# Patient Record
Sex: Female | Born: 1950
Health system: Southern US, Community
[De-identification: ages and names within clinical notes are randomized; demographics above are authoritative.]

## PROBLEM LIST (undated history)

## (undated) DIAGNOSIS — M8580 Other specified disorders of bone density and structure, unspecified site: Secondary | ICD-10-CM

## (undated) DIAGNOSIS — J45909 Unspecified asthma, uncomplicated: Secondary | ICD-10-CM

## (undated) DIAGNOSIS — E785 Hyperlipidemia, unspecified: Secondary | ICD-10-CM

## (undated) DIAGNOSIS — I10 Essential (primary) hypertension: Secondary | ICD-10-CM

## (undated) DIAGNOSIS — E079 Disorder of thyroid, unspecified: Secondary | ICD-10-CM

## (undated) DIAGNOSIS — I739 Peripheral vascular disease, unspecified: Secondary | ICD-10-CM

## (undated) DIAGNOSIS — B029 Zoster without complications: Secondary | ICD-10-CM

## (undated) DIAGNOSIS — N6009 Solitary cyst of unspecified breast: Secondary | ICD-10-CM

## (undated) DIAGNOSIS — K219 Gastro-esophageal reflux disease without esophagitis: Secondary | ICD-10-CM

## (undated) HISTORY — DX: Zoster without complications: B02.9

## (undated) HISTORY — PX: TUBAL LIGATION: SHX77

## (undated) HISTORY — DX: Peripheral vascular disease, unspecified: I73.9

## (undated) HISTORY — DX: Other specified disorders of bone density and structure, unspecified site: M85.80

## (undated) HISTORY — DX: Solitary cyst of unspecified breast: N60.09

## (undated) HISTORY — DX: Essential (primary) hypertension: I10

## (undated) HISTORY — PX: ABDOMINAL HYSTERECTOMY: SHX81

## (undated) HISTORY — DX: Gastro-esophageal reflux disease without esophagitis: K21.9

## (undated) HISTORY — DX: Unspecified asthma, uncomplicated: J45.909

## (undated) HISTORY — DX: Disorder of thyroid, unspecified: E07.9

## (undated) HISTORY — PX: BREAST SURGERY: SHX581

## (undated) HISTORY — PX: ARM AMPUTATION: SUR21

## (undated) HISTORY — DX: Hyperlipidemia, unspecified: E78.5

---

## 1999-09-15 ENCOUNTER — Encounter: Admission: RE | Admit: 1999-09-15 | Discharge: 1999-12-14 | Payer: Self-pay | Admitting: Plastic Surgery

## 2000-05-04 HISTORY — PX: OTHER SURGICAL HISTORY: SHX169

## 2000-09-29 ENCOUNTER — Encounter: Admission: RE | Admit: 2000-09-29 | Discharge: 2000-10-26 | Payer: Self-pay | Admitting: Plastic Surgery

## 2000-11-30 ENCOUNTER — Other Ambulatory Visit: Admission: RE | Admit: 2000-11-30 | Discharge: 2000-11-30 | Payer: Self-pay

## 2005-12-22 ENCOUNTER — Ambulatory Visit: Payer: Self-pay | Admitting: Internal Medicine

## 2006-01-21 ENCOUNTER — Ambulatory Visit: Payer: Self-pay | Admitting: Internal Medicine

## 2006-05-25 ENCOUNTER — Ambulatory Visit: Payer: Self-pay | Admitting: Internal Medicine

## 2006-06-24 ENCOUNTER — Other Ambulatory Visit: Admission: RE | Admit: 2006-06-24 | Discharge: 2006-06-24 | Payer: Self-pay | Admitting: Family Medicine

## 2006-07-03 LAB — HM COLONOSCOPY: HM Colonoscopy: NORMAL

## 2006-07-13 ENCOUNTER — Ambulatory Visit: Payer: Self-pay | Admitting: Gastroenterology

## 2006-07-27 ENCOUNTER — Ambulatory Visit: Payer: Self-pay | Admitting: Gastroenterology

## 2006-07-27 ENCOUNTER — Ambulatory Visit (HOSPITAL_COMMUNITY): Admission: RE | Admit: 2006-07-27 | Discharge: 2006-07-27 | Payer: Self-pay | Admitting: Gastroenterology

## 2006-09-22 ENCOUNTER — Ambulatory Visit: Payer: Self-pay | Admitting: Internal Medicine

## 2007-03-25 ENCOUNTER — Ambulatory Visit: Payer: Self-pay | Admitting: Internal Medicine

## 2009-08-07 ENCOUNTER — Ambulatory Visit: Payer: Self-pay | Admitting: Vascular Surgery

## 2010-08-27 ENCOUNTER — Encounter: Payer: Self-pay | Admitting: Nurse Practitioner

## 2010-08-27 DIAGNOSIS — K219 Gastro-esophageal reflux disease without esophagitis: Secondary | ICD-10-CM | POA: Insufficient documentation

## 2010-08-27 DIAGNOSIS — E039 Hypothyroidism, unspecified: Secondary | ICD-10-CM | POA: Insufficient documentation

## 2010-08-27 DIAGNOSIS — I10 Essential (primary) hypertension: Secondary | ICD-10-CM

## 2010-08-27 DIAGNOSIS — J45909 Unspecified asthma, uncomplicated: Secondary | ICD-10-CM | POA: Insufficient documentation

## 2010-08-27 DIAGNOSIS — E785 Hyperlipidemia, unspecified: Secondary | ICD-10-CM | POA: Insufficient documentation
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
## 2012-09-20 ENCOUNTER — Other Ambulatory Visit: Payer: Self-pay | Admitting: Nurse Practitioner

## 2012-09-21 ENCOUNTER — Other Ambulatory Visit: Payer: Self-pay | Admitting: Nurse Practitioner

## 2012-09-21 NOTE — Telephone Encounter (Signed)
Not seen since 01/08/12

## 2012-09-22 ENCOUNTER — Encounter: Payer: Self-pay | Admitting: Physician Assistant

## 2012-09-22 ENCOUNTER — Ambulatory Visit (INDEPENDENT_AMBULATORY_CARE_PROVIDER_SITE_OTHER): Payer: Medicare Other | Admitting: Physician Assistant

## 2012-09-22 ENCOUNTER — Telehealth: Payer: Self-pay | Admitting: Nurse Practitioner

## 2012-09-22 VITALS — BP 134/69 | HR 84 | Temp 97.7°F | Ht 62.0 in | Wt 186.0 lb

## 2012-09-22 DIAGNOSIS — J329 Chronic sinusitis, unspecified: Secondary | ICD-10-CM

## 2012-09-22 MED ORDER — AMOXICILLIN-POT CLAVULANATE 875-125 MG PO TABS: 1.0000 | ORAL_TABLET | Freq: Two times a day (BID) | ORAL | Status: DC

## 2012-09-22 NOTE — Telephone Encounter (Signed)
LAST LABS 9/13 

## 2012-09-22 NOTE — Progress Notes (Signed)
Subjective:     Patient ID: Karla Maxwell, female   DOB: [birth year], 62 y.o.   MRN: [id number]  Sinusitis This is a new problem. The current episode started in the past 7 days. The problem has been gradually worsening since onset. The maximum temperature recorded prior to her arrival was 100 - 100.9 F. The fever has been present for 1 to 2 days. Her pain is at a severity of 5/10. The pain is mild. Associated symptoms include chills, congestion, coughing, ear pain, headaches, neck pain, sinus pressure and a sore throat. Past treatments include antibiotics (ATB form dentist and antihistamines). The treatment provided mild relief.  Pt also with upper tooth pain   Review of Systems  Constitutional: Positive for chills.  HENT: Positive for ear pain, congestion, sore throat, neck pain and sinus pressure.   Respiratory: Positive for cough.   Neurological: Positive for headaches.  All other systems reviewed and are negative.       Objective:   Physical Exam  Constitutional: She appears well-developed and well-nourished.  HENT:  Right Ear: External ear normal.  Left Ear: External ear normal.  Mouth/Throat: Oropharynx is clear and moist. No oropharyngeal exudate.  + TTP maxillary sinus  Neck: Neck supple. No tracheal deviation present.  Cardiovascular: Normal rate, regular rhythm and normal heart sounds.   Pulmonary/Chest: Effort normal and breath sounds normal.  Lymphadenopathy:    She has no cervical adenopathy.       Assessment:     1. Sinusitis        Plan:     Augmentin rx Cont with antihist and add decongest. F/U prn

## 2012-09-22 NOTE — Patient Instructions (Signed)

## 2012-09-22 NOTE — Telephone Encounter (Signed)
PT. GIVEN APPT.

## 2012-10-17 ENCOUNTER — Telehealth: Payer: Self-pay | Admitting: Nurse Practitioner

## 2012-10-17 NOTE — Telephone Encounter (Signed)
appt made

## 2012-10-18 ENCOUNTER — Encounter: Payer: Self-pay | Admitting: Nurse Practitioner

## 2012-10-18 ENCOUNTER — Ambulatory Visit (INDEPENDENT_AMBULATORY_CARE_PROVIDER_SITE_OTHER): Payer: Medicare Other | Admitting: Nurse Practitioner

## 2012-10-18 VITALS — BP 115/61 | HR 65 | Temp 97.8°F | Ht 62.0 in | Wt 184.0 lb

## 2012-10-18 DIAGNOSIS — E039 Hypothyroidism, unspecified: Secondary | ICD-10-CM

## 2012-10-18 DIAGNOSIS — K219 Gastro-esophageal reflux disease without esophagitis: Secondary | ICD-10-CM

## 2012-10-18 DIAGNOSIS — I10 Essential (primary) hypertension: Secondary | ICD-10-CM

## 2012-10-18 DIAGNOSIS — J449 Chronic obstructive pulmonary disease, unspecified: Secondary | ICD-10-CM

## 2012-10-18 DIAGNOSIS — E785 Hyperlipidemia, unspecified: Secondary | ICD-10-CM

## 2012-10-18 LAB — THYROID PANEL WITH TSH
Free Thyroxine Index: 3.5 (ref 1.0–3.9)
TSH: 2.485 u[IU]/mL (ref 0.350–4.500)

## 2012-10-18 MED ORDER — LEVOTHYROXINE SODIUM 50 MCG PO TABS: 50.0000 ug | ORAL_TABLET | Freq: Every day | ORAL | Status: DC

## 2012-10-18 MED ORDER — MONTELUKAST SODIUM 10 MG PO TABS: 10.0000 mg | ORAL_TABLET | Freq: Every day | ORAL | Status: DC

## 2012-10-18 MED ORDER — FENOFIBRATE MICRONIZED 130 MG PO CAPS: 130.0000 mg | ORAL_CAPSULE | Freq: Every day | ORAL | Status: DC

## 2012-10-18 MED ORDER — OMEPRAZOLE 40 MG PO CPDR
40.0000 mg | DELAYED_RELEASE_CAPSULE | Freq: Every day | ORAL | Status: DC
Start: 2012-10-18 — End: 2013-04-14

## 2012-10-18 MED ORDER — BUDESONIDE-FORMOTEROL FUMARATE 160-4.5 MCG/ACT IN AERO: 2.0000 | INHALATION_SPRAY | Freq: Two times a day (BID) | RESPIRATORY_TRACT | Status: DC

## 2012-10-18 MED ORDER — NIACIN ER (ANTIHYPERLIPIDEMIC) 500 MG PO TBCR: 500.0000 mg | EXTENDED_RELEASE_TABLET | Freq: Every day | ORAL | Status: DC

## 2012-10-18 MED ORDER — AMLODIPINE BESYLATE 5 MG PO TABS: 5.0000 mg | ORAL_TABLET | Freq: Every day | ORAL | Status: DC

## 2012-10-18 MED ORDER — ROSUVASTATIN CALCIUM 20 MG PO TABS: 20.0000 mg | ORAL_TABLET | Freq: Every day | ORAL | Status: DC

## 2012-10-18 NOTE — Progress Notes (Signed)
Subjective:    Patient ID: Karla Maxwell, female    DOB: December 28, 1950, 62 y.o.   MRN: [id number]  Hyperlipidemia This is a chronic problem. The current episode started more than 1 year ago. The problem is controlled. Recent lipid tests were reviewed and are normal. Pertinent negatives include no chest pain, focal sensory loss, focal weakness, leg pain, myalgias or shortness of breath. Current antihyperlipidemic treatment includes statins and fibric acid derivatives. The current treatment provides moderate improvement of lipids. Compliance problems include adherence to exercise.  Risk factors for coronary artery disease include hypertension, obesity and post-menopausal.  Hypertension This is a chronic problem. The current episode started more than 1 year ago. The problem has been resolved since onset. The problem is controlled. Pertinent negatives include no blurred vision, chest pain, headaches, malaise/fatigue, orthopnea, palpitations, peripheral edema or shortness of breath. There are no associated agents to hypertension. Risk factors for coronary artery disease include dyslipidemia, obesity and post-menopausal state. Past treatments include calcium channel blockers. The current treatment provides significant improvement. Compliance problems include exercise.  Hypertensive end-organ damage includes a thyroid problem.  Thyroid Problem Presents for follow-up (hypothyrpidism) visit. Patient reports no anxiety, depressed mood, diarrhea, dry skin, fatigue, heat intolerance, leg swelling, palpitations, visual change, weight gain or weight loss. The symptoms have been stable. Her past medical history is significant for hyperlipidemia.   COPD Symbicort and singulair daily- Occassional smoker- 3-5 a day.  Review of Systems  Constitutional: Negative for weight loss, weight gain, malaise/fatigue and fatigue.  Eyes: Negative for blurred vision.  Respiratory: Negative for shortness of breath.   Cardiovascular:  Negative for chest pain, palpitations and orthopnea.  Gastrointestinal: Negative for diarrhea.  Endocrine: Negative for heat intolerance.  Musculoskeletal: Negative for myalgias.  Neurological: Negative for focal weakness and headaches.       Objective:   Physical Exam  Constitutional: She is oriented to person, place, and time. She appears well-developed and well-nourished.  HENT:  Nose: Nose normal.  Mouth/Throat: Oropharynx is clear and moist.  Eyes: EOM are normal.  Neck: Trachea normal, normal range of motion and full passive range of motion without pain. Neck supple. No JVD present. Carotid bruit is not present. No thyromegaly present.  Cardiovascular: Normal rate, regular rhythm, normal heart sounds and intact distal pulses.  Exam reveals no gallop and no friction rub.   No murmur heard. Pulmonary/Chest: Effort normal and breath sounds normal.  Abdominal: Soft. Bowel sounds are normal. She exhibits no distension and no mass. There is no tenderness.  Musculoskeletal: Normal range of motion. She exhibits edema (1+ pitting edema bil).  Lymphadenopathy:    She has no cervical adenopathy.  Neurological: She is alert and oriented to person, place, and time. She has normal reflexes.  Skin: Skin is warm and dry.  Psychiatric: She has a normal mood and affect. Her behavior is normal. Judgment and thought content normal.    BP 115/61  Pulse 65  Temp(Src) 97.8 F (36.6 C) (Oral)  Ht 5\' 2"  (1.575 m)  Wt 184 lb (83.462 kg)  BMI 33.65 kg/m2       Assessment & Plan:   1. Hypertension   2. Hyperlipidemia   3. Hypothyroidism   4. GERD (gastroesophageal reflux disease)   5. COPD with asthma    Orders Placed This Encounter  Procedures  . COMPLETE METABOLIC PANEL WITH GFR  . NMR Lipoprofile with Lipids  . Thyroid Panel With TSH   Meds ordered this encounter  Medications  .  amLODipine (NORVASC) 5 MG tablet    Sig: Take 1 tablet (5 mg total) by mouth daily.    Dispense:  30  tablet    Refill:  5    Order Specific Question:  Supervising Provider    Answer:  Ernestina Penna [[id number]]  . budesonide-formoterol (SYMBICORT) 160-4.5 MCG/ACT inhaler    Sig: Inhale 2 puffs into the lungs 2 (two) times daily.    Dispense:  1 Inhaler    Refill:  5    Order Specific Question:  Supervising Provider    Answer:  Ernestina Penna [[id number]]  . rosuvastatin (CRESTOR) 20 MG tablet    Sig: Take 1 tablet (20 mg total) by mouth daily.    Dispense:  30 tablet    Refill:  5    NTBS    Order Specific Question:  Supervising Provider    Answer:  Ernestina Penna [[id number]]  . fenofibrate micronized (ANTARA) 130 MG capsule    Sig: Take 1 capsule (130 mg total) by mouth daily before breakfast.    Dispense:  30 capsule    Refill:  5    NTBS    Order Specific Question:  Supervising Provider    Answer:  Ernestina Penna [[id number]]  . levothyroxine (SYNTHROID, LEVOTHROID) 50 MCG tablet    Sig: Take 1 tablet (50 mcg total) by mouth daily.    Dispense:  30 tablet    Refill:  5    Order Specific Question:  Supervising Provider    Answer:  Ernestina Penna [[id number]]  . montelukast (SINGULAIR) 10 MG tablet    Sig: Take 1 tablet (10 mg total) by mouth at bedtime.    Dispense:  30 tablet    Refill:  5    Order Specific Question:  Supervising Provider    Answer:  Ernestina Penna [[id number]]  . niacin (NIASPAN) 500 MG CR tablet    Sig: Take 1 tablet (500 mg total) by mouth at bedtime.    Dispense:  30 tablet    Refill:  5    Order Specific Question:  Supervising Provider    Answer:  Ernestina Penna [[id number]]  . omeprazole (PRILOSEC) 40 MG capsule    Sig: Take 1 capsule (40 mg total) by mouth daily.    Dispense:  30 capsule    Refill:  5    Order Specific Question:  Supervising Provider    Answer:  Ernestina Penna [[id number]]   Continue all meds Labs pending Diet and exercise encouraged  Mary-Margaret Daphine Deutscher, FNP

## 2012-10-18 NOTE — Patient Instructions (Signed)

## 2012-10-19 LAB — COMPLETE METABOLIC PANEL WITH GFR
AST: 18 U/L (ref 0–37)
Albumin: 4.8 g/dL (ref 3.5–5.2)
BUN: 19 mg/dL (ref 6–23)
CO2: 26 mEq/L (ref 19–32)
Calcium: 10.1 mg/dL (ref 8.4–10.5)
Chloride: 103 mEq/L (ref 96–112)
Creat: 0.95 mg/dL (ref 0.50–1.10)
GFR, Est African American: 75 mL/min
Potassium: 4.6 mEq/L (ref 3.5–5.3)

## 2012-10-19 LAB — NMR LIPOPROFILE WITH LIPIDS
Cholesterol, Total: 143 mg/dL (ref <200)
HDL Particle Number: 39.7 umol/L (ref >=30.5)
LDL Particle Number: 1314 nmol/L — ABNORMAL HIGH (ref <1000)
LDL Size: 20 nm — ABNORMAL LOW (ref >20.5)
Large VLDL-P: 4.3 nmol/L — ABNORMAL HIGH (ref <=2.7)
Small LDL Particle Number: 846 nmol/L — ABNORMAL HIGH (ref <=527)
VLDL Size: 46.4 nm (ref <=46.6)

## 2013-04-14 ENCOUNTER — Encounter: Payer: Self-pay | Admitting: Nurse Practitioner

## 2013-04-14 ENCOUNTER — Ambulatory Visit (INDEPENDENT_AMBULATORY_CARE_PROVIDER_SITE_OTHER): Payer: Medicare Other | Admitting: Nurse Practitioner

## 2013-04-14 VITALS — BP 137/73 | HR 91 | Temp 97.5°F | Ht 62.0 in | Wt 187.0 lb

## 2013-04-14 DIAGNOSIS — J4489 Other specified chronic obstructive pulmonary disease: Secondary | ICD-10-CM

## 2013-04-14 DIAGNOSIS — E039 Hypothyroidism, unspecified: Secondary | ICD-10-CM

## 2013-04-14 DIAGNOSIS — K219 Gastro-esophageal reflux disease without esophagitis: Secondary | ICD-10-CM

## 2013-04-14 DIAGNOSIS — J45909 Unspecified asthma, uncomplicated: Secondary | ICD-10-CM

## 2013-04-14 DIAGNOSIS — J449 Chronic obstructive pulmonary disease, unspecified: Secondary | ICD-10-CM

## 2013-04-14 DIAGNOSIS — E785 Hyperlipidemia, unspecified: Secondary | ICD-10-CM

## 2013-04-14 DIAGNOSIS — I10 Essential (primary) hypertension: Secondary | ICD-10-CM

## 2013-04-14 MED ORDER — FENOFIBRATE MICRONIZED 130 MG PO CAPS: 130.0000 mg | ORAL_CAPSULE | Freq: Every day | ORAL | Status: DC

## 2013-04-14 MED ORDER — ROSUVASTATIN CALCIUM 20 MG PO TABS: 20.0000 mg | ORAL_TABLET | Freq: Every day | ORAL | Status: DC

## 2013-04-14 MED ORDER — OMEPRAZOLE 40 MG PO CPDR: 40.0000 mg | DELAYED_RELEASE_CAPSULE | Freq: Every day | ORAL | Status: DC

## 2013-04-14 MED ORDER — LEVOTHYROXINE SODIUM 50 MCG PO TABS: 50.0000 ug | ORAL_TABLET | Freq: Every day | ORAL | Status: DC

## 2013-04-14 MED ORDER — AMLODIPINE BESYLATE 5 MG PO TABS: 5.0000 mg | ORAL_TABLET | Freq: Every day | ORAL | Status: DC

## 2013-04-14 MED ORDER — MONTELUKAST SODIUM 10 MG PO TABS: 10.0000 mg | ORAL_TABLET | Freq: Every day | ORAL | Status: DC

## 2013-04-14 MED ORDER — BUDESONIDE-FORMOTEROL FUMARATE 160-4.5 MCG/ACT IN AERO: 2.0000 | INHALATION_SPRAY | Freq: Two times a day (BID) | RESPIRATORY_TRACT | Status: DC

## 2013-04-14 NOTE — Progress Notes (Signed)
Subjective:    Patient ID: Karla Maxwell, female    DOB: 1951-04-18, 62 y.o.   MRN: [id number]  Patient here today for follow up of chronic medical problem- no changes since last visit- no complaints.  Hyperlipidemia This is a chronic problem. The current episode started more than 1 year ago. The problem is controlled. Recent lipid tests were reviewed and are normal. Pertinent negatives include no chest pain, focal sensory loss, focal weakness, leg pain, myalgias or shortness of breath. Current antihyperlipidemic treatment includes statins and fibric acid derivatives. The current treatment provides moderate improvement of lipids. Compliance problems include adherence to exercise.  Risk factors for coronary artery disease include hypertension, obesity and post-menopausal.  Hypertension This is a chronic problem. The current episode started more than 1 year ago. The problem has been resolved since onset. The problem is controlled. Pertinent negatives include no blurred vision, chest pain, headaches, malaise/fatigue, orthopnea, palpitations, peripheral edema or shortness of breath. There are no associated agents to hypertension. Risk factors for coronary artery disease include dyslipidemia, obesity and post-menopausal state. Past treatments include calcium channel blockers. The current treatment provides significant improvement. Compliance problems include exercise.  Hypertensive end-organ damage includes a thyroid problem.  Thyroid Problem Presents for follow-up (hypothyrpidism) visit. Patient reports no anxiety, depressed mood, diarrhea, dry skin, fatigue, heat intolerance, leg swelling, palpitations, visual change, weight gain or weight loss. The symptoms have been stable. Her past medical history is significant for hyperlipidemia.  bronchial asthma Symbicort and singulair daily- Occassional smoker- 3-5 a day.  Review of Systems  Constitutional: Negative for weight loss, weight gain, malaise/fatigue  and fatigue.  Eyes: Negative for blurred vision.  Respiratory: Negative for shortness of breath.   Cardiovascular: Negative for chest pain, palpitations and orthopnea.  Gastrointestinal: Negative for diarrhea.  Endocrine: Negative for heat intolerance.  Musculoskeletal: Negative for myalgias.  Neurological: Negative for focal weakness and headaches.       Objective:   Physical Exam  Constitutional: She is oriented to person, place, and time. She appears well-developed and well-nourished.  HENT:  Nose: Nose normal.  Mouth/Throat: Oropharynx is clear and moist.  Eyes: EOM are normal.  Neck: Trachea normal, normal range of motion and full passive range of motion without pain. Neck supple. No JVD present. Carotid bruit is not present. No thyromegaly present.  Cardiovascular: Normal rate, regular rhythm, normal heart sounds and intact distal pulses.  Exam reveals no gallop and no friction rub.   No murmur heard. Pulmonary/Chest: Effort normal and breath sounds normal.  Abdominal: Soft. Bowel sounds are normal. She exhibits no distension and no mass. There is no tenderness.  Musculoskeletal: Normal range of motion. She exhibits edema (1+ pitting edema bil).  Lymphadenopathy:    She has no cervical adenopathy.  Neurological: She is alert and oriented to person, place, and time. She has normal reflexes.  Skin: Skin is warm and dry.  Psychiatric: She has a normal mood and affect. Her behavior is normal. Judgment and thought content normal.    BP 137/73  Pulse 91  Temp(Src) 97.5 F (36.4 C) (Oral)  Ht 5\' 2"  (1.575 m)  Wt 187 lb (84.823 kg)  BMI 34.19 kg/m2       Assessment & Plan:   1. Hypothyroidism   2. Hypertension   3. Hyperlipidemia   4. GERD (gastroesophageal reflux disease)   5. Asthma, chronic   6. COPD with asthma    Orders Placed This Encounter  Procedures  . CMP14+EGFR  . NMR,  lipoprofile  . Thyroid Panel With TSH   Meds ordered this encounter  Medications   . amLODipine (NORVASC) 5 MG tablet    Sig: Take 1 tablet (5 mg total) by mouth daily.    Dispense:  90 tablet    Refill:  1    Order Specific Question:  Supervising Provider    Answer:  Ernestina Penna [[id number]]  . budesonide-formoterol (SYMBICORT) 160-4.5 MCG/ACT inhaler    Sig: Inhale 2 puffs into the lungs 2 (two) times daily.    Dispense:  1 Inhaler    Refill:  5    Order Specific Question:  Supervising Provider    Answer:  Ernestina Penna [[id number]]  . levothyroxine (SYNTHROID, LEVOTHROID) 50 MCG tablet    Sig: Take 1 tablet (50 mcg total) by mouth daily.    Dispense:  90 tablet    Refill:  1    Order Specific Question:  Supervising Provider    Answer:  Ernestina Penna [[id number]]  . montelukast (SINGULAIR) 10 MG tablet    Sig: Take 1 tablet (10 mg total) by mouth at bedtime.    Dispense:  90 tablet    Refill:  1    Order Specific Question:  Supervising Provider    Answer:  Ernestina Penna [[id number]]  . omeprazole (PRILOSEC) 40 MG capsule    Sig: Take 1 capsule (40 mg total) by mouth daily.    Dispense:  90 capsule    Refill:  1    Order Specific Question:  Supervising Provider    Answer:  Ernestina Penna [[id number]]  . rosuvastatin (CRESTOR) 20 MG tablet    Sig: Take 1 tablet (20 mg total) by mouth daily.    Dispense:  30 tablet    Refill:  5    Order Specific Question:  Supervising Provider    Answer:  Ernestina Penna [[id number]]  . fenofibrate micronized (ANTARA) 130 MG capsule    Sig: Take 1 capsule (130 mg total) by mouth daily before breakfast.    Dispense:  30 capsule    Refill:  5    Order Specific Question:  Supervising Provider    Answer:  Deborra Medina    Continue all meds Labs pending Diet and exercise encouraged Health maintenance reviewed Follow up in 3 month Refuses immunizations Mary-Margaret Daphine Deutscher, FNP

## 2013-04-14 NOTE — Patient Instructions (Signed)

## 2013-04-16 LAB — NMR, LIPOPROFILE
Cholesterol: 233 mg/dL — ABNORMAL HIGH (ref <200)
HDL Cholesterol by NMR: 45 mg/dL (ref >=40)
HDL Particle Number: 38.1 umol/L (ref >=30.5)
LDLC SERPL CALC-MCNC: 144 mg/dL — ABNORMAL HIGH (ref <100)
Triglycerides by NMR: 219 mg/dL — ABNORMAL HIGH (ref <150)

## 2013-04-16 LAB — CMP14+EGFR
Albumin: 4.6 g/dL (ref 3.6–4.8)
BUN: 14 mg/dL (ref 8–27)
CO2: 23 mmol/L (ref 18–29)
Calcium: 10.2 mg/dL (ref 8.6–10.2)
Creatinine, Ser: 0.95 mg/dL (ref 0.57–1.00)
GFR calc non Af Amer: 64 mL/min/{1.73_m2} (ref >59)
Globulin, Total: 2.4 g/dL (ref 1.5–4.5)

## 2013-04-16 LAB — THYROID PANEL WITH TSH: Free Thyroxine Index: 2.4 (ref 1.2–4.9)

## 2013-04-16 LAB — SPECIMEN STATUS REPORT

## 2013-04-25 ENCOUNTER — Other Ambulatory Visit: Payer: Self-pay | Admitting: Nurse Practitioner

## 2013-10-20 ENCOUNTER — Other Ambulatory Visit: Payer: Self-pay | Admitting: Nurse Practitioner

## 2013-10-23 ENCOUNTER — Other Ambulatory Visit: Payer: Self-pay | Admitting: Nurse Practitioner

## 2013-11-17 ENCOUNTER — Ambulatory Visit (INDEPENDENT_AMBULATORY_CARE_PROVIDER_SITE_OTHER): Payer: Medicare Other | Admitting: Nurse Practitioner

## 2013-11-17 ENCOUNTER — Encounter: Payer: Self-pay | Admitting: Nurse Practitioner

## 2013-11-17 VITALS — BP 142/68 | HR 78 | Temp 98.3°F | Ht 62.0 in | Wt 187.0 lb

## 2013-11-17 DIAGNOSIS — J45909 Unspecified asthma, uncomplicated: Secondary | ICD-10-CM

## 2013-11-17 DIAGNOSIS — E039 Hypothyroidism, unspecified: Secondary | ICD-10-CM

## 2013-11-17 DIAGNOSIS — Z713 Dietary counseling and surveillance: Secondary | ICD-10-CM

## 2013-11-17 DIAGNOSIS — E785 Hyperlipidemia, unspecified: Secondary | ICD-10-CM

## 2013-11-17 DIAGNOSIS — J4540 Moderate persistent asthma, uncomplicated: Secondary | ICD-10-CM

## 2013-11-17 DIAGNOSIS — I10 Essential (primary) hypertension: Secondary | ICD-10-CM

## 2013-11-17 DIAGNOSIS — K219 Gastro-esophageal reflux disease without esophagitis: Secondary | ICD-10-CM

## 2013-11-17 DIAGNOSIS — Z6837 Body mass index (BMI) 37.0-37.9, adult: Secondary | ICD-10-CM

## 2013-11-17 MED ORDER — BUDESONIDE-FORMOTEROL FUMARATE 160-4.5 MCG/ACT IN AERO: INHALATION_SPRAY | RESPIRATORY_TRACT | Status: DC

## 2013-11-17 MED ORDER — NIACIN ER (ANTIHYPERLIPIDEMIC) 500 MG PO TBCR: EXTENDED_RELEASE_TABLET | ORAL | Status: DC

## 2013-11-17 MED ORDER — FENOFIBRATE MICRONIZED 130 MG PO CAPS: ORAL_CAPSULE | ORAL | Status: DC

## 2013-11-17 MED ORDER — OMEPRAZOLE 40 MG PO CPDR: DELAYED_RELEASE_CAPSULE | ORAL | Status: DC

## 2013-11-17 MED ORDER — LEVOTHYROXINE SODIUM 50 MCG PO TABS: 50.0000 ug | ORAL_TABLET | Freq: Every day | ORAL | Status: DC

## 2013-11-17 MED ORDER — ROSUVASTATIN CALCIUM 20 MG PO TABS: ORAL_TABLET | ORAL | Status: DC

## 2013-11-17 MED ORDER — AMLODIPINE BESYLATE 5 MG PO TABS: ORAL_TABLET | ORAL | Status: DC

## 2013-11-17 MED ORDER — MONTELUKAST SODIUM 10 MG PO TABS: ORAL_TABLET | ORAL | Status: DC

## 2013-11-17 NOTE — Patient Instructions (Signed)

## 2013-11-17 NOTE — Progress Notes (Signed)
Subjective:    Patient ID: Karla Maxwell, female    DOB: 1950/12/15, 63 y.o.   MRN: [id number]  Patient here today for follow up of chronic medical problem- no changes since last visit- no complaints.  Hyperlipidemia This is a chronic problem. The current episode started more than 1 year ago. The problem is controlled. Recent lipid tests were reviewed and are normal. Pertinent negatives include no chest pain, focal sensory loss, focal weakness, leg pain, myalgias or shortness of breath. Current antihyperlipidemic treatment includes statins and fibric acid derivatives. The current treatment provides moderate improvement of lipids. Compliance problems include adherence to exercise.  Risk factors for coronary artery disease include hypertension, obesity and post-menopausal.  Hypertension This is a chronic problem. The current episode started more than 1 year ago. The problem has been resolved since onset. The problem is controlled. Pertinent negatives include no blurred vision, chest pain, headaches, malaise/fatigue, orthopnea, palpitations, peripheral edema or shortness of breath. There are no associated agents to hypertension. Risk factors for coronary artery disease include dyslipidemia, obesity and post-menopausal state. Past treatments include calcium channel blockers. The current treatment provides significant improvement. Compliance problems include exercise.  Hypertensive end-organ damage includes a thyroid problem.  Gastrophageal Reflux She reports no chest pain. Pertinent negatives include no fatigue or weight loss.  Thyroid Problem Presents for follow-up (hypothyrpidism) visit. Patient reports no anxiety, depressed mood, diarrhea, dry skin, fatigue, heat intolerance, leg swelling, palpitations, visual change, weight gain or weight loss. The symptoms have been stable. Her past medical history is significant for hyperlipidemia.  bronchial asthma Symbicort and singulair daily- Occassional  smoker- 3-5 a day. Has not needed albuterol in a few months  Review of Systems  Constitutional: Negative for weight loss, weight gain, malaise/fatigue and fatigue.  Eyes: Negative for blurred vision.  Respiratory: Negative for shortness of breath.   Cardiovascular: Negative for chest pain, palpitations and orthopnea.  Gastrointestinal: Negative for diarrhea.  Endocrine: Negative for heat intolerance.  Musculoskeletal: Negative for myalgias.  Neurological: Negative for focal weakness and headaches.       Objective:   Physical Exam  Constitutional: She is oriented to person, place, and time. She appears well-developed and well-nourished.  HENT:  Nose: Nose normal.  Mouth/Throat: Oropharynx is clear and moist.  Eyes: EOM are normal.  Neck: Trachea normal, normal range of motion and full passive range of motion without pain. Neck supple. No JVD present. Carotid bruit is not present. No thyromegaly present.  Cardiovascular: Normal rate, regular rhythm, normal heart sounds and intact distal pulses.  Exam reveals no gallop and no friction rub.   No murmur heard. Pulmonary/Chest: Effort normal and breath sounds normal.  Abdominal: Soft. Bowel sounds are normal. She exhibits no distension and no mass. There is no tenderness.  Musculoskeletal: Normal range of motion. She exhibits edema (1+ pitting edema bil).  Lymphadenopathy:    She has no cervical adenopathy.  Neurological: She is alert and oriented to person, place, and time. She has normal reflexes.  Skin: Skin is warm and dry.  Psychiatric: She has a normal mood and affect. Her behavior is normal. Judgment and thought content normal.    BP 142/68  Pulse 78  Temp(Src) 98.3 F (36.8 C) (Oral)  Ht '5\' 2"'  (1.575 m)  Wt 187 lb (84.823 kg)  BMI 34.19 kg/m2       Assessment & Plan:    1. Essential hypertension   2. Hyperlipidemia   3. Hypothyroidism, unspecified hypothyroidism type   4. Gastroesophageal  reflux disease,  esophagitis presence not specified   5. Bronchial asthma, moderate persistent, uncomplicated   6. BMI 37.0-37.9, adult   7. Weight loss counseling, encounter for     Orders Placed This Encounter  Procedures  . CMP14+EGFR  . NMR, lipoprofile  . Thyroid Panel With TSH   Meds ordered this encounter  Medications  . budesonide-formoterol (SYMBICORT) 160-4.5 MCG/ACT inhaler    Sig: INHALE 2 PUFFS BY MOUTH INTO THE LUNGS TWICE A DAY    Dispense:  11 g    Refill:  0    Order Specific Question:  Supervising Provider    Answer:  Chipper Herb [[id number]]  . omeprazole (PRILOSEC) 40 MG capsule    Sig: TAKE ONE CAPSULE BY MOUTH ONE TIME DAILY    Dispense:  90 capsule    Refill:  1    Order Specific Question:  Supervising Provider    Answer:  Chipper Herb [[id number]]  . montelukast (SINGULAIR) 10 MG tablet    Sig: TAKE ONE TABLET BY MOUTH AT BEDTIME    Dispense:  90 tablet    Refill:  1    Order Specific Question:  Supervising Provider    Answer:  Chipper Herb [[id number]]  . levothyroxine (SYNTHROID, LEVOTHROID) 50 MCG tablet    Sig: Take 1 tablet (50 mcg total) by mouth daily.    Dispense:  90 tablet    Refill:  1    Order Specific Question:  Supervising Provider    Answer:  Chipper Herb [[id number]]  . fenofibrate micronized (ANTARA) 130 MG capsule    Sig: TAKE ONE CAPSULE BY MOUTH ONE TIME DAILY BEFORE BREAKFAST    Dispense:  30 capsule    Refill:  5    Order Specific Question:  Supervising Provider    Answer:  Chipper Herb [[id number]]  . rosuvastatin (CRESTOR) 20 MG tablet    Sig: TAKE ONE TABLET BY MOUTH ONE TIME DAILY    Dispense:  30 tablet    Refill:  5    Order Specific Question:  Supervising Provider    Answer:  Chipper Herb [[id number]]  . amLODipine (NORVASC) 5 MG tablet    Sig: TAKE ONE TABLET BY MOUTH ONE TIME DAILY    Dispense:  90 tablet    Refill:  1    Order Specific Question:  Supervising Provider    Answer:  Chipper Herb [[id number]]  . niacin (NIASPAN) 500 MG CR tablet     Sig: TAKE 1 TABLET (500 MG TOTAL)   BY MOUTH AT BEDTIME.    Dispense:  30 tablet    Refill:  5    Order Specific Question:  Supervising Provider    Answer:  Chipper Herb [[id number]]   Stop smoking Labs pending Health maintenance reviewed Diet and exercise encouraged Continue all meds Follow up  In 3 months   [place], FNP

## 2013-11-18 LAB — CMP14+EGFR
A/G RATIO: 2.2 (ref 1.1–2.5)
ALBUMIN: 4.9 g/dL — AB (ref 3.6–4.8)
ALT: 22 IU/L (ref 0–32)
AST: 25 IU/L (ref 0–40)
Alkaline Phosphatase: 55 IU/L (ref 39–117)
BUN/Creatinine Ratio: 13 (ref 11–26)
BUN: 12 mg/dL (ref 8–27)
CALCIUM: 10.1 mg/dL (ref 8.7–10.3)
CO2: 24 mmol/L (ref 18–29)
CREATININE: 0.9 mg/dL (ref 0.57–1.00)
Chloride: 101 mmol/L (ref 97–108)
GFR calc Af Amer: 79 mL/min/{1.73_m2} (ref >59)
GFR, EST NON AFRICAN AMERICAN: 68 mL/min/{1.73_m2} (ref >59)
GLOBULIN, TOTAL: 2.2 g/dL (ref 1.5–4.5)
Glucose: 105 mg/dL — ABNORMAL HIGH (ref 65–99)
Potassium: 4.9 mmol/L (ref 3.5–5.2)
SODIUM: 142 mmol/L (ref 134–144)
Total Bilirubin: 0.3 mg/dL (ref 0.0–1.2)
Total Protein: 7.1 g/dL (ref 6.0–8.5)

## 2013-11-18 LAB — NMR, LIPOPROFILE
CHOLESTEROL: 137 mg/dL (ref 100–199)
HDL CHOLESTEROL BY NMR: 42 mg/dL (ref >39)
HDL PARTICLE NUMBER: 39.8 umol/L (ref >=30.5)
LDL Particle Number: 1001 nmol/L — ABNORMAL HIGH (ref <1000)
LDL Size: 19.8 nm (ref >20.5)
LDLC SERPL CALC-MCNC: 54 mg/dL (ref 0–99)
LP-IR Score: 71 — ABNORMAL HIGH (ref <=45)
SMALL LDL PARTICLE NUMBER: 756 nmol/L — AB (ref <=527)
Triglycerides by NMR: 205 mg/dL — ABNORMAL HIGH (ref 0–149)

## 2013-11-18 LAB — THYROID PANEL WITH TSH
Free Thyroxine Index: 2.1 (ref 1.2–4.9)
T3 Uptake Ratio: 23 % — ABNORMAL LOW (ref 24–39)
T4 TOTAL: 9.1 ug/dL (ref 4.5–12.0)
TSH: 4.69 u[IU]/mL — AB (ref 0.450–4.500)

## 2013-11-20 ENCOUNTER — Other Ambulatory Visit: Payer: Self-pay | Admitting: Nurse Practitioner

## 2013-11-20 MED ORDER — LEVOTHYROXINE SODIUM 75 MCG PO TABS: 75.0000 ug | ORAL_TABLET | Freq: Every day | ORAL | Status: DC

## 2013-12-05 ENCOUNTER — Encounter: Payer: Self-pay | Admitting: Nurse Practitioner

## 2013-12-05 ENCOUNTER — Ambulatory Visit (INDEPENDENT_AMBULATORY_CARE_PROVIDER_SITE_OTHER): Payer: Medicare Other | Admitting: Nurse Practitioner

## 2013-12-05 VITALS — BP 138/70 | HR 82 | Temp 97.6°F | Ht 62.0 in | Wt 181.0 lb

## 2013-12-05 DIAGNOSIS — Z124 Encounter for screening for malignant neoplasm of cervix: Secondary | ICD-10-CM

## 2013-12-05 DIAGNOSIS — Z01419 Encounter for gynecological examination (general) (routine) without abnormal findings: Secondary | ICD-10-CM

## 2013-12-05 NOTE — Patient Instructions (Signed)
Pap Test A Pap test is a procedure done in a clinic office to evaluate cells that are on the surface of the cervix. The cervix is the lower portion of the uterus and upper portion of the vagina. For some women, the cervical region has the potential to form cancer. With consistent evaluations by your caregiver, this type of cancer can be prevented.  If a Pap test is abnormal, it is most often a result of a previous exposure to human papillomavirus (HPV). HPV is a virus that can infect the cells of the cervix and cause dysplasia. Dysplasia is where the cells no longer look normal. If a woman has been diagnosed with high-grade or severe dysplasia, they are at higher risk of developing cervical cancer. People diagnosed with low-grade dysplasia should still be seen by their caregiver because there is a small chance that low-grade dysplasia could develop into cancer.  LET YOUR CAREGIVER KNOW ABOUT:  Recent sexually transmitted infection (STI) you have had.  Any new sex partners you have had.  History of previous abnormal Pap tests results.  History of previous cervical procedures you have had (colposcopy, biopsy, loop electrosurgical excision procedure [LEEP]).  Concerns you have had regarding unusual vaginal discharge.  History of pelvic pain.  Your use of birth control. BEFORE THE PROCEDURE  Ask your caregiver when to schedule your Pap test. It is best not to be on your period if your caregiver uses a wooden spatula to collect cells or applies cells to a glass slide. Newer techniques are not so sensitive to the timing of a menstrual cycle.  Do not douche or have sexual intercourse for 24 hours before the test.   Do not use vaginal creams or tampons for 24 hours before the test.   Empty your bladder just before the test to lessen any discomfort.  PROCEDURE You will lie on an exam table with your feet in stirrups. A warm metal or plastic instrument (speculum) is placed in your vagina. This  instrument allows your caregiver to see the inside of your vagina and look at your cervix. A small, plastic brush or wooden spatula is then used to collect cervical cells. These cells are placed in a lab specimen container. The cells are looked at under a microscope. A specialist will determine if the cells are normal.  AFTER THE PROCEDURE Make sure to get your test results.If your results come back abnormal, you may need further testing.  Document Released: 07/11/2002 Document Revised: 07/13/2011 Document Reviewed: 04/16/2011 [employer] Patient Information 2015 [employer]. This information is not intended to replace advice given to you by your health care provider. Make sure you discuss any questions you have with your health care provider.  

## 2013-12-05 NOTE — Progress Notes (Addendum)
   Subjective:    Patient ID: Karla Maxwell, female    DOB: [birth year], 63 y.o.   MRN: [id number]  HPI Patient here today for PAP exam- she was seen for regular follow up on 11/17/13. She is doing well today without complaints.    Review of Systems  Constitutional: Negative.   HENT: Negative.   Respiratory: Negative.   Cardiovascular: Negative.   Genitourinary: Negative.   Neurological: Negative.   Psychiatric/Behavioral: Negative.   All other systems reviewed and are negative.      Objective:   Physical Exam  Constitutional: She is oriented to person, place, and time. She appears well-developed and well-nourished.  HENT:  Head: Normocephalic.  Right Ear: Hearing, tympanic membrane, external ear and ear canal normal.  Left Ear: Hearing, tympanic membrane, external ear and ear canal normal.  Nose: Nose normal.  Mouth/Throat: Uvula is midline and oropharynx is clear and moist.  Eyes: Conjunctivae and EOM are normal. Pupils are equal, round, and reactive to light.  Neck: Normal range of motion and full passive range of motion without pain. Neck supple. No JVD present. Carotid bruit is not present. No mass and no thyromegaly present.  Cardiovascular: Normal rate, normal heart sounds and intact distal pulses.   No murmur heard. Pulmonary/Chest: Effort normal and breath sounds normal. Right breast exhibits no inverted nipple, no mass, no nipple discharge, no skin change and no tenderness. Left breast exhibits no inverted nipple, no mass, no nipple discharge, no skin change and no tenderness.  Abdominal: Soft. Bowel sounds are normal. She exhibits no mass. There is no tenderness.  Genitourinary: Vagina normal and uterus normal. No breast swelling, tenderness, discharge or bleeding.  bimanual exam-No adnexal masses or tenderness. Vaginal cuff intact  Musculoskeletal: Normal range of motion.       Arms: Right upper arm amputation  Lymphadenopathy:    She has no cervical adenopathy.    Neurological: She is alert and oriented to person, place, and time.  Skin: Skin is warm and dry.  Psychiatric: She has a normal mood and affect. Her behavior is normal. Judgment and thought content normal.   BP 138/70  Pulse 82  Temp(Src) 97.6 F (36.4 C) (Oral)  Ht 5\' 2"  (1.575 m)  Wt 181 lb (82.101 kg)  BMI 33.10 kg/m2        Assessment & Plan:   1. Encounter for routine gynecological examination   smoking cessation Keep follow up appointment Contnue all meds  Mary-Margaret Hassell Done, FNP

## 2013-12-07 LAB — PAP IG W/ RFLX HPV ASCU: PAP SMEAR COMMENT: 0

## 2014-01-01 ENCOUNTER — Encounter (INDEPENDENT_AMBULATORY_CARE_PROVIDER_SITE_OTHER): Payer: Self-pay

## 2014-01-01 ENCOUNTER — Other Ambulatory Visit (INDEPENDENT_AMBULATORY_CARE_PROVIDER_SITE_OTHER): Payer: Medicare Other

## 2014-01-01 DIAGNOSIS — R7989 Other specified abnormal findings of blood chemistry: Secondary | ICD-10-CM

## 2014-01-02 LAB — TSH: TSH: 1.53 u[IU]/mL (ref 0.450–4.500)

## 2014-03-19 ENCOUNTER — Other Ambulatory Visit: Payer: Self-pay | Admitting: Nurse Practitioner

## 2014-03-19 DIAGNOSIS — E785 Hyperlipidemia, unspecified: Secondary | ICD-10-CM

## 2014-03-19 MED ORDER — FENOFIBRATE MICRONIZED 134 MG PO CAPS: 134.0000 mg | ORAL_CAPSULE | Freq: Every day | ORAL | Status: DC

## 2014-03-28 ENCOUNTER — Other Ambulatory Visit: Payer: Self-pay | Admitting: Nurse Practitioner

## 2014-05-21 ENCOUNTER — Telehealth: Payer: Self-pay | Admitting: Nurse Practitioner

## 2014-05-21 ENCOUNTER — Other Ambulatory Visit: Payer: Self-pay | Admitting: Nurse Practitioner

## 2014-05-21 MED ORDER — FENOFIBRATE 160 MG PO TABS: 160.0000 mg | ORAL_TABLET | Freq: Every day | ORAL | Status: DC

## 2014-05-21 NOTE — Telephone Encounter (Signed)
Please review and advise.

## 2014-05-21 NOTE — Telephone Encounter (Signed)
Pt notified of change Verbalizes understanding 

## 2014-05-21 NOTE — Telephone Encounter (Signed)
Fenofibrate rx changed and sent to pharmacy

## 2014-05-22 ENCOUNTER — Telehealth: Payer: Self-pay | Admitting: Nurse Practitioner

## 2014-05-22 NOTE — Telephone Encounter (Signed)
corrected

## 2014-05-22 NOTE — Addendum Note (Signed)
Addended byCarrolyn Leigh on: 05/22/2014 04:11 PM   Modules accepted: Orders

## 2014-05-26 ENCOUNTER — Other Ambulatory Visit: Payer: Self-pay | Admitting: Nurse Practitioner

## 2014-06-24 ENCOUNTER — Other Ambulatory Visit: Payer: Self-pay | Admitting: Nurse Practitioner

## 2014-06-27 ENCOUNTER — Ambulatory Visit (INDEPENDENT_AMBULATORY_CARE_PROVIDER_SITE_OTHER): Payer: Medicare Other | Admitting: Nurse Practitioner

## 2014-06-27 ENCOUNTER — Encounter: Payer: Self-pay | Admitting: Nurse Practitioner

## 2014-06-27 VITALS — BP 138/84 | HR 84 | Temp 97.5°F | Ht 62.0 in | Wt 188.0 lb

## 2014-06-27 DIAGNOSIS — Z13820 Encounter for screening for osteoporosis: Secondary | ICD-10-CM | POA: Diagnosis not present

## 2014-06-27 DIAGNOSIS — E785 Hyperlipidemia, unspecified: Secondary | ICD-10-CM

## 2014-06-27 DIAGNOSIS — Z780 Asymptomatic menopausal state: Secondary | ICD-10-CM

## 2014-06-27 DIAGNOSIS — I10 Essential (primary) hypertension: Secondary | ICD-10-CM

## 2014-06-27 DIAGNOSIS — E039 Hypothyroidism, unspecified: Secondary | ICD-10-CM

## 2014-06-27 DIAGNOSIS — K219 Gastro-esophageal reflux disease without esophagitis: Secondary | ICD-10-CM | POA: Diagnosis not present

## 2014-06-27 MED ORDER — FENOFIBRATE MICRONIZED 134 MG PO CAPS: 134.0000 mg | ORAL_CAPSULE | Freq: Every day | ORAL | Status: DC

## 2014-06-27 NOTE — Patient Instructions (Signed)
Fat and Cholesterol Control Diet Fat and cholesterol levels in your blood and organs are influenced by your diet. High levels of fat and cholesterol may lead to diseases of the heart, small and large blood vessels, gallbladder, liver, and pancreas. CONTROLLING FAT AND CHOLESTEROL WITH DIET Although exercise and lifestyle factors are important, your diet is key. That is because certain foods are known to raise cholesterol and others to lower it. The goal is to balance foods for their effect on cholesterol and more importantly, to replace saturated and trans fat with other types of fat, such as monounsaturated fat, polyunsaturated fat, and omega-3 fatty acids. On average, a person should consume no more than 15 to 17 g of saturated fat daily. Saturated and trans fats are considered "bad" fats, and they will raise LDL cholesterol. Saturated fats are primarily found in animal products such as meats, butter, and cream. However, that does not mean you need to give up all your favorite foods. Today, there are good tasting, low-fat, low-cholesterol substitutes for most of the things you like to eat. Choose low-fat or nonfat alternatives. Choose round or loin cuts of red meat. These types of cuts are lowest in fat and cholesterol. Chicken (without the skin), fish, veal, and ground turkey breast are great choices. Eliminate fatty meats, such as hot dogs and salami. Even shellfish have little or no saturated fat. Have a 3 oz (85 g) portion when you eat lean meat, poultry, or fish. Trans fats are also called "partially hydrogenated oils." They are oils that have been scientifically manipulated so that they are solid at room temperature resulting in a longer shelf life and improved taste and texture of foods in which they are added. Trans fats are found in stick margarine, some tub margarines, cookies, crackers, and baked goods.  When baking and cooking, oils are a great substitute for butter. The monounsaturated oils are  especially beneficial since it is believed they lower LDL and raise HDL. The oils you should avoid entirely are saturated tropical oils, such as coconut and palm.  Remember to eat a lot from food groups that are naturally free of saturated and trans fat, including fish, fruit, vegetables, beans, grains (barley, rice, couscous, bulgur wheat), and pasta (without cream sauces).  IDENTIFYING FOODS THAT LOWER FAT AND CHOLESTEROL  Soluble fiber may lower your cholesterol. This type of fiber is found in fruits such as apples, vegetables such as broccoli, potatoes, and carrots, legumes such as beans, peas, and lentils, and grains such as barley. Foods fortified with plant sterols (phytosterol) may also lower cholesterol. You should eat at least 2 g per day of these foods for a cholesterol lowering effect.  Read package labels to identify low-saturated fats, trans fat free, and low-fat foods at the supermarket. Select cheeses that have only 2 to 3 g saturated fat per ounce. Use a heart-healthy tub margarine that is free of trans fats or partially hydrogenated oil. When buying baked goods (cookies, crackers), avoid partially hydrogenated oils. Breads and muffins should be made from whole grains (whole-wheat or whole oat flour, instead of "flour" or "enriched flour"). Buy non-creamy canned soups with reduced salt and no added fats.  FOOD PREPARATION TECHNIQUES  Never deep-fry. If you must fry, either stir-fry, which uses very little fat, or use non-stick cooking sprays. When possible, broil, bake, or roast meats, and steam vegetables. Instead of putting butter or margarine on vegetables, use lemon and herbs, applesauce, and cinnamon (for squash and sweet potatoes). Use nonfat   yogurt, salsa, and low-fat dressings for salads.  LOW-SATURATED FAT / LOW-FAT FOOD SUBSTITUTES Meats / Saturated Fat (g)  Avoid: Steak, marbled (3 oz/85 g) / 11 g  Choose: Steak, lean (3 oz/85 g) / 4 g  Avoid: Hamburger (3 oz/85 g) / 7  g  Choose: Hamburger, lean (3 oz/85 g) / 5 g  Avoid: Ham (3 oz/85 g) / 6 g  Choose: Ham, lean cut (3 oz/85 g) / 2.4 g  Avoid: Chicken, with skin, dark meat (3 oz/85 g) / 4 g  Choose: Chicken, skin removed, dark meat (3 oz/85 g) / 2 g  Avoid: Chicken, with skin, light meat (3 oz/85 g) / 2.5 g  Choose: Chicken, skin removed, light meat (3 oz/85 g) / 1 g Dairy / Saturated Fat (g)  Avoid: Whole milk (1 cup) / 5 g  Choose: Low-fat milk, 2% (1 cup) / 3 g  Choose: Low-fat milk, 1% (1 cup) / 1.5 g  Choose: Skim milk (1 cup) / 0.3 g  Avoid: Hard cheese (1 oz/28 g) / 6 g  Choose: Skim milk cheese (1 oz/28 g) / 2 to 3 g  Avoid: Cottage cheese, 4% fat (1 cup) / 6.5 g  Choose: Low-fat cottage cheese, 1% fat (1 cup) / 1.5 g  Avoid: Ice cream (1 cup) / 9 g  Choose: Sherbet (1 cup) / 2.5 g  Choose: Nonfat frozen yogurt (1 cup) / 0.3 g  Choose: Frozen fruit bar / trace  Avoid: Whipped cream (1 tbs) / 3.5 g  Choose: Nondairy whipped topping (1 tbs) / 1 g Condiments / Saturated Fat (g)  Avoid: Mayonnaise (1 tbs) / 2 g  Choose: Low-fat mayonnaise (1 tbs) / 1 g  Avoid: Butter (1 tbs) / 7 g  Choose: Extra light margarine (1 tbs) / 1 g  Avoid: Coconut oil (1 tbs) / 11.8 g  Choose: Olive oil (1 tbs) / 1.8 g  Choose: Corn oil (1 tbs) / 1.7 g  Choose: Safflower oil (1 tbs) / 1.2 g  Choose: Sunflower oil (1 tbs) / 1.4 g  Choose: Soybean oil (1 tbs) / 2.4 g  Choose: Canola oil (1 tbs) / 1 g Document Released: 04/20/2005 Document Revised: 08/15/2012 Document Reviewed: 07/19/2013 [employer] Patient Information 2015 [employer]. This information is not intended to replace advice given to you by your health care provider. Make sure you discuss any questions you have with your health care provider.  

## 2014-06-27 NOTE — Addendum Note (Signed)
Addended by: Rolena Infante on: 06/27/2014 04:05 PM   Modules accepted: Orders

## 2014-06-27 NOTE — Progress Notes (Signed)
Subjective:    Patient ID: Karla Maxwell, female    DOB: 1950/05/14, 64 y.o.   MRN: [id number]  Patient here today for follow up of chronic medical problem- no changes since last visit- no complaints.  Hyperlipidemia This is a chronic problem. The current episode started more than 1 year ago. The problem is uncontrolled. Recent lipid tests were reviewed and are variable. Pertinent negatives include no chest pain, myalgias or shortness of breath. Current antihyperlipidemic treatment includes statins and fibric acid derivatives. The current treatment provides no improvement of lipids. Compliance problems include adherence to diet and adherence to exercise.  Risk factors for coronary artery disease include dyslipidemia, hypertension and post-menopausal.  Hypertension This is a chronic problem. The current episode started more than 1 year ago. The problem is unchanged. The problem is controlled. Pertinent negatives include no chest pain, headaches, palpitations or shortness of breath. Risk factors for coronary artery disease include dyslipidemia, female gender, post-menopausal state, obesity and sedentary lifestyle. Past treatments include calcium channel blockers. The current treatment provides moderate improvement. Compliance problems include diet and exercise.  Hypertensive end-organ damage includes CAD/MI and a thyroid problem.  Gastrophageal Reflux She reports no chest pain. Pertinent negatives include no fatigue.  Thyroid Problem Presents for follow-up (hypothyroidism) visit. Patient reports no diarrhea, fatigue, heat intolerance, palpitations or visual change. Her past medical history is significant for hyperlipidemia.  bronchial asthma Symbicort and singulair daily- Occassional smoker- 3-5 a day. Has not needed albuterol in a few months  Review of Systems  Constitutional: Negative for fatigue.  Respiratory: Negative for shortness of breath.   Cardiovascular: Negative for chest pain and  palpitations.  Gastrointestinal: Negative for diarrhea.  Endocrine: Negative for heat intolerance.  Genitourinary: Negative.   Musculoskeletal: Negative for myalgias.  Neurological: Negative for headaches.  Psychiatric/Behavioral: Negative.        Objective:   Physical Exam  Constitutional: She is oriented to person, place, and time. She appears well-developed and well-nourished.  HENT:  Nose: Nose normal.  Mouth/Throat: Oropharynx is clear and moist.  Eyes: EOM are normal.  Neck: Trachea normal, normal range of motion and full passive range of motion without pain. Neck supple. No JVD present. Carotid bruit is not present. No thyromegaly present.  Cardiovascular: Normal rate, regular rhythm, normal heart sounds and intact distal pulses.  Exam reveals no gallop and no friction rub.   No murmur heard. Pulmonary/Chest: Effort normal and breath sounds normal.  Abdominal: Soft. Bowel sounds are normal. She exhibits no distension and no mass. There is no tenderness.  Musculoskeletal: Normal range of motion. She exhibits edema (1+ pitting edema bil).  Lymphadenopathy:    She has no cervical adenopathy.  Neurological: She is alert and oriented to person, place, and time. She has normal reflexes.  Skin: Skin is warm and dry.  Psychiatric: She has a normal mood and affect. Her behavior is normal. Judgment and thought content normal.    BP 138/84 mmHg  Pulse 84  Temp(Src) 97.5 F (36.4 C) (Oral)  Ht '5\' 2"'  (1.575 m)  Wt 188 lb (85.276 kg)  BMI 34.38 kg/m2       Assessment & Plan:  1. Essential hypertension Do not add salt to diet - CMP14+EGFR  2. Hypothyroidism, unspecified hypothyroidism type  3. Hyperlipidemia Low fat diet - fenofibrate micronized (LOFIBRA) 134 MG capsule; Take 1 capsule (134 mg total) by mouth daily before breakfast.  Dispense: 90 capsule; Refill: 1 - NMR, lipoprofile  4. Gastroesophageal reflux disease, esophagitis presence  not specified Avoid spicy  foods Do not eat 2 hours prior to bedtime  5. Screening for osteoporosis Weight bearing eercises - HM DEXA SCAN   hemoccult cards given to patient with directions Labs pending Health maintenance reviewed Diet and exercise encouraged Continue all meds Follow up  In 3 months   [place], FNP

## 2014-06-28 LAB — CMP14+EGFR
A/G RATIO: 2.1 (ref 1.1–2.5)
ALT: 21 IU/L (ref 0–32)
AST: 16 IU/L (ref 0–40)
Albumin: 4.9 g/dL — ABNORMAL HIGH (ref 3.6–4.8)
Alkaline Phosphatase: 60 IU/L (ref 39–117)
BUN/Creatinine Ratio: 14 (ref 11–26)
BUN: 11 mg/dL (ref 8–27)
Bilirubin Total: <0.2 mg/dL (ref 0.0–1.2)
CALCIUM: 10.1 mg/dL (ref 8.7–10.3)
CO2: 23 mmol/L (ref 18–29)
CREATININE: 0.8 mg/dL (ref 0.57–1.00)
Chloride: 100 mmol/L (ref 97–108)
GFR calc Af Amer: 91 mL/min/{1.73_m2} (ref >59)
GFR calc non Af Amer: 79 mL/min/{1.73_m2} (ref >59)
Globulin, Total: 2.3 g/dL (ref 1.5–4.5)
Glucose: 106 mg/dL — ABNORMAL HIGH (ref 65–99)
POTASSIUM: 4.6 mmol/L (ref 3.5–5.2)
SODIUM: 140 mmol/L (ref 134–144)
TOTAL PROTEIN: 7.2 g/dL (ref 6.0–8.5)

## 2014-06-28 LAB — NMR, LIPOPROFILE
CHOLESTEROL: 150 mg/dL (ref 100–199)
HDL Cholesterol by NMR: 49 mg/dL (ref >39)
HDL PARTICLE NUMBER: 39.7 umol/L (ref >=30.5)
LDL Particle Number: 999 nmol/L (ref <1000)
LDL Size: 19.9 nm (ref >20.5)
LDL-C: 60 mg/dL (ref 0–99)
LP-IR SCORE: 67 — AB (ref <=45)
SMALL LDL PARTICLE NUMBER: 778 nmol/L — AB (ref <=527)
Triglycerides by NMR: 207 mg/dL — ABNORMAL HIGH (ref 0–149)

## 2014-07-02 ENCOUNTER — Other Ambulatory Visit (INDEPENDENT_AMBULATORY_CARE_PROVIDER_SITE_OTHER): Payer: Medicare Other

## 2014-07-02 DIAGNOSIS — Z1212 Encounter for screening for malignant neoplasm of rectum: Secondary | ICD-10-CM | POA: Diagnosis not present

## 2014-07-02 NOTE — Progress Notes (Signed)
Lab only 

## 2014-07-04 ENCOUNTER — Encounter: Payer: Self-pay | Admitting: *Deleted

## 2014-07-04 LAB — FECAL OCCULT BLOOD, IMMUNOCHEMICAL: Fecal Occult Bld: NEGATIVE

## 2014-07-11 ENCOUNTER — Ambulatory Visit (INDEPENDENT_AMBULATORY_CARE_PROVIDER_SITE_OTHER): Payer: Medicare Other

## 2014-07-11 ENCOUNTER — Encounter: Payer: Self-pay | Admitting: Pharmacist

## 2014-07-11 ENCOUNTER — Ambulatory Visit (INDEPENDENT_AMBULATORY_CARE_PROVIDER_SITE_OTHER): Payer: Medicare Other | Admitting: Pharmacist

## 2014-07-11 VITALS — Ht 62.0 in | Wt 187.0 lb

## 2014-07-11 DIAGNOSIS — M8580 Other specified disorders of bone density and structure, unspecified site: Secondary | ICD-10-CM | POA: Insufficient documentation

## 2014-07-11 DIAGNOSIS — Z780 Asymptomatic menopausal state: Secondary | ICD-10-CM

## 2014-07-11 DIAGNOSIS — Z13820 Encounter for screening for osteoporosis: Secondary | ICD-10-CM

## 2014-07-11 DIAGNOSIS — Z79899 Other long term (current) drug therapy: Secondary | ICD-10-CM
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
## 2014-07-11 NOTE — Patient Instructions (Signed)

## 2014-07-20 ENCOUNTER — Other Ambulatory Visit: Payer: Self-pay | Admitting: Nurse Practitioner

## 2014-07-25 ENCOUNTER — Other Ambulatory Visit: Payer: Self-pay | Admitting: Nurse Practitioner

## 2014-08-01 ENCOUNTER — Ambulatory Visit (INDEPENDENT_AMBULATORY_CARE_PROVIDER_SITE_OTHER): Payer: Medicare Other

## 2014-08-01 ENCOUNTER — Encounter: Payer: Self-pay | Admitting: Nurse Practitioner

## 2014-08-01 ENCOUNTER — Ambulatory Visit (INDEPENDENT_AMBULATORY_CARE_PROVIDER_SITE_OTHER): Payer: Medicare Other | Admitting: Nurse Practitioner

## 2014-08-01 ENCOUNTER — Telehealth: Payer: Self-pay | Admitting: Nurse Practitioner

## 2014-08-01 VITALS — BP 158/75 | HR 88 | Temp 97.3°F | Ht 62.0 in | Wt 189.0 lb

## 2014-08-01 DIAGNOSIS — M25562 Pain in left knee: Secondary | ICD-10-CM

## 2014-08-01 DIAGNOSIS — M79662 Pain in left lower leg: Secondary | ICD-10-CM

## 2014-08-01 NOTE — Telephone Encounter (Signed)
Pt given appt today with MMM at 4:30.

## 2014-08-01 NOTE — Patient Instructions (Signed)

## 2014-08-01 NOTE — Progress Notes (Signed)
   Subjective:    Patient ID: Karla Maxwell, female    DOB: October 22, 1950, 64 y.o.   MRN: [id number]  HPI Patient in c/o left leg pain that started 2 weeks ago- the pain is gradually getting worse with swelling- Heat helps pain but does not help swelling- ice helps swelling but does not help pain.     Review of Systems  Constitutional: Negative.   HENT: Negative.   Respiratory: Negative.   Cardiovascular: Negative.   Gastrointestinal: Negative.   Genitourinary: Negative.   Neurological: Negative.   Psychiatric/Behavioral: Negative.   All other systems reviewed and are negative.      Objective:   Physical Exam  Constitutional: She appears well-developed and well-nourished.  Cardiovascular: Normal rate, regular rhythm and normal heart sounds.   Pulmonary/Chest: Effort normal and breath sounds normal.  Musculoskeletal:  Left calf swelling with mildly positive homan sign  Skin: Skin is warm and dry.  Psychiatric: She has a normal mood and affect. Her behavior is normal. Judgment and thought content normal.   BP 158/75 mmHg  Pulse 88  Temp(Src) 97.3 F (36.3 C) (Oral)  Ht 5\' 2"  (1.575 m)  Wt 189 lb (85.73 kg)  BMI 34.56 kg/m2   Left knee x ray- normal-Preliminary reading by Ronnald Collum, FNP  [facility]       Assessment & Plan:  1. Left knee pain  - DG Knee 1-2 Views Left; Future  2. Calf pain, left Rest  Elevate leg Will schedule Doppler study for in morning - Lower Extremity Venous Duplex Left; Future   Mary-Margaret Hassell Done, FNP

## 2014-08-02 ENCOUNTER — Ambulatory Visit (HOSPITAL_COMMUNITY)
Admission: RE | Admit: 2014-08-02 | Discharge: 2014-08-02 | Disposition: A | Discharge status: Home / Self Care | Payer: Medicare Other | Source: Ambulatory Visit | Attending: Nurse Practitioner | Admitting: Nurse Practitioner

## 2014-08-02 ENCOUNTER — Other Ambulatory Visit: Payer: Self-pay

## 2014-08-02 DIAGNOSIS — M25562 Pain in left knee: Secondary | ICD-10-CM | POA: Diagnosis not present

## 2014-08-02 DIAGNOSIS — M79605 Pain in left leg: Secondary | ICD-10-CM | POA: Diagnosis not present

## 2014-09-26 ENCOUNTER — Ambulatory Visit (INDEPENDENT_AMBULATORY_CARE_PROVIDER_SITE_OTHER): Payer: Medicare Other | Admitting: Nurse Practitioner

## 2014-09-26 ENCOUNTER — Ambulatory Visit (INDEPENDENT_AMBULATORY_CARE_PROVIDER_SITE_OTHER): Payer: Medicare Other

## 2014-09-26 ENCOUNTER — Encounter: Payer: Self-pay | Admitting: Nurse Practitioner

## 2014-09-26 VITALS — BP 139/74 | HR 84 | Temp 97.0°F | Ht 62.0 in | Wt 187.4 lb

## 2014-09-26 DIAGNOSIS — E785 Hyperlipidemia, unspecified: Secondary | ICD-10-CM

## 2014-09-26 DIAGNOSIS — J4540 Moderate persistent asthma, uncomplicated: Secondary | ICD-10-CM | POA: Diagnosis not present

## 2014-09-26 DIAGNOSIS — M8580 Other specified disorders of bone density and structure, unspecified site: Secondary | ICD-10-CM

## 2014-09-26 DIAGNOSIS — Z720 Tobacco use: Secondary | ICD-10-CM | POA: Diagnosis not present

## 2014-09-26 DIAGNOSIS — K219 Gastro-esophageal reflux disease without esophagitis: Secondary | ICD-10-CM | POA: Diagnosis not present

## 2014-09-26 DIAGNOSIS — Z6834 Body mass index (BMI) 34.0-34.9, adult: Secondary | ICD-10-CM | POA: Diagnosis not present

## 2014-09-26 DIAGNOSIS — E039 Hypothyroidism, unspecified: Secondary | ICD-10-CM | POA: Diagnosis not present

## 2014-09-26 DIAGNOSIS — I10 Essential (primary) hypertension: Secondary | ICD-10-CM

## 2014-09-26 DIAGNOSIS — F17200 Nicotine dependence, unspecified, uncomplicated: Secondary | ICD-10-CM

## 2014-09-26 DIAGNOSIS — Z6833 Body mass index (BMI) 33.0-33.9, adult: Secondary | ICD-10-CM

## 2014-09-26 MED ORDER — LEVOTHYROXINE SODIUM 75 MCG PO TABS: ORAL_TABLET | ORAL | Status: DC

## 2014-09-26 NOTE — Patient Instructions (Signed)
Bone Health Our bones do many things. They provide structure, protect organs, anchor muscles, and store calcium. Adequate calcium in your diet and weight-bearing physical activity help build strong bones, improve bone amounts, and may reduce the risk of weakening of bones (osteoporosis) later in life. PEAK BONE MASS By age 64, the average woman has acquired most of her skeletal bone mass. A large decline occurs in older adults which increases the risk of osteoporosis. In women this occurs around the time of menopause. It is important for young girls to reach their peak bone mass in order to maintain bone health throughout life. A person with high bone mass as a young adult will be more likely to have a higher bone mass later in life. Not enough calcium consumption and physical activity early on could result in a failure to achieve optimum bone mass in adulthood. OSTEOPOROSIS Osteoporosis is a disease of the bones. It is defined as low bone mass with deterioration of bone structure. Osteoporosis leads to an increase risk of fractures with falls. These fractures commonly happen in the wrist, hip, and spine. While men and women of all ages and background can develop osteoporosis, some of the risk factors for osteoporosis are:  Female.  White.  Postmenopausal.  Older adults.  Small in body size.  Eating a diet low in calcium.  Physically inactive.  Smoking.  Use of some medications.  Family history. CALCIUM Calcium is a mineral needed by the body for healthy bones, teeth, and proper function of the heart, muscles, and nerves. The body cannot produce calcium so it must be absorbed through food. Good sources of calcium include:  Dairy products (low fat or nonfat milk, cheese, and yogurt).  Dark green leafy vegetables (bok choy and broccoli).  Calcium fortified foods (orange juice, cereal, bread, soy beverages, and tofu products).  Nuts (almonds). Recommended amounts of calcium vary  for individuals. RECOMMENDED CALCIUM INTAKES Age and Amount in mg per day  Children 1 to 3 years / 700 mg  Children 4 to 8 years / 1,000 mg  Children 9 to 13 years / 1,300 mg  Teens 14 to 18 years / 1,300 mg  Adults 19 to 50 years / 1,000 mg  Adult women 51 to 70 years / 1,200 mg  Adults 71 years and older / 1,200 mg  Pregnant and breastfeeding teens / 1,300 mg  Pregnant and breastfeeding adults / 1,000 mg Vitamin D also plays an important role in healthy bone development. Vitamin D helps in the absorption of calcium. WEIGHT-BEARING PHYSICAL ACTIVITY Regular physical activity has many positive health benefits. Benefits include strong bones. Weight-bearing physical activity early in life is important in reaching peak bone mass. Weight-bearing physical activities cause muscles and bones to work against gravity. Some examples of weight bearing physical activities include:  Walking, jogging, or running.  Field Hockey.  Jumping rope.  Dancing.  Soccer.  Tennis or Racquetball.  Stair climbing.  Basketball.  Hiking.  Weight lifting.  Aerobic fitness classes. Including weight-bearing physical activity into an exercise plan is a great way to keep bones healthy. Adults: Engage in at least 30 minutes of moderate physical activity on most, preferably all, days of the week. Children: Engage in at least 60 minutes of moderate physical activity on most, preferably all, days of the week. FOR MORE INFORMATION [place] Department of Agriculture, Center for Nutrition Policy and Promotion: www.cnpp.usda.gov National Osteoporosis Foundation: www.nof.org Document Released: 07/11/2003 Document Revised: 08/15/2012 Document Reviewed: 10/10/2008 ExitCare Patient Information   2015 [employer]. This information is not intended to replace advice given to you by your health care provider. Make sure you discuss any questions you have with your health care provider.  

## 2014-09-26 NOTE — Progress Notes (Signed)
Subjective:    Patient ID: Karla Maxwell, female    DOB: 08-15-1950, 64 y.o.   MRN: [id number]  Patient here today for follow up of chronic medical problem- no changes since last visit- no complaints.  Hypertension This is a chronic problem. The current episode started more than 1 year ago. The problem is unchanged. The problem is controlled. Pertinent negatives include no chest pain, headaches, palpitations or shortness of breath. Risk factors for coronary artery disease include dyslipidemia, female gender, post-menopausal state, obesity and sedentary lifestyle. Past treatments include calcium channel blockers. The current treatment provides moderate improvement. Compliance problems include diet and exercise.  Hypertensive end-organ damage includes CAD/MI and a thyroid problem.  Hyperlipidemia This is a chronic problem. The current episode started more than 1 year ago. The problem is uncontrolled. Recent lipid tests were reviewed and are variable. Pertinent negatives include no chest pain, myalgias or shortness of breath. Current antihyperlipidemic treatment includes statins and fibric acid derivatives. The current treatment provides no improvement of lipids. Compliance problems include adherence to diet and adherence to exercise.  Risk factors for coronary artery disease include dyslipidemia, hypertension and post-menopausal.  Gastrophageal Reflux She reports no chest pain. Pertinent negatives include no fatigue.  Thyroid Problem Presents for follow-up (hypothyroidism) visit. Patient reports no diarrhea, fatigue, heat intolerance, palpitations or visual change. Her past medical history is significant for hyperlipidemia.  bronchial asthma Symbicort and singulair daily- Occassional smoker- 3-5 a day. Has not needed albuterol in a few months  Review of Systems  Constitutional: Negative for fatigue.  Respiratory: Negative for shortness of breath.   Cardiovascular: Negative for chest pain and  palpitations.  Gastrointestinal: Negative for diarrhea.  Endocrine: Negative for heat intolerance.  Genitourinary: Negative.   Musculoskeletal: Negative for myalgias.  Neurological: Negative for headaches.  Psychiatric/Behavioral: Negative.        Objective:   Physical Exam  Constitutional: She is oriented to person, place, and time. She appears well-developed and well-nourished.  HENT:  Nose: Nose normal.  Mouth/Throat: Oropharynx is clear and moist.  Eyes: EOM are normal.  Neck: Trachea normal, normal range of motion and full passive range of motion without pain. Neck supple. No JVD present. Carotid bruit is not present. No thyromegaly present.  Cardiovascular: Normal rate, regular rhythm, normal heart sounds and intact distal pulses.  Exam reveals no gallop and no friction rub.   No murmur heard. Pulmonary/Chest: Effort normal and breath sounds normal.  Abdominal: Soft. Bowel sounds are normal. She exhibits no distension and no mass. There is no tenderness.  Musculoskeletal: Normal range of motion. She exhibits edema (1+ pitting edema bil).  Lymphadenopathy:    She has no cervical adenopathy.  Neurological: She is alert and oriented to person, place, and time. She has normal reflexes.  Skin: Skin is warm and dry.  Psychiatric: She has a normal mood and affect. Her behavior is normal. Judgment and thought content normal.    BP 139/74 mmHg  Pulse 84  Temp(Src) 97 F (36.1 C) (Oral)  Ht $R'5\' 2"'oX$  (1.575 m)  Wt 187 lb 6.4 oz (85.004 kg)  BMI 34.27 kg/m2  Chest x ray- mild bronchitic changes-Preliminary reading by Ronnald Collum, FNP  [facility]   Adella Nissen, FNP      Assessment & Plan:  1. Essential hypertension Do not add salt to diet - EKG 12-Lead - CMP14+EGFR  2. Bronchial asthma, moderate persistent, uncomplicated Avoid allergens  3. Gastroesophageal reflux disease, esophagitis presence not specified Avoid spicy foods Do not  eat 2 hours prior to  bedtime  4. Hypothyroidism, unspecified hypothyroidism type - Thyroid Panel With TSH - levothyroxine (SYNTHROID, LEVOTHROID) 75 MCG tablet; TAKE 1 TABLET (75 MCG TOTAL)  BY MOUTH DAILY.  Dispense: 90 tablet; Refill: 1  5. Osteopenia Weight bearing exercises  6. Hyperlipidemia Low fat diet - NMR, lipoprofile  7. BMI 34.0-34.9,adult Discussed diet and exercise for person with BMI >25 Will recheck weight in 3-6 months   8. Smoker *smoking cessation encouraged - DG Chest 2 View; Future    Labs pending Health maintenance reviewed Diet and exercise encouraged Continue all meds Follow up  In 3 month   [place], FNP

## 2014-09-27 LAB — CMP14+EGFR
ALT: 20 IU/L (ref 0–32)
AST: 21 IU/L (ref 0–40)
Albumin/Globulin Ratio: 2 (ref 1.1–2.5)
Albumin: 4.7 g/dL (ref 3.6–4.8)
Alkaline Phosphatase: 64 IU/L (ref 39–117)
BUN/Creatinine Ratio: 15 (ref 11–26)
BUN: 12 mg/dL (ref 8–27)
Bilirubin Total: 0.3 mg/dL (ref 0.0–1.2)
CALCIUM: 10.1 mg/dL (ref 8.7–10.3)
CHLORIDE: 100 mmol/L (ref 97–108)
CO2: 19 mmol/L (ref 18–29)
CREATININE: 0.8 mg/dL (ref 0.57–1.00)
GFR calc Af Amer: 91 mL/min/{1.73_m2} (ref >59)
GFR calc non Af Amer: 79 mL/min/{1.73_m2} (ref >59)
Globulin, Total: 2.4 g/dL (ref 1.5–4.5)
Glucose: 101 mg/dL — ABNORMAL HIGH (ref 65–99)
Potassium: 4.6 mmol/L (ref 3.5–5.2)
Sodium: 141 mmol/L (ref 134–144)
TOTAL PROTEIN: 7.1 g/dL (ref 6.0–8.5)

## 2014-09-27 LAB — THYROID PANEL WITH TSH
FREE THYROXINE INDEX: 2.3 (ref 1.2–4.9)
T3 UPTAKE RATIO: 22 % — AB (ref 24–39)
T4, Total: 10.6 ug/dL (ref 4.5–12.0)
TSH: 1.49 u[IU]/mL (ref 0.450–4.500)

## 2014-09-27 LAB — NMR, LIPOPROFILE
Cholesterol: 146 mg/dL (ref 100–199)
HDL Cholesterol by NMR: 45 mg/dL (ref >39)
HDL Particle Number: 37.6 umol/L (ref >=30.5)
LDL Particle Number: 1278 nmol/L — ABNORMAL HIGH (ref <1000)
LDL Size: 20.3 nm (ref >20.5)
LDL-C: 67 mg/dL (ref 0–99)
LP-IR Score: 75 — ABNORMAL HIGH (ref <=45)
SMALL LDL PARTICLE NUMBER: 708 nmol/L — AB (ref <=527)
Triglycerides by NMR: 171 mg/dL — ABNORMAL HIGH (ref 0–149)

## 2014-11-23 ENCOUNTER — Other Ambulatory Visit: Payer: Self-pay | Admitting: Nurse Practitioner

## 2014-12-19 ENCOUNTER — Ambulatory Visit (INDEPENDENT_AMBULATORY_CARE_PROVIDER_SITE_OTHER): Payer: Medicare Other | Admitting: Pharmacist

## 2014-12-19 ENCOUNTER — Encounter: Payer: Self-pay | Admitting: Pharmacist

## 2014-12-19 VITALS — BP 150/76 | HR 77 | Ht 62.0 in | Wt 185.0 lb

## 2014-12-19 DIAGNOSIS — Z79899 Other long term (current) drug therapy: Secondary | ICD-10-CM

## 2014-12-19 DIAGNOSIS — Z0000 Encounter for general adult medical examination without abnormal findings: Secondary | ICD-10-CM

## 2014-12-19 MED ORDER — ATORVASTATIN CALCIUM 80 MG PO TABS: 80.0000 mg | ORAL_TABLET | Freq: Every day | ORAL | Status: DC
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
## 2014-12-19 NOTE — Patient Instructions (Addendum)
Karla Maxwell , Thank you for taking time to come for your Medicare Wellness Visit. I appreciate your ongoing commitment to your health goals. Please review the following plan we discussed and let me know if I can assist you in the future.    This is a list of the screening recommended for you and due dates:  Health Maintenance  Topic Date Due  .  Hepatitis C: One time screening is recommended by Center for Disease Control  (CDC) for  adults born from [birth year] through 1965.   Nov 15, 1950  . Flu Shot  12/26/2014*  . HIV Screening  06/28/2015*  . Stool Blood Test  07/04/2015  . Mammogram  02/01/2015 or after  . Colon Cancer Screening  07/02/2016  . DEXA scan (bone density measurement)  07/10/2016  . Tetanus Vaccine  04/04/2019  *Topic was postponed. The date shown is not the original due date.     DASH Eating Plan DASH stands for "Dietary Approaches to Stop Hypertension." The DASH eating plan is a healthy eating plan that has been shown to reduce high blood pressure (hypertension). Additional health benefits may include reducing the risk of type 2 diabetes mellitus, heart disease, and stroke. The DASH eating plan may also help with weight loss. WHAT DO I NEED TO KNOW ABOUT THE DASH EATING PLAN? For the DASH eating plan, you will follow these general guidelines:  Choose foods with a percent daily value for sodium of less than 5% (as listed on the food label).  Use salt-free seasonings or herbs instead of table salt or sea salt.  Check with your health care provider or pharmacist before using salt substitutes.  Eat lower-sodium products, often labeled as "lower sodium" or "no salt added."  Eat fresh foods.  Eat more vegetables, fruits, and low-fat dairy products.  Choose whole grains. Look for the word "whole" as the first word in the ingredient list.  Choose fish and skinless chicken or [place] more often than red meat. Limit fish, poultry, and meat to 6 oz (170 g) each day.  Limit  sweets, desserts, sugars, and sugary drinks.  Choose heart-healthy fats.  Limit cheese to 1 oz (28 g) per day.  Eat more home-cooked food and less restaurant, buffet, and fast food.  Limit fried foods.  Cook foods using methods other than frying.  Limit canned vegetables. If you do use them, rinse them well to decrease the sodium.  When eating at a restaurant, ask that your food be prepared with less salt, or no salt if possible. WHAT FOODS CAN I EAT? Seek help from a dietitian for individual calorie needs. Grains Whole grain or whole wheat bread. Brown rice. Whole grain or whole wheat pasta. Quinoa, bulgur, and whole grain cereals. Low-sodium cereals. Corn or whole wheat flour tortillas. Whole grain cornbread. Whole grain crackers. Low-sodium crackers. Vegetables Fresh or frozen vegetables (raw, steamed, roasted, or grilled). Low-sodium or reduced-sodium tomato and vegetable juices. Low-sodium or reduced-sodium tomato sauce and paste. Low-sodium or reduced-sodium canned vegetables.  Fruits All fresh, canned (in natural juice), or frozen fruits. Meat and Other Protein Products Ground beef (85% or leaner), grass-fed beef, or beef trimmed of fat. Skinless chicken or [place]. Ground chicken or [place]. Pork trimmed of fat. All fish and seafood. Eggs. Dried beans, peas, or lentils. Unsalted nuts and seeds. Unsalted canned beans. Dairy Low-fat dairy products, such as skim or 1% milk, 2% or reduced-fat cheeses, low-fat ricotta or cottage cheese, or plain low-fat yogurt. Low-sodium or reduced-sodium cheeses. Fats  and Oils Tub margarines without trans fats. Light or reduced-fat mayonnaise and salad dressings (reduced sodium). Avocado. Safflower, olive, or canola oils. Natural peanut or almond butter. Other Unsalted popcorn and pretzels. The items listed above may not be a complete list of recommended foods or beverages. Contact your dietitian for more options. WHAT FOODS ARE NOT  RECOMMENDED? Grains White bread. White pasta. White rice. Refined cornbread. Bagels and croissants. Crackers that contain trans fat. Vegetables Creamed or fried vegetables. Vegetables in a cheese sauce. Regular canned vegetables. Regular canned tomato sauce and paste. Regular tomato and vegetable juices. Fruits Dried fruits. Canned fruit in light or heavy syrup. Fruit juice. Meat and Other Protein Products Fatty cuts of meat. Ribs, chicken wings, bacon, sausage, bologna, salami, chitterlings, fatback, hot dogs, bratwurst, and packaged luncheon meats. Salted nuts and seeds. Canned beans with salt. Dairy Whole or 2% milk, cream, half-and-half, and cream cheese. Whole-fat or sweetened yogurt. Full-fat cheeses or blue cheese. Nondairy creamers and whipped toppings. Processed cheese, cheese spreads, or cheese curds. Condiments Onion and garlic salt, seasoned salt, table salt, and sea salt. Canned and packaged gravies. Worcestershire sauce. Tartar sauce. Barbecue sauce. Teriyaki sauce. Soy sauce, including reduced sodium. Steak sauce. Fish sauce. Oyster sauce. Cocktail sauce. Horseradish. Ketchup and mustard. Meat flavorings and tenderizers. Bouillon cubes. Hot sauce. Tabasco sauce. Marinades. Taco seasonings. Relishes. Fats and Oils Butter, stick margarine, lard, shortening, ghee, and bacon fat. Coconut, palm kernel, or palm oils. Regular salad dressings. Other Pickles and olives. Salted popcorn and pretzels. The items listed above may not be a complete list of foods and beverages to avoid. Contact your dietitian for more information. WHERE CAN I FIND MORE INFORMATION? National Heart, Lung, and Blood Institute: travelstabloid.com Document Released: 04/09/2011 Document Revised: 09/04/2013 Document Reviewed: 02/22/2013 [facility] Patient Information 2015 [place] Maxwell, [place]. This information is not intended to replace advice given to you by your health care provider. Make  sure you discuss any questions you have with your health care provider.

## 2014-12-26 ENCOUNTER — Other Ambulatory Visit: Payer: Self-pay | Admitting: Nurse Practitioner

## 2015-01-18 ENCOUNTER — Other Ambulatory Visit: Payer: Self-pay | Admitting: Nurse Practitioner

## 2015-01-22 ENCOUNTER — Ambulatory Visit (INDEPENDENT_AMBULATORY_CARE_PROVIDER_SITE_OTHER): Payer: Medicare Other | Admitting: Nurse Practitioner

## 2015-01-22 ENCOUNTER — Encounter: Payer: Self-pay | Admitting: Nurse Practitioner

## 2015-01-22 VITALS — BP 142/82 | HR 78 | Temp 97.2°F | Ht 62.0 in | Wt 184.2 lb

## 2015-01-22 DIAGNOSIS — E785 Hyperlipidemia, unspecified: Secondary | ICD-10-CM | POA: Diagnosis not present

## 2015-01-22 DIAGNOSIS — K219 Gastro-esophageal reflux disease without esophagitis: Secondary | ICD-10-CM

## 2015-01-22 DIAGNOSIS — I10 Essential (primary) hypertension: Secondary | ICD-10-CM | POA: Diagnosis not present

## 2015-01-22 DIAGNOSIS — J4540 Moderate persistent asthma, uncomplicated: Secondary | ICD-10-CM

## 2015-01-22 DIAGNOSIS — Z6833 Body mass index (BMI) 33.0-33.9, adult: Secondary | ICD-10-CM | POA: Diagnosis not present

## 2015-01-22 DIAGNOSIS — E039 Hypothyroidism, unspecified: Secondary | ICD-10-CM

## 2015-01-22 MED ORDER — OMEPRAZOLE 40 MG PO CPDR: 40.0000 mg | DELAYED_RELEASE_CAPSULE | Freq: Every day | ORAL | Status: DC

## 2015-01-22 MED ORDER — FENOFIBRATE MICRONIZED 134 MG PO CAPS: ORAL_CAPSULE | ORAL | Status: DC

## 2015-01-22 MED ORDER — ATORVASTATIN CALCIUM 80 MG PO TABS
80.0000 mg | ORAL_TABLET | Freq: Every day | ORAL | Status: DC
Start: 2015-01-22 — End: 2015-02-20

## 2015-01-22 MED ORDER — AMLODIPINE BESYLATE 5 MG PO TABS: 5.0000 mg | ORAL_TABLET | Freq: Every day | ORAL | Status: DC

## 2015-01-22 MED ORDER — BUDESONIDE-FORMOTEROL FUMARATE 160-4.5 MCG/ACT IN AERO: INHALATION_SPRAY | RESPIRATORY_TRACT | Status: DC

## 2015-01-22 NOTE — Patient Instructions (Signed)
Fat and Cholesterol Control Diet Fat and cholesterol levels in your blood and organs are influenced by your diet. High levels of fat and cholesterol may lead to diseases of the heart, small and large blood vessels, gallbladder, liver, and pancreas. CONTROLLING FAT AND CHOLESTEROL WITH DIET Although exercise and lifestyle factors are important, your diet is key. That is because certain foods are known to raise cholesterol and others to lower it. The goal is to balance foods for their effect on cholesterol and more importantly, to replace saturated and trans fat with other types of fat, such as monounsaturated fat, polyunsaturated fat, and omega-3 fatty acids. On average, a person should consume no more than 15 to 17 g of saturated fat daily. Saturated and trans fats are considered "bad" fats, and they will raise LDL cholesterol. Saturated fats are primarily found in animal products such as meats, butter, and cream. However, that does not mean you need to give up all your favorite foods. Today, there are good tasting, low-fat, low-cholesterol substitutes for most of the things you like to eat. Choose low-fat or nonfat alternatives. Choose round or loin cuts of red meat. These types of cuts are lowest in fat and cholesterol. Chicken (without the skin), fish, veal, and ground turkey breast are great choices. Eliminate fatty meats, such as hot dogs and salami. Even shellfish have little or no saturated fat. Have a 3 oz (85 g) portion when you eat lean meat, poultry, or fish. Trans fats are also called "partially hydrogenated oils." They are oils that have been scientifically manipulated so that they are solid at room temperature resulting in a longer shelf life and improved taste and texture of foods in which they are added. Trans fats are found in stick margarine, some tub margarines, cookies, crackers, and baked goods.  When baking and cooking, oils are a great substitute for butter. The monounsaturated oils are  especially beneficial since it is believed they lower LDL and raise HDL. The oils you should avoid entirely are saturated tropical oils, such as coconut and palm.  Remember to eat a lot from food groups that are naturally free of saturated and trans fat, including fish, fruit, vegetables, beans, grains (barley, rice, couscous, bulgur wheat), and pasta (without cream sauces).  IDENTIFYING FOODS THAT LOWER FAT AND CHOLESTEROL  Soluble fiber may lower your cholesterol. This type of fiber is found in fruits such as apples, vegetables such as broccoli, potatoes, and carrots, legumes such as beans, peas, and lentils, and grains such as barley. Foods fortified with plant sterols (phytosterol) may also lower cholesterol. You should eat at least 2 g per day of these foods for a cholesterol lowering effect.  Read package labels to identify low-saturated fats, trans fat free, and low-fat foods at the supermarket. Select cheeses that have only 2 to 3 g saturated fat per ounce. Use a heart-healthy tub margarine that is free of trans fats or partially hydrogenated oil. When buying baked goods (cookies, crackers), avoid partially hydrogenated oils. Breads and muffins should be made from whole grains (whole-wheat or whole oat flour, instead of "flour" or "enriched flour"). Buy non-creamy canned soups with reduced salt and no added fats.  FOOD PREPARATION TECHNIQUES  Never deep-fry. If you must fry, either stir-fry, which uses very little fat, or use non-stick cooking sprays. When possible, broil, bake, or roast meats, and steam vegetables. Instead of putting butter or margarine on vegetables, use lemon and herbs, applesauce, and cinnamon (for squash and sweet potatoes). Use nonfat   yogurt, salsa, and low-fat dressings for salads.  LOW-SATURATED FAT / LOW-FAT FOOD SUBSTITUTES Meats / Saturated Fat (g)  Avoid: Steak, marbled (3 oz/85 g) / 11 g  Choose: Steak, lean (3 oz/85 g) / 4 g  Avoid: Hamburger (3 oz/85 g) / 7  g  Choose: Hamburger, lean (3 oz/85 g) / 5 g  Avoid: Ham (3 oz/85 g) / 6 g  Choose: Ham, lean cut (3 oz/85 g) / 2.4 g  Avoid: Chicken, with skin, dark meat (3 oz/85 g) / 4 g  Choose: Chicken, skin removed, dark meat (3 oz/85 g) / 2 g  Avoid: Chicken, with skin, light meat (3 oz/85 g) / 2.5 g  Choose: Chicken, skin removed, light meat (3 oz/85 g) / 1 g Dairy / Saturated Fat (g)  Avoid: Whole milk (1 cup) / 5 g  Choose: Low-fat milk, 2% (1 cup) / 3 g  Choose: Low-fat milk, 1% (1 cup) / 1.5 g  Choose: Skim milk (1 cup) / 0.3 g  Avoid: Hard cheese (1 oz/28 g) / 6 g  Choose: Skim milk cheese (1 oz/28 g) / 2 to 3 g  Avoid: Cottage cheese, 4% fat (1 cup) / 6.5 g  Choose: Low-fat cottage cheese, 1% fat (1 cup) / 1.5 g  Avoid: Ice cream (1 cup) / 9 g  Choose: Sherbet (1 cup) / 2.5 g  Choose: Nonfat frozen yogurt (1 cup) / 0.3 g  Choose: Frozen fruit bar / trace  Avoid: Whipped cream (1 tbs) / 3.5 g  Choose: Nondairy whipped topping (1 tbs) / 1 g Condiments / Saturated Fat (g)  Avoid: Mayonnaise (1 tbs) / 2 g  Choose: Low-fat mayonnaise (1 tbs) / 1 g  Avoid: Butter (1 tbs) / 7 g  Choose: Extra light margarine (1 tbs) / 1 g  Avoid: Coconut oil (1 tbs) / 11.8 g  Choose: Olive oil (1 tbs) / 1.8 g  Choose: Corn oil (1 tbs) / 1.7 g  Choose: Safflower oil (1 tbs) / 1.2 g  Choose: Sunflower oil (1 tbs) / 1.4 g  Choose: Soybean oil (1 tbs) / 2.4 g  Choose: Canola oil (1 tbs) / 1 g Document Released: 04/20/2005 Document Revised: 08/15/2012 Document Reviewed: 07/19/2013 [employer] Patient Information 2015 [employer]. This information is not intended to replace advice given to you by your health care provider. Make sure you discuss any questions you have with your health care provider.  

## 2015-01-22 NOTE — Progress Notes (Signed)
Subjective:    Patient ID: Karla Maxwell, female    DOB: [birth year], 64 y.o.   MRN: [id number]  Patient here today for follow up of chronic medical problem- no changes since last visit- no complaints.  Hypertension This is a chronic problem. The current episode started more than 1 year ago. The problem is unchanged. The problem is controlled. Pertinent negatives include no chest pain, headaches, palpitations or shortness of breath. Risk factors for coronary artery disease include dyslipidemia, female gender, post-menopausal state, obesity and sedentary lifestyle. Past treatments include calcium channel blockers. The current treatment provides moderate improvement. Compliance problems include diet and exercise.  Hypertensive end-organ damage includes CAD/MI and a thyroid problem.  Hyperlipidemia This is a chronic problem. The current episode started more than 1 year ago. The problem is uncontrolled. Recent lipid tests were reviewed and are variable. Pertinent negatives include no chest pain, myalgias or shortness of breath. Current antihyperlipidemic treatment includes statins and fibric acid derivatives. The current treatment provides no improvement of lipids. Compliance problems include adherence to diet and adherence to exercise.  Risk factors for coronary artery disease include dyslipidemia, hypertension and post-menopausal.  Gastrophageal Reflux She reports no chest pain. Pertinent negatives include no fatigue.  Thyroid Problem Presents for follow-up (hypothyroidism) visit. Patient reports no diarrhea, fatigue, heat intolerance, palpitations or visual change. Her past medical history is significant for hyperlipidemia.  bronchial asthma Symbicort and singulair daily- Occassional smoker- 3-5 a day. Has not needed albuterol in a few months  Review of Systems  Constitutional: Negative for fatigue.  Respiratory: Negative for shortness of breath.   Cardiovascular: Negative for chest pain and  palpitations.  Gastrointestinal: Negative for diarrhea.  Endocrine: Negative for heat intolerance.  Genitourinary: Negative.   Musculoskeletal: Negative for myalgias.  Neurological: Negative for headaches.  Psychiatric/Behavioral: Negative.        Objective:   Physical Exam  Constitutional: She is oriented to person, place, and time. She appears well-developed and well-nourished.  HENT:  Nose: Nose normal.  Mouth/Throat: Oropharynx is clear and moist.  Eyes: EOM are normal.  Neck: Trachea normal, normal range of motion and full passive range of motion without pain. Neck supple. No JVD present. Carotid bruit is not present. No thyromegaly present.  Cardiovascular: Normal rate, regular rhythm, normal heart sounds and intact distal pulses.  Exam reveals no gallop and no friction rub.   No murmur heard. Pulmonary/Chest: Effort normal and breath sounds normal.  Abdominal: Soft. Bowel sounds are normal. She exhibits no distension and no mass. There is no tenderness.  Musculoskeletal: Normal range of motion. She exhibits edema (1+ pitting edema bil).  Lymphadenopathy:    She has no cervical adenopathy.  Neurological: She is alert and oriented to person, place, and time. She has normal reflexes.  Skin: Skin is warm and dry.  Psychiatric: She has a normal mood and affect. Her behavior is normal. Judgment and thought content normal.   BP 142/82 mmHg  Pulse 78  Temp(Src) 97.2 F (36.2 C) (Oral)  Ht '5\' 2"'  (1.575 m)  Wt 184 lb 3.2 oz (83.553 kg)  BMI 33.68 kg/m2        Assessment & Plan:  1. BMI 33.0-33.9,adult Discussed diet and exercise for person with BMI >25 Will recheck weight in 3-6 months   2. Hyperlipidemia Low fat diet - fenofibrate micronized (LOFIBRA) 134 MG capsule; TAKE ONE CAPSULE BY MOUTH ONE TIME DAILY BEFORE BREAKFAST  Dispense: 90 capsule; Refill: 1 - atorvastatin (LIPITOR) 80 MG tablet; Take  1 tablet (80 mg total) by mouth daily.  Dispense: 30 tablet; Refill:  5 - Lipid panel  3. Hypothyroidism, unspecified hypothyroidism type - Thyroid Panel With TSH  4. Gastroesophageal reflux disease, esophagitis presence not specified Avoid spicy foods Do not eat 2 hours prior to bedtime - omeprazole (PRILOSEC) 40 MG capsule; Take 1 capsule (40 mg total) by mouth daily.  Dispense: 90 capsule; Refill: 1  5. Bronchial asthma, moderate persistent, uncomplicated Avoid cigaretee smoke - budesonide-formoterol (SYMBICORT) 160-4.5 MCG/ACT inhaler; INHALE 2 PUFFS BY MOUTH INTO  THE LUNGS TWICE A DAY AS INSTRUCTED  Dispense: 11 g; Refill: 4  6. Essential hypertension Do not add salt to diet - amLODipine (NORVASC) 5 MG tablet; Take 1 tablet (5 mg total) by mouth daily.  Dispense: 90 tablet; Refill: 1 - CMP14+EGFR    Labs pending Health maintenance reviewed Diet and exercise encouraged Continue all meds Follow up  In 3 months   [place], FNP

## 2015-01-23 LAB — CMP14+EGFR
ALBUMIN: 4.8 g/dL (ref 3.6–4.8)
ALK PHOS: 58 IU/L (ref 39–117)
ALT: 21 IU/L (ref 0–32)
AST: 21 IU/L (ref 0–40)
Albumin/Globulin Ratio: 2 (ref 1.1–2.5)
BUN / CREAT RATIO: 12 (ref 11–26)
BUN: 11 mg/dL (ref 8–27)
Bilirubin Total: 0.4 mg/dL (ref 0.0–1.2)
CHLORIDE: 98 mmol/L (ref 97–108)
CO2: 24 mmol/L (ref 18–29)
CREATININE: 0.93 mg/dL (ref 0.57–1.00)
Calcium: 10.3 mg/dL (ref 8.7–10.3)
GFR calc Af Amer: 75 mL/min/{1.73_m2} (ref >59)
GFR calc non Af Amer: 65 mL/min/{1.73_m2} (ref >59)
GLUCOSE: 106 mg/dL — AB (ref 65–99)
Globulin, Total: 2.4 g/dL (ref 1.5–4.5)
Potassium: 4.8 mmol/L (ref 3.5–5.2)
Sodium: 138 mmol/L (ref 134–144)
Total Protein: 7.2 g/dL (ref 6.0–8.5)

## 2015-01-23 LAB — LIPID PANEL
CHOLESTEROL TOTAL: 144 mg/dL (ref 100–199)
Chol/HDL Ratio: 3.6 ratio units (ref 0.0–4.4)
HDL: 40 mg/dL (ref >39)
LDL CALC: 67 mg/dL (ref 0–99)
TRIGLYCERIDES: 184 mg/dL — AB (ref 0–149)
VLDL CHOLESTEROL CAL: 37 mg/dL (ref 5–40)

## 2015-01-23 LAB — THYROID PANEL WITH TSH
Free Thyroxine Index: 2.5 (ref 1.2–4.9)
T3 Uptake Ratio: 22 % — ABNORMAL LOW (ref 24–39)
T4 TOTAL: 11.3 ug/dL (ref 4.5–12.0)
TSH: 1.85 u[IU]/mL (ref 0.450–4.500)

## 2015-01-24 NOTE — Progress Notes (Signed)
Patient aware.

## 2015-02-18 ENCOUNTER — Ambulatory Visit (INDEPENDENT_AMBULATORY_CARE_PROVIDER_SITE_OTHER): Payer: Medicare Other | Admitting: *Deleted

## 2015-02-18 VITALS — BP 135/82

## 2015-02-18 DIAGNOSIS — I10 Essential (primary) hypertension: Secondary | ICD-10-CM

## 2015-02-18 NOTE — Progress Notes (Signed)
Pt came in here today for a BP recheck. She was crying in the office and 1st reading was 199/83. Pt sat in office and talked for a while and 2nd reading was 155/78. Pt continued to rest and the third reading was 135/82.

## 2015-02-20 ENCOUNTER — Other Ambulatory Visit: Payer: Self-pay | Admitting: *Deleted

## 2015-02-20 DIAGNOSIS — E785 Hyperlipidemia, unspecified: Secondary | ICD-10-CM

## 2015-02-20 MED ORDER — ATORVASTATIN CALCIUM 80 MG PO TABS: 80.0000 mg | ORAL_TABLET | Freq: Every day | ORAL | Status: DC

## 2015-03-04 DIAGNOSIS — Z1231 Encounter for screening mammogram for malignant neoplasm of breast: Secondary | ICD-10-CM | POA: Diagnosis not present

## 2015-05-01 ENCOUNTER — Other Ambulatory Visit: Payer: Self-pay | Admitting: Nurse Practitioner

## 2015-05-14 ENCOUNTER — Encounter: Payer: Self-pay | Admitting: Nurse Practitioner

## 2015-05-14 ENCOUNTER — Ambulatory Visit (INDEPENDENT_AMBULATORY_CARE_PROVIDER_SITE_OTHER): Payer: PPO | Admitting: Nurse Practitioner

## 2015-05-14 VITALS — BP 136/88 | HR 85 | Temp 97.0°F | Ht 62.0 in | Wt 186.0 lb

## 2015-05-14 DIAGNOSIS — K219 Gastro-esophageal reflux disease without esophagitis: Secondary | ICD-10-CM

## 2015-05-14 DIAGNOSIS — Z23 Encounter for immunization: Secondary | ICD-10-CM

## 2015-05-14 DIAGNOSIS — J4540 Moderate persistent asthma, uncomplicated: Secondary | ICD-10-CM | POA: Diagnosis not present

## 2015-05-14 DIAGNOSIS — E785 Hyperlipidemia, unspecified: Secondary | ICD-10-CM | POA: Diagnosis not present

## 2015-05-14 DIAGNOSIS — E039 Hypothyroidism, unspecified: Secondary | ICD-10-CM

## 2015-05-14 DIAGNOSIS — Z6833 Body mass index (BMI) 33.0-33.9, adult: Secondary | ICD-10-CM

## 2015-05-14 DIAGNOSIS — M8580 Other specified disorders of bone density and structure, unspecified site: Secondary | ICD-10-CM

## 2015-05-14 DIAGNOSIS — I10 Essential (primary) hypertension: Secondary | ICD-10-CM

## 2015-05-14 MED ORDER — BUDESONIDE-FORMOTEROL FUMARATE 160-4.5 MCG/ACT IN AERO: INHALATION_SPRAY | RESPIRATORY_TRACT | Status: DC

## 2015-05-14 MED ORDER — FENOFIBRATE MICRONIZED 134 MG PO CAPS: ORAL_CAPSULE | ORAL | Status: DC

## 2015-05-14 MED ORDER — AMLODIPINE BESYLATE 5 MG PO TABS: ORAL_TABLET | ORAL | Status: DC

## 2015-05-14 MED ORDER — ATORVASTATIN CALCIUM 80 MG PO TABS: 80.0000 mg | ORAL_TABLET | Freq: Every day | ORAL | Status: DC

## 2015-05-14 MED ORDER — LEVOTHYROXINE SODIUM 75 MCG PO TABS: ORAL_TABLET | ORAL | Status: DC

## 2015-05-14 MED ORDER — MONTELUKAST SODIUM 10 MG PO TABS: 10.0000 mg | ORAL_TABLET | Freq: Every day | ORAL | Status: DC

## 2015-05-14 MED ORDER — BUDESONIDE-FORMOTEROL FUMARATE 160-4.5 MCG/ACT IN AERO
INHALATION_SPRAY | RESPIRATORY_TRACT | Status: DC
Start: 2015-05-14 — End: 2015-08-22

## 2015-05-14 NOTE — Addendum Note (Signed)
Addended by: Rolena Infante on: 05/14/2015 02:22 PM   Modules accepted: Orders

## 2015-05-14 NOTE — Addendum Note (Signed)
Addended by: Chevis Pretty on: 05/14/2015 10:34 AM   Modules accepted: Orders

## 2015-05-14 NOTE — Patient Instructions (Signed)
Bone Health Bones protect organs, store calcium, and anchor muscles. Good health habits, such as eating nutritious foods and exercising regularly, are important for maintaining healthy bones. They can also help to prevent a condition that causes bones to lose density and become weak and brittle (osteoporosis). WHY IS BONE MASS IMPORTANT? Bone mass refers to the amount of bone tissue that you have. The higher your bone mass, the stronger your bones. An important step toward having healthy bones throughout life is to have strong and dense bones during childhood. A young adult who has a high bone mass is more likely to have a high bone mass later in life. Bone mass at its greatest it is called peak bone mass. A large decline in bone mass occurs in older adults. In women, it occurs about the time of menopause. During this time, it is important to practice good health habits, because if more bone is lost than what is replaced, the bones will become less healthy and more likely to break (fracture). If you find that you have a low bone mass, you may be able to prevent osteoporosis or further bone loss by changing your diet and lifestyle. HOW CAN I FIND OUT IF MY BONE MASS IS LOW? Bone mass can be measured with an X-ray test that is called a bone mineral density (BMD) test. This test is recommended for all women who are age 65 or older. It may also be recommended for men who are age 70 or older, or for people who are more likely to develop osteoporosis due to:  Having bones that break easily.  Having a long-term disease that weakens bones, such as kidney disease or rheumatoid arthritis.  Having menopause earlier than normal.  Taking medicine that weakens bones, such as steroids, thyroid hormones, or hormone treatment for breast cancer or prostate cancer.  Smoking.  Drinking three or more alcoholic drinks each day. WHAT ARE THE NUTRITIONAL RECOMMENDATIONS FOR HEALTHY BONES? To have healthy bones, you need  to get enough of the right minerals and vitamins. Most nutrition experts recommend getting these nutrients from the foods that you eat. Nutritional recommendations vary from person to person. Ask your health care provider what is healthy for you. Here are some general guidelines. Calcium Recommendations Calcium is the most important (essential) mineral for bone health. Most people can get enough calcium from their diet, but supplements may be recommended for people who are at risk for osteoporosis. Good sources of calcium include:  Dairy products, such as low-fat or nonfat milk, cheese, and yogurt.  Dark green leafy vegetables, such as bok choy and broccoli.  Calcium-fortified foods, such as orange juice, cereal, bread, soy beverages, and tofu products.  Nuts, such as almonds. Follow these recommended amounts for daily calcium intake:  Children, age 1-3: 700 mg.  Children, age 4-8: 1,000 mg.  Children, age 9-13: 1,300 mg.  Teens, age 14-18: 1,300 mg.  Adults, age 19-50: 1,000 mg.  Adults, age 51-70:  Men: 1,000 mg.  Women: 1,200 mg.  Adults, age 71 or older: 1,200 mg.  Pregnant and breastfeeding females:  Teens: 1,300 mg.  Adults: 1,000 mg. Vitamin D Recommendations Vitamin D is the most essential vitamin for bone health. It helps the body to absorb calcium. Sunlight stimulates the skin to make vitamin D, so be sure to get enough sunlight. If you live in a cold climate or you do not get outside often, your health care provider may recommend that you take vitamin D supplements. Good   sources of vitamin D in your diet include:  Egg yolks.  Saltwater fish.  Milk and cereal fortified with vitamin D. Follow these recommended amounts for daily vitamin D intake:  Children and teens, age 1-18: 600 international units.  Adults, age 50 or younger: 400-800 international units.  Adults, age 51 or older: 800-1,000 international units. Other Nutrients Other nutrients for bone  health include:  Phosphorus. This mineral is found in meat, poultry, dairy foods, nuts, and legumes. The recommended daily intake for adult men and adult women is 700 mg.  Magnesium. This mineral is found in seeds, nuts, dark green vegetables, and legumes. The recommended daily intake for adult men is 400-420 mg. For adult women, it is 310-320 mg.  Vitamin K. This vitamin is found in green leafy vegetables. The recommended daily intake is 120 mg for adult men and 90 mg for adult women. WHAT TYPE OF PHYSICAL ACTIVITY IS BEST FOR BUILDING AND MAINTAINING HEALTHY BONES? Weight-bearing and strength-building activities are important for building and maintaining peak bone mass. Weight-bearing activities cause muscles and bones to work against gravity. Strength-building activities increases muscle strength that supports bones. Weight-bearing and muscle-building activities include:  Walking and hiking.  Jogging and running.  Dancing.  Gym exercises.  Lifting weights.  Tennis and racquetball.  Climbing stairs.  Aerobics. Adults should get at least 30 minutes of moderate physical activity on most days. Children should get at least 60 minutes of moderate physical activity on most days. Ask your health care provide what type of exercise is best for you. WHERE CAN I FIND MORE INFORMATION? For more information, check out the following websites:  National Osteoporosis Foundation: http://nof.org/learn/basics  National Institutes of Health: http://www.niams.nih.gov/Health_Info/Bone/Bone_Health/bone_health_for_life.asp   This information is not intended to replace advice given to you by your health care provider. Make sure you discuss any questions you have with your health care provider.   Document Released: 07/11/2003 Document Revised: 09/04/2014 Document Reviewed: 04/25/2014 Elsevier Interactive Patient Education 2016 [employer] Inc.  

## 2015-05-14 NOTE — Progress Notes (Signed)
Subjective:    Patient ID: Karla Maxwell, female    DOB: [birth year], 65 y.o.   MRN: [id number]  Patient here today for follow up of chronic medical problem- no changes since last visit- no complaints.  Hypertension This is a chronic problem. The current episode started more than 1 year ago. The problem is unchanged. The problem is controlled. Pertinent negatives include no chest pain, headaches, palpitations or shortness of breath. Risk factors for coronary artery disease include dyslipidemia, female gender, post-menopausal state, obesity and sedentary lifestyle. Past treatments include calcium channel blockers. The current treatment provides moderate improvement. Compliance problems include diet and exercise.  Hypertensive end-organ damage includes CAD/MI and a thyroid problem.  Hyperlipidemia This is a chronic problem. The current episode started more than 1 year ago. The problem is uncontrolled. Recent lipid tests were reviewed and are variable. Pertinent negatives include no chest pain, myalgias or shortness of breath. Current antihyperlipidemic treatment includes statins and fibric acid derivatives. The current treatment provides no improvement of lipids. Compliance problems include adherence to diet and adherence to exercise.  Risk factors for coronary artery disease include dyslipidemia, hypertension and post-menopausal.  Gastroesophageal Reflux She reports no chest pain. Pertinent negatives include no fatigue.  Thyroid Problem Presents for follow-up (hypothyroidism) visit. Patient reports no diarrhea, fatigue, heat intolerance, palpitations or visual change. Her past medical history is significant for hyperlipidemia.  bronchial asthma Symbicort and singulair daily- Occassional smoker- 3-5 a day. Has not needed albuterol in a few months  Review of Systems  Constitutional: Negative for fatigue.  Respiratory: Negative for shortness of breath.   Cardiovascular: Negative for chest pain and  palpitations.  Gastrointestinal: Negative for diarrhea.  Endocrine: Negative for heat intolerance.  Genitourinary: Negative.   Musculoskeletal: Negative for myalgias.  Neurological: Negative for headaches.  Psychiatric/Behavioral: Negative.        Objective:   Physical Exam  Constitutional: She is oriented to person, place, and time. She appears well-developed and well-nourished.  HENT:  Nose: Nose normal.  Mouth/Throat: Oropharynx is clear and moist.  Eyes: EOM are normal.  Neck: Trachea normal, normal range of motion and full passive range of motion without pain. Neck supple. No JVD present. Carotid bruit is not present. No thyromegaly present.  Cardiovascular: Normal rate, regular rhythm, normal heart sounds and intact distal pulses.  Exam reveals no gallop and no friction rub.   No murmur heard. Pulmonary/Chest: Effort normal and breath sounds normal.  Abdominal: Soft. Bowel sounds are normal. She exhibits no distension and no mass. There is no tenderness.  Musculoskeletal: Normal range of motion. She exhibits edema (1+ pitting edema bil).  Lymphadenopathy:    She has no cervical adenopathy.  Neurological: She is alert and oriented to person, place, and time. She has normal reflexes.  Skin: Skin is warm and dry.  Psychiatric: She has a normal mood and affect. Her behavior is normal. Judgment and thought content normal.    BP 136/88 mmHg  Pulse 85  Temp(Src) 97 F (36.1 C) (Oral)  Ht _0  (1.575 m)  Wt 186 lb (84.369 kg)  BMI 34.01 kg/m2         Assessment & Plan:  1. Essential hypertension Do not add salt to diet - amLODipine (NORVASC) 5 MG tablet; TAKE ONE TABLET BY MOUTH ONE  TIME DAILY  Dispense: 90 tablet; Refill: 1 - CMP14+EGFR  2. Bronchial asthma, moderate persistent, uncomplicated - budesonide-formoterol (SYMBICORT) 160-4.5 MCG/ACT inhaler; INHALE 2 PUFFS BY MOUTH INTO  THE LUNGS TWICE  A DAY AS INSTRUCTED  Dispense: 33 g; Refill: 1  3. Gastroesophageal  reflux disease, esophagitis presence not specified Avoid spicy foods Do not eat 2 hours prior to bedtime  4. Hypothyroidism, unspecified hypothyroidism type - levothyroxine (SYNTHROID, LEVOTHROID) 75 MCG tablet; TAKE 1 TABLET (75 MCG TOTAL)   BY MOUTH DAILY.  Dispense: 90 tablet; Refill: 1 - Thyroid Panel With TSH  5. Osteopenia Weight bearing exercises  6. Hyperlipidemia Low fat diet - atorvastatin (LIPITOR) 80 MG tablet; Take 1 tablet (80 mg total) by mouth daily.  Dispense: 90 tablet; Refill: 1 - fenofibrate micronized (LOFIBRA) 134 MG capsule; TAKE ONE CAPSULE BY MOUTH ONE TIME DAILY BEFORE BREAKFAST  Dispense: 90 capsule; Refill: 1 - Lipid panel  7. BMI 33.0-33.9,adult Discussed diet and exercise for person with BMI >25 Will recheck weight in 3-6 months     Labs pending Health maintenance reviewed Diet and exercise encouraged Continue all meds Follow up  In 3 month   [place], FNP

## 2015-05-15 LAB — CMP14+EGFR
ALK PHOS: 63 IU/L (ref 39–117)
ALT: 19 IU/L (ref 0–32)
AST: 17 IU/L (ref 0–40)
Albumin/Globulin Ratio: 1.8 (ref 1.1–2.5)
Albumin: 4.6 g/dL (ref 3.6–4.8)
BILIRUBIN TOTAL: 0.4 mg/dL (ref 0.0–1.2)
BUN / CREAT RATIO: 19 (ref 11–26)
BUN: 14 mg/dL (ref 8–27)
CHLORIDE: 100 mmol/L (ref 96–106)
CO2: 21 mmol/L (ref 18–29)
Calcium: 10.1 mg/dL (ref 8.7–10.3)
Creatinine, Ser: 0.75 mg/dL (ref 0.57–1.00)
GFR calc non Af Amer: 85 mL/min/{1.73_m2} (ref >59)
GFR, EST AFRICAN AMERICAN: 97 mL/min/{1.73_m2} (ref >59)
GLUCOSE: 114 mg/dL — AB (ref 65–99)
Globulin, Total: 2.6 g/dL (ref 1.5–4.5)
POTASSIUM: 4.6 mmol/L (ref 3.5–5.2)
SODIUM: 141 mmol/L (ref 134–144)
Total Protein: 7.2 g/dL (ref 6.0–8.5)

## 2015-05-15 LAB — LIPID PANEL
CHOLESTEROL TOTAL: 145 mg/dL (ref 100–199)
Chol/HDL Ratio: 3.3 ratio units (ref 0.0–4.4)
HDL: 44 mg/dL (ref >39)
LDL Calculated: 71 mg/dL (ref 0–99)
Triglycerides: 150 mg/dL — ABNORMAL HIGH (ref 0–149)
VLDL Cholesterol Cal: 30 mg/dL (ref 5–40)

## 2015-05-15 LAB — THYROID PANEL WITH TSH
FREE THYROXINE INDEX: 2.5 (ref 1.2–4.9)
T3 UPTAKE RATIO: 22 % — AB (ref 24–39)
T4 TOTAL: 11.2 ug/dL (ref 4.5–12.0)
TSH: 1.5 u[IU]/mL (ref 0.450–4.500)

## 2015-05-16 ENCOUNTER — Telehealth: Payer: Self-pay | Admitting: Nurse Practitioner

## 2015-05-16 MED ORDER — OMEPRAZOLE 40 MG PO CPDR
40.0000 mg | DELAYED_RELEASE_CAPSULE | Freq: Every day | ORAL | Status: DC
Start: 2015-05-16 — End: 2015-08-22

## 2015-05-16 NOTE — Telephone Encounter (Signed)
Pt needed rx for omeprazole  Pt aware to use some ice on area and can take OTC antihistamine.

## 2015-08-22 ENCOUNTER — Encounter: Payer: Self-pay | Admitting: Nurse Practitioner

## 2015-08-22 ENCOUNTER — Ambulatory Visit (INDEPENDENT_AMBULATORY_CARE_PROVIDER_SITE_OTHER): Payer: PPO | Admitting: Nurse Practitioner

## 2015-08-22 VITALS — BP 140/65 | HR 70 | Temp 96.9°F | Ht 62.0 in | Wt 188.6 lb

## 2015-08-22 DIAGNOSIS — Z6833 Body mass index (BMI) 33.0-33.9, adult: Secondary | ICD-10-CM

## 2015-08-22 DIAGNOSIS — Z1212 Encounter for screening for malignant neoplasm of rectum: Secondary | ICD-10-CM

## 2015-08-22 DIAGNOSIS — Z1159 Encounter for screening for other viral diseases: Secondary | ICD-10-CM

## 2015-08-22 DIAGNOSIS — E785 Hyperlipidemia, unspecified: Secondary | ICD-10-CM | POA: Diagnosis not present

## 2015-08-22 DIAGNOSIS — E039 Hypothyroidism, unspecified: Secondary | ICD-10-CM | POA: Diagnosis not present

## 2015-08-22 DIAGNOSIS — J4540 Moderate persistent asthma, uncomplicated: Secondary | ICD-10-CM | POA: Diagnosis not present

## 2015-08-22 DIAGNOSIS — I10 Essential (primary) hypertension: Secondary | ICD-10-CM

## 2015-08-22 DIAGNOSIS — K219 Gastro-esophageal reflux disease without esophagitis: Secondary | ICD-10-CM

## 2015-08-22 MED ORDER — OMEPRAZOLE 40 MG PO CPDR: 40.0000 mg | DELAYED_RELEASE_CAPSULE | Freq: Every day | ORAL | Status: DC

## 2015-08-22 MED ORDER — FENOFIBRATE MICRONIZED 134 MG PO CAPS: ORAL_CAPSULE | ORAL | Status: DC

## 2015-08-22 MED ORDER — LEVOTHYROXINE SODIUM 75 MCG PO TABS: ORAL_TABLET | ORAL | Status: DC

## 2015-08-22 MED ORDER — ATORVASTATIN CALCIUM 80 MG PO TABS: 80.0000 mg | ORAL_TABLET | Freq: Every day | ORAL | Status: DC

## 2015-08-22 MED ORDER — AMLODIPINE BESYLATE 5 MG PO TABS: ORAL_TABLET | ORAL | Status: DC

## 2015-08-22 MED ORDER — MONTELUKAST SODIUM 10 MG PO TABS: 10.0000 mg | ORAL_TABLET | Freq: Every day | ORAL | Status: DC

## 2015-08-22 MED ORDER — BUDESONIDE-FORMOTEROL FUMARATE 160-4.5 MCG/ACT IN AERO: INHALATION_SPRAY | RESPIRATORY_TRACT | Status: DC

## 2015-08-22 NOTE — Patient Instructions (Signed)

## 2015-08-22 NOTE — Progress Notes (Signed)
Subjective:    Patient ID: Karla Maxwell, female    DOB: 06-Oct-1950, 65 y.o.   MRN: [id number]  Patient here today for follow up of chronic medical problem-  Complaints today of left arm pain that feels like someone is pinching her when she bends her arm. Edema in feet and legs has worsened  Hypertension This is a chronic problem. The current episode started more than 1 year ago. The problem is unchanged. The problem is controlled. Pertinent negatives include no chest pain, headaches, palpitations or shortness of breath. Risk factors for coronary artery disease include dyslipidemia, female gender, post-menopausal state, obesity and sedentary lifestyle. Past treatments include calcium channel blockers. The current treatment provides moderate improvement. Compliance problems include diet and exercise.  Hypertensive end-organ damage includes CAD/MI and a thyroid problem.  Hyperlipidemia This is a chronic problem. The current episode started more than 1 year ago. The problem is uncontrolled. Recent lipid tests were reviewed and are variable. Pertinent negatives include no chest pain, myalgias or shortness of breath. Current antihyperlipidemic treatment includes statins and fibric acid derivatives. The current treatment provides no improvement of lipids. Compliance problems include adherence to diet and adherence to exercise.  Risk factors for coronary artery disease include dyslipidemia, hypertension and post-menopausal.  Gastroesophageal Reflux She reports no chest pain. Pertinent negatives include no fatigue.  Thyroid Problem Presents for follow-up (hypothyroidism) visit. Patient reports no diarrhea, fatigue, heat intolerance, palpitations or visual change. Her past medical history is significant for hyperlipidemia.  bronchial asthma Symbicort and singulair daily- Occassional smoker- 3-5 a day. Has not needed albuterol in a few months  Review of Systems  Constitutional: Negative.  Negative for  fatigue.  HENT: Negative.   Eyes: Negative.   Respiratory: Negative for shortness of breath.   Cardiovascular: Positive for leg swelling. Negative for chest pain and palpitations.  Gastrointestinal: Negative.  Negative for diarrhea.  Endocrine: Negative.  Negative for heat intolerance.  Genitourinary: Negative.   Musculoskeletal: Negative.  Negative for myalgias.       Propping feet up to help with edema  Allergic/Immunologic: Positive for environmental allergies.  Neurological: Negative.  Negative for headaches.  Hematological: Negative.   Psychiatric/Behavioral: Negative.        Objective:   Physical Exam  Constitutional: She is oriented to person, place, and time. Vital signs are normal. She appears well-developed and well-nourished.  HENT:  Head: Atraumatic.  Right Ear: Hearing, tympanic membrane, external ear and ear canal normal.  Left Ear: Hearing, tympanic membrane, external ear and ear canal normal.  Nose: Nose normal.  Mouth/Throat: Uvula is midline, oropharynx is clear and moist and mucous membranes are normal.  Eyes: Conjunctivae and EOM are normal. Pupils are equal, round, and reactive to light.  Neck: Trachea normal, normal range of motion and full passive range of motion without pain. Neck supple. No JVD present. Carotid bruit is not present. No thyromegaly present.  Cardiovascular: Normal rate, regular rhythm, normal heart sounds, intact distal pulses and normal pulses.  Exam reveals no gallop and no friction rub.   No murmur heard. Pulmonary/Chest: Effort normal and breath sounds normal.  Abdominal: Soft. Normal appearance and bowel sounds are normal. She exhibits no distension and no mass. There is no tenderness.  Musculoskeletal: Edema: 1+ pitting edema bil.  Soreness mid anterior bend of arm  Lymphadenopathy:    She has no cervical adenopathy.  Neurological: She is alert and oriented to person, place, and time. She has normal reflexes.  Skin: Skin is warm,  dry  and intact.  Psychiatric: She has a normal mood and affect. Her speech is normal and behavior is normal. Judgment and thought content normal. Cognition and memory are normal.  Vitals reviewed.   BP 140/65 mmHg  Pulse 70  Temp(Src) 96.9 F (36.1 C) (Oral)  Ht '5\' 2"'  (1.575 m)  Wt 188 lb 9.6 oz (85.548 kg)  BMI 34.49 kg/m2        Assessment & Plan:    1. Essential hypertension Do not add salt to diet - amLODipine (NORVASC) 5 MG tablet; TAKE ONE TABLET BY MOUTH ONE  TIME DAILY  Dispense: 90 tablet; Refill: 1 - CMP14+EGFR  2. Bronchial asthma, moderate persistent, uncomplicated - budesonide-formoterol (SYMBICORT) 160-4.5 MCG/ACT inhaler; INHALE 2 PUFFS BY MOUTH INTO  THE LUNGS TWICE A DAY AS INSTRUCTED  Dispense: 33 g; Refill: 1  3. Gastroesophageal reflux disease, esophagitis presence not specified No spicy foods. Do not eat 2 hours before bedtime - omeprazole (PRILOSEC) 40 MG capsule; Take 1 capsule (40 mg total) by mouth daily.  Dispense: 90 capsule; Refill: 1  4. Hypothyroidism, unspecified hypothyroidism type - levothyroxine (SYNTHROID, LEVOTHROID) 75 MCG tablet; TAKE 1 TABLET (75 MCG TOTAL)   BY MOUTH DAILY.  Dispense: 90 tablet; Refill: 1  5. Hyperlipidemia Low fat diet - atorvastatin (LIPITOR) 80 MG tablet; Take 1 tablet (80 mg total) by mouth daily.  Dispense: 90 tablet; Refill: 1 - fenofibrate micronized (LOFIBRA) 134 MG capsule; TAKE ONE CAPSULE BY MOUTH ONE TIME DAILY BEFORE BREAKFAST  Dispense: 90 capsule; Refill: 1 - Lipid panel  6. BMI 33.0-33.9,adult Low fat diet    7. Screening for malignant neoplasm of the rectum - Fecal occult blood, imunochemical; Future  8. Need for hepatitis C screening test - Hepatitis C antibody   Continue all meds Labs pending Health Maintenance reviewed Diet and exercise encouraged RTO 6 months  Mary-Margaret Hassell Done, [place] FNP Student

## 2015-08-23 LAB — CMP14+EGFR
ALBUMIN: 4.8 g/dL (ref 3.6–4.8)
ALT: 25 IU/L (ref 0–32)
AST: 29 IU/L (ref 0–40)
Albumin/Globulin Ratio: 1.7 (ref 1.2–2.2)
Alkaline Phosphatase: 58 IU/L (ref 39–117)
BUN / CREAT RATIO: 12 (ref 12–28)
BUN: 10 mg/dL (ref 8–27)
Bilirubin Total: 0.3 mg/dL (ref 0.0–1.2)
CALCIUM: 10.5 mg/dL — AB (ref 8.7–10.3)
CO2: 21 mmol/L (ref 18–29)
CREATININE: 0.84 mg/dL (ref 0.57–1.00)
Chloride: 99 mmol/L (ref 96–106)
GFR calc non Af Amer: 74 mL/min/{1.73_m2} (ref >59)
GFR, EST AFRICAN AMERICAN: 85 mL/min/{1.73_m2} (ref >59)
GLUCOSE: 107 mg/dL — AB (ref 65–99)
Globulin, Total: 2.9 g/dL (ref 1.5–4.5)
Potassium: 4.5 mmol/L (ref 3.5–5.2)
Sodium: 140 mmol/L (ref 134–144)
TOTAL PROTEIN: 7.7 g/dL (ref 6.0–8.5)

## 2015-08-23 LAB — LIPID PANEL
Chol/HDL Ratio: 3.2 ratio units (ref 0.0–4.4)
Cholesterol, Total: 142 mg/dL (ref 100–199)
HDL: 45 mg/dL (ref >39)
LDL CALC: 63 mg/dL (ref 0–99)
Triglycerides: 172 mg/dL — ABNORMAL HIGH (ref 0–149)
VLDL CHOLESTEROL CAL: 34 mg/dL (ref 5–40)

## 2015-08-23 LAB — HEPATITIS C ANTIBODY

## 2015-08-29 ENCOUNTER — Other Ambulatory Visit: Payer: PPO

## 2015-08-29 DIAGNOSIS — Z1212 Encounter for screening for malignant neoplasm of rectum: Secondary | ICD-10-CM

## 2015-09-04 LAB — FECAL OCCULT BLOOD, IMMUNOCHEMICAL: FECAL OCCULT BLD: NEGATIVE

## 2016-02-24 ENCOUNTER — Encounter: Payer: Self-pay | Admitting: Nurse Practitioner

## 2016-02-24 ENCOUNTER — Ambulatory Visit (INDEPENDENT_AMBULATORY_CARE_PROVIDER_SITE_OTHER): Payer: PPO | Admitting: Nurse Practitioner

## 2016-02-24 VITALS — BP 132/84 | HR 64 | Temp 97.9°F | Ht 62.0 in | Wt 186.0 lb

## 2016-02-24 DIAGNOSIS — E782 Mixed hyperlipidemia: Secondary | ICD-10-CM | POA: Diagnosis not present

## 2016-02-24 DIAGNOSIS — M8580 Other specified disorders of bone density and structure, unspecified site: Secondary | ICD-10-CM

## 2016-02-24 DIAGNOSIS — J4520 Mild intermittent asthma, uncomplicated: Secondary | ICD-10-CM

## 2016-02-24 DIAGNOSIS — F330 Major depressive disorder, recurrent, mild: Secondary | ICD-10-CM | POA: Diagnosis not present

## 2016-02-24 DIAGNOSIS — Z6833 Body mass index (BMI) 33.0-33.9, adult: Secondary | ICD-10-CM | POA: Diagnosis not present

## 2016-02-24 DIAGNOSIS — K219 Gastro-esophageal reflux disease without esophagitis: Secondary | ICD-10-CM | POA: Diagnosis not present

## 2016-02-24 DIAGNOSIS — E039 Hypothyroidism, unspecified: Secondary | ICD-10-CM | POA: Diagnosis not present

## 2016-02-24 DIAGNOSIS — I10 Essential (primary) hypertension: Secondary | ICD-10-CM | POA: Diagnosis not present

## 2016-02-24 MED ORDER — BUDESONIDE-FORMOTEROL FUMARATE 160-4.5 MCG/ACT IN AERO: INHALATION_SPRAY | RESPIRATORY_TRACT | 1 refills | Status: DC

## 2016-02-24 MED ORDER — OMEPRAZOLE 40 MG PO CPDR
40.0000 mg | DELAYED_RELEASE_CAPSULE | Freq: Every day | ORAL | 1 refills | Status: DC
Start: 2016-02-24 — End: 2016-05-26

## 2016-02-24 MED ORDER — FENOFIBRATE MICRONIZED 134 MG PO CAPS: ORAL_CAPSULE | ORAL | 1 refills | Status: DC

## 2016-02-24 MED ORDER — ATORVASTATIN CALCIUM 80 MG PO TABS: 80.0000 mg | ORAL_TABLET | Freq: Every day | ORAL | 1 refills | Status: DC

## 2016-02-24 MED ORDER — AMLODIPINE BESYLATE 5 MG PO TABS: ORAL_TABLET | ORAL | 1 refills | Status: DC

## 2016-02-24 MED ORDER — LEVOTHYROXINE SODIUM 75 MCG PO TABS: ORAL_TABLET | ORAL | 1 refills | Status: DC

## 2016-02-24 NOTE — Progress Notes (Signed)
Subjective:    Patient ID: Karla Maxwell, female    DOB: 07-Dec-1950, 65 y.o.   MRN: [id number]  Patient here today for follow up of chronic medical problem- no changes since last visit- no complaints.  Outpatient Encounter Prescriptions as of 02/24/2016  Medication Sig  . amLODipine (NORVASC) 5 MG tablet TAKE ONE TABLET BY MOUTH ONE  TIME DAILY  . aspirin EC 81 MG tablet Take 162 mg by mouth daily.  Marland Kitchen atorvastatin (LIPITOR) 80 MG tablet Take 1 tablet (80 mg total) by mouth daily.  . budesonide-formoterol (SYMBICORT) 160-4.5 MCG/ACT inhaler INHALE 2 PUFFS BY MOUTH INTO  THE LUNGS TWICE A DAY AS INSTRUCTED  . fenofibrate micronized (LOFIBRA) 134 MG capsule TAKE ONE CAPSULE BY MOUTH ONE TIME DAILY BEFORE BREAKFAST  . levothyroxine (SYNTHROID, LEVOTHROID) 75 MCG tablet TAKE 1 TABLET (75 MCG TOTAL)   BY MOUTH DAILY.  . montelukast (SINGULAIR) 10 MG tablet Take 1 tablet (10 mg total) by mouth at bedtime.  Marland Kitchen omeprazole (PRILOSEC) 40 MG capsule Take 1 capsule (40 mg total) by mouth daily.   No facility-administered encounter medications on file as of 02/24/2016.     Hypertension  This is a chronic problem. The current episode started more than 1 year ago. The problem is unchanged. The problem is controlled. Pertinent negatives include no chest pain, headaches, palpitations or shortness of breath. Risk factors for coronary artery disease include dyslipidemia, female gender, post-menopausal state, obesity and sedentary lifestyle. Past treatments include calcium channel blockers. The current treatment provides moderate improvement. Compliance problems include diet and exercise.  Hypertensive end-organ damage includes CAD/MI and a thyroid problem.  Hyperlipidemia  This is a chronic problem. The current episode started more than 1 year ago. The problem is uncontrolled. Recent lipid tests were reviewed and are variable. Pertinent negatives include no chest pain, myalgias or shortness of breath. Current  antihyperlipidemic treatment includes statins and fibric acid derivatives. The current treatment provides no improvement of lipids. Compliance problems include adherence to diet and adherence to exercise.  Risk factors for coronary artery disease include dyslipidemia, hypertension and post-menopausal.  Gastroesophageal Reflux  She reports no chest pain. Pertinent negatives include no fatigue.  Thyroid Problem  Presents for follow-up (hypothyroidism) visit. Patient reports no diarrhea, fatigue, heat intolerance, palpitations or visual change. Her past medical history is significant for hyperlipidemia.  bronchial asthma Symbicort and singulair daily- Occassional smoker- 3-5 a day. Has not needed albuterol in a few months   Depression screen [facility] 2/9 02/24/2016 05/14/2015 12/19/2014 08/01/2014 06/27/2014  Decreased Interest 3 0 1 0 0  Down, Depressed, Hopeless 3 0 1 0 0  PHQ - 2 Score 6 0 2 0 0  Altered sleeping 3 - 0 - -  Tired, decreased energy 3 - 1 - -  Change in appetite 3 - 0 - -  Feeling bad or failure about yourself  3 - 0 - -  Trouble concentrating 3 - 0 - -  Moving slowly or fidgety/restless 0 - 0 - -  Suicidal thoughts 0 - 0 - -  PHQ-9 Score 21 - 3 - -  Difficult doing work/chores - - Not difficult at all - -    Review of Systems  Constitutional: Negative for fatigue.  Respiratory: Negative for shortness of breath.   Cardiovascular: Negative for chest pain and palpitations.  Gastrointestinal: Negative for diarrhea.  Endocrine: Negative for heat intolerance.  Genitourinary: Negative.   Musculoskeletal: Negative for myalgias.  Neurological: Negative for headaches.  Psychiatric/Behavioral: Negative.  Objective:   Physical Exam  Constitutional: She is oriented to person, place, and time. She appears well-developed and well-nourished.  HENT:  Nose: Nose normal.  Mouth/Throat: Oropharynx is clear and moist.  Eyes: EOM are normal.  Neck: Trachea normal, normal range of  motion and full passive range of motion without pain. Neck supple. No JVD present. Carotid bruit is not present. No thyromegaly present.  Cardiovascular: Normal rate, regular rhythm, normal heart sounds and intact distal pulses.  Exam reveals no gallop and no friction rub.   No murmur heard. Pulmonary/Chest: Effort normal and breath sounds normal.  Abdominal: Soft. Bowel sounds are normal. She exhibits no distension and no mass. There is no tenderness.  Musculoskeletal: Normal range of motion. She exhibits edema (1+ pitting edema bil).  Lymphadenopathy:    She has no cervical adenopathy.  Neurological: She is alert and oriented to person, place, and time. She has normal reflexes.  Skin: Skin is warm and dry.  Psychiatric: She has a normal mood and affect. Her behavior is normal. Judgment and thought content normal.   BP 132/84 (BP Location: Left Arm, Cuff Size: Normal)   Pulse 64   Temp 97.9 F (36.6 C) (Oral)   Ht '5\' 2"'  (1.575 m)   Wt 186 lb (84.4 kg)   BMI 34.02 kg/m      Assessment & Plan:  1. Essential hypertension Do not add salt to diet - amLODipine (NORVASC) 5 MG tablet; TAKE ONE TABLET BY MOUTH ONE  TIME DAILY  Dispense: 90 tablet; Refill: 1 - CMP14+EGFR  2. Mild intermittent asthma without complication Smoking cessation encouraged - budesonide-formoterol (SYMBICORT) 160-4.5 MCG/ACT inhaler; INHALE 2 PUFFS BY MOUTH INTO  THE LUNGS TWICE A DAY AS INSTRUCTED  Dispense: 33 g; Refill: 1  3. Gastroesophageal reflux disease, esophagitis presence not specified Avoid spicy foods Do not eat 2 hours prior to bedtime - omeprazole (PRILOSEC) 40 MG capsule; Take 1 capsule (40 mg total) by mouth daily.  Dispense: 90 capsule; Refill: 1  4. Acquired hypothyroidism - levothyroxine (SYNTHROID, LEVOTHROID) 75 MCG tablet; TAKE 1 TABLET (75 MCG TOTAL)   BY MOUTH DAILY.  Dispense: 90 tablet; Refill: 1  5. Osteopenia, unspecified location Weight bearing exercises  6. BMI  33.0-33.9,adult Discussed diet and exercise for person with BMI >25 Will recheck weight in 3-6 months  7. Mixed hyperlipidemia Low fat diet - atorvastatin (LIPITOR) 80 MG tablet; Take 1 tablet (80 mg total) by mouth daily.  Dispense: 90 tablet; Refill: 1 - fenofibrate micronized (LOFIBRA) 134 MG capsule; TAKE ONE CAPSULE BY MOUTH ONE TIME DAILY BEFORE BREAKFAST  Dispense: 90 capsule; Refill: 1 - Lipid panel  8. depression Patient refuses medication  Patient to schedule mammogram Labs pending Health maintenance reviewed Diet and exercise encouraged Continue all meds Follow up  In 3 months   [place], FNP

## 2016-02-24 NOTE — Patient Instructions (Signed)
Bone Health Bones protect organs, store calcium, and anchor muscles. Good health habits, such as eating nutritious foods and exercising regularly, are important for maintaining healthy bones. They can also help to prevent a condition that causes bones to lose density and become weak and brittle (osteoporosis). WHY IS BONE MASS IMPORTANT? Bone mass refers to the amount of bone tissue that you have. The higher your bone mass, the stronger your bones. An important step toward having healthy bones throughout life is to have strong and dense bones during childhood. A young adult who has a high bone mass is more likely to have a high bone mass later in life. Bone mass at its greatest it is called peak bone mass. A large decline in bone mass occurs in older adults. In women, it occurs about the time of menopause. During this time, it is important to practice good health habits, because if more bone is lost than what is replaced, the bones will become less healthy and more likely to break (fracture). If you find that you have a low bone mass, you may be able to prevent osteoporosis or further bone loss by changing your diet and lifestyle. HOW CAN I FIND OUT IF MY BONE MASS IS LOW? Bone mass can be measured with an X-ray test that is called a bone mineral density (BMD) test. This test is recommended for all women who are age 65 or older. It may also be recommended for men who are age 70 or older, or for people who are more likely to develop osteoporosis due to:  Having bones that break easily.  Having a long-term disease that weakens bones, such as kidney disease or rheumatoid arthritis.  Having menopause earlier than normal.  Taking medicine that weakens bones, such as steroids, thyroid hormones, or hormone treatment for breast cancer or prostate cancer.  Smoking.  Drinking three or more alcoholic drinks each day. WHAT ARE THE NUTRITIONAL RECOMMENDATIONS FOR HEALTHY BONES? To have healthy bones, you need  to get enough of the right minerals and vitamins. Most nutrition experts recommend getting these nutrients from the foods that you eat. Nutritional recommendations vary from person to person. Ask your health care provider what is healthy for you. Here are some general guidelines. Calcium Recommendations Calcium is the most important (essential) mineral for bone health. Most people can get enough calcium from their diet, but supplements may be recommended for people who are at risk for osteoporosis. Good sources of calcium include:  Dairy products, such as low-fat or nonfat milk, cheese, and yogurt.  Dark green leafy vegetables, such as bok choy and broccoli.  Calcium-fortified foods, such as orange juice, cereal, bread, soy beverages, and tofu products.  Nuts, such as almonds. Follow these recommended amounts for daily calcium intake:  Children, age 1-3: 700 mg.  Children, age 4-8: 1,000 mg.  Children, age 9-13: 1,300 mg.  Teens, age 14-18: 1,300 mg.  Adults, age 19-50: 1,000 mg.  Adults, age 51-70:  Men: 1,000 mg.  Women: 1,200 mg.  Adults, age 71 or older: 1,200 mg.  Pregnant and breastfeeding females:  Teens: 1,300 mg.  Adults: 1,000 mg. Vitamin D Recommendations Vitamin D is the most essential vitamin for bone health. It helps the body to absorb calcium. Sunlight stimulates the skin to make vitamin D, so be sure to get enough sunlight. If you live in a cold climate or you do not get outside often, your health care provider may recommend that you take vitamin D supplements. Good   sources of vitamin D in your diet include:  Egg yolks.  Saltwater fish.  Milk and cereal fortified with vitamin D. Follow these recommended amounts for daily vitamin D intake:  Children and teens, age 1-18: 600 international units.  Adults, age 50 or younger: 400-800 international units.  Adults, age 51 or older: 800-1,000 international units. Other Nutrients Other nutrients for bone  health include:  Phosphorus. This mineral is found in meat, poultry, dairy foods, nuts, and legumes. The recommended daily intake for adult men and adult women is 700 mg.  Magnesium. This mineral is found in seeds, nuts, dark green vegetables, and legumes. The recommended daily intake for adult men is 400-420 mg. For adult women, it is 310-320 mg.  Vitamin K. This vitamin is found in green leafy vegetables. The recommended daily intake is 120 mg for adult men and 90 mg for adult women. WHAT TYPE OF PHYSICAL ACTIVITY IS BEST FOR BUILDING AND MAINTAINING HEALTHY BONES? Weight-bearing and strength-building activities are important for building and maintaining peak bone mass. Weight-bearing activities cause muscles and bones to work against gravity. Strength-building activities increases muscle strength that supports bones. Weight-bearing and muscle-building activities include:  Walking and hiking.  Jogging and running.  Dancing.  Gym exercises.  Lifting weights.  Tennis and racquetball.  Climbing stairs.  Aerobics. Adults should get at least 30 minutes of moderate physical activity on most days. Children should get at least 60 minutes of moderate physical activity on most days. Ask your health care provide what type of exercise is best for you. WHERE CAN I FIND MORE INFORMATION? For more information, check out the following websites:  National Osteoporosis Foundation: http://nof.org/learn/basics  National Institutes of Health: http://www.niams.nih.gov/Health_Info/Bone/Bone_Health/bone_health_for_life.asp   This information is not intended to replace advice given to you by your health care provider. Make sure you discuss any questions you have with your health care provider.   Document Released: 07/11/2003 Document Revised: 09/04/2014 Document Reviewed: 04/25/2014 Elsevier Interactive Patient Education 2016 [employer] Inc.  

## 2016-02-25 LAB — CMP14+EGFR
ALBUMIN: 4.8 g/dL (ref 3.6–4.8)
ALK PHOS: 56 IU/L (ref 39–117)
ALT: 17 IU/L (ref 0–32)
AST: 13 IU/L (ref 0–40)
Albumin/Globulin Ratio: 2.1 (ref 1.2–2.2)
BILIRUBIN TOTAL: 0.3 mg/dL (ref 0.0–1.2)
BUN / CREAT RATIO: 15 (ref 12–28)
BUN: 13 mg/dL (ref 8–27)
CHLORIDE: 102 mmol/L (ref 96–106)
CO2: 23 mmol/L (ref 18–29)
CREATININE: 0.85 mg/dL (ref 0.57–1.00)
Calcium: 10.1 mg/dL (ref 8.7–10.3)
GFR calc Af Amer: 83 mL/min/{1.73_m2} (ref >59)
GFR calc non Af Amer: 72 mL/min/{1.73_m2} (ref >59)
GLOBULIN, TOTAL: 2.3 g/dL (ref 1.5–4.5)
Glucose: 119 mg/dL — ABNORMAL HIGH (ref 65–99)
POTASSIUM: 4.5 mmol/L (ref 3.5–5.2)
SODIUM: 140 mmol/L (ref 134–144)
Total Protein: 7.1 g/dL (ref 6.0–8.5)

## 2016-02-25 LAB — LIPID PANEL
CHOLESTEROL TOTAL: 133 mg/dL (ref 100–199)
Chol/HDL Ratio: 3.5 ratio units (ref 0.0–4.4)
HDL: 38 mg/dL — ABNORMAL LOW (ref >39)
LDL CALC: 52 mg/dL (ref 0–99)
TRIGLYCERIDES: 214 mg/dL — AB (ref 0–149)
VLDL Cholesterol Cal: 43 mg/dL — ABNORMAL HIGH (ref 5–40)

## 2016-02-28 ENCOUNTER — Telehealth: Payer: Self-pay | Admitting: Nurse Practitioner

## 2016-02-28 ENCOUNTER — Ambulatory Visit (INDEPENDENT_AMBULATORY_CARE_PROVIDER_SITE_OTHER): Payer: PPO

## 2016-02-28 ENCOUNTER — Encounter (INDEPENDENT_AMBULATORY_CARE_PROVIDER_SITE_OTHER): Payer: Self-pay

## 2016-02-28 ENCOUNTER — Encounter: Payer: Self-pay | Admitting: Physician Assistant

## 2016-02-28 ENCOUNTER — Ambulatory Visit (INDEPENDENT_AMBULATORY_CARE_PROVIDER_SITE_OTHER): Payer: PPO | Admitting: Physician Assistant

## 2016-02-28 VITALS — BP 134/71 | HR 96 | Temp 102.3°F | Resp 20 | Ht 62.0 in | Wt 185.0 lb

## 2016-02-28 DIAGNOSIS — J209 Acute bronchitis, unspecified: Secondary | ICD-10-CM | POA: Diagnosis not present

## 2016-02-28 DIAGNOSIS — R05 Cough: Secondary | ICD-10-CM

## 2016-02-28 DIAGNOSIS — R059 Cough, unspecified: Secondary | ICD-10-CM

## 2016-02-28 MED ORDER — PREDNISONE 10 MG (21) PO TBPK: ORAL_TABLET | ORAL | 0 refills | Status: DC

## 2016-02-28 MED ORDER — CEFTRIAXONE SODIUM 1 G IJ SOLR
1.0000 g | Freq: Once | INTRAMUSCULAR | Status: AC
  Administered 2016-02-28: 1 g via INTRAMUSCULAR

## 2016-02-28 MED ORDER — AZITHROMYCIN 250 MG PO TABS: ORAL_TABLET | ORAL | 0 refills | Status: DC
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
## 2016-02-28 NOTE — Patient Instructions (Signed)

## 2016-02-28 NOTE — Telephone Encounter (Signed)
Patient was advised she would need to be examined and possibly have a chest x-ray to confirm pnuemonia.  She wants to wait to see if she improves instead of scheduling an office visit.

## 2016-03-16 ENCOUNTER — Ambulatory Visit (INDEPENDENT_AMBULATORY_CARE_PROVIDER_SITE_OTHER): Payer: PPO | Admitting: Physician Assistant

## 2016-03-16 ENCOUNTER — Encounter: Payer: Self-pay | Admitting: Physician Assistant

## 2016-03-16 VITALS — BP 132/78 | HR 101 | Temp 97.7°F | Ht 62.0 in | Wt 185.2 lb

## 2016-03-16 DIAGNOSIS — J209 Acute bronchitis, unspecified: Secondary | ICD-10-CM
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
## 2016-04-06 ENCOUNTER — Telehealth: Payer: Self-pay | Admitting: Nurse Practitioner

## 2016-05-26 ENCOUNTER — Ambulatory Visit (INDEPENDENT_AMBULATORY_CARE_PROVIDER_SITE_OTHER): Payer: PPO | Admitting: Nurse Practitioner

## 2016-05-26 ENCOUNTER — Encounter: Payer: Self-pay | Admitting: Nurse Practitioner

## 2016-05-26 VITALS — BP 138/75 | HR 69 | Temp 97.1°F | Ht 62.0 in | Wt 183.6 lb

## 2016-05-26 DIAGNOSIS — E782 Mixed hyperlipidemia: Secondary | ICD-10-CM

## 2016-05-26 DIAGNOSIS — M8580 Other specified disorders of bone density and structure, unspecified site: Secondary | ICD-10-CM

## 2016-05-26 DIAGNOSIS — I10 Essential (primary) hypertension: Secondary | ICD-10-CM

## 2016-05-26 DIAGNOSIS — K219 Gastro-esophageal reflux disease without esophagitis: Secondary | ICD-10-CM

## 2016-05-26 DIAGNOSIS — Z6833 Body mass index (BMI) 33.0-33.9, adult: Secondary | ICD-10-CM | POA: Diagnosis not present

## 2016-05-26 DIAGNOSIS — J4520 Mild intermittent asthma, uncomplicated: Secondary | ICD-10-CM

## 2016-05-26 DIAGNOSIS — E039 Hypothyroidism, unspecified: Secondary | ICD-10-CM | POA: Diagnosis not present

## 2016-05-26 MED ORDER — BUDESONIDE-FORMOTEROL FUMARATE 160-4.5 MCG/ACT IN AERO: INHALATION_SPRAY | RESPIRATORY_TRACT | 1 refills | Status: DC

## 2016-05-26 MED ORDER — AMLODIPINE BESYLATE 5 MG PO TABS: ORAL_TABLET | ORAL | 1 refills | Status: DC

## 2016-05-26 MED ORDER — OMEPRAZOLE 40 MG PO CPDR: 40.0000 mg | DELAYED_RELEASE_CAPSULE | Freq: Every day | ORAL | 1 refills | Status: DC

## 2016-05-26 MED ORDER — LEVOTHYROXINE SODIUM 75 MCG PO TABS: ORAL_TABLET | ORAL | 1 refills | Status: DC

## 2016-05-26 MED ORDER — ATORVASTATIN CALCIUM 80 MG PO TABS: 80.0000 mg | ORAL_TABLET | Freq: Every day | ORAL | 1 refills | Status: DC

## 2016-05-26 MED ORDER — FENOFIBRATE MICRONIZED 134 MG PO CAPS: ORAL_CAPSULE | ORAL | 1 refills | Status: DC

## 2016-05-26 NOTE — Patient Instructions (Signed)
Fat and Cholesterol Restricted Diet Introduction Getting too much fat and cholesterol in your diet may cause health problems. Following this diet helps keep your fat and cholesterol at normal levels. This can keep you from getting sick. What types of fat should I choose?  Choose monosaturated and polyunsaturated fats. These are found in foods such as olive oil, canola oil, flaxseeds, walnuts, almonds, and seeds.  Eat more omega-3 fats. Good choices include salmon, mackerel, sardines, tuna, flaxseed oil, and ground flaxseeds.  Limit saturated fats. These are in animal products such as meats, butter, and cream. They can also be in plant products such as palm oil, palm kernel oil, and coconut oil.  Avoid foods with partially hydrogenated oils in them. These contain trans fats. Examples of foods that have trans fats are stick margarine, some tub margarines, cookies, crackers, and other baked goods. What general guidelines do I need to follow?  Check food labels. Look for the words "trans fat" and "saturated fat."  When preparing a meal:  Fill half of your plate with vegetables and green salads.  Fill one fourth of your plate with whole grains. Look for the word "whole" as the first word in the ingredient list.  Fill one fourth of your plate with lean protein foods.  Eat more foods that have fiber, like apples, carrots, beans, peas, and barley.  Eat more home-cooked foods. Eat less at restaurants and buffets.  Limit or avoid alcohol.  Limit foods high in starch and sugar.  Limit fried foods.  Cook foods without frying them. Baking, boiling, grilling, and broiling are all great options.  Lose weight if you are overweight. Losing even a small amount of weight can help your overall health. It can also help prevent diseases such as diabetes and heart disease. What foods can I eat? Grains  Whole grains, such as whole wheat or whole grain breads, crackers, cereals, and pasta. Unsweetened  oatmeal, bulgur, barley, quinoa, or brown rice. Corn or whole wheat flour tortillas. Vegetables  Fresh or frozen vegetables (raw, steamed, roasted, or grilled). Green salads. Fruits  All fresh, canned (in natural juice), or frozen fruits. Meat and Other Protein Products  Ground beef (85% or leaner), grass-fed beef, or beef trimmed of fat. Skinless chicken or turkey. Ground chicken or turkey. Pork trimmed of fat. All fish and seafood. Eggs. Dried beans, peas, or lentils. Unsalted nuts or seeds. Unsalted canned or dry beans. Dairy  Low-fat dairy products, such as skim or 1% milk, 2% or reduced-fat cheeses, low-fat ricotta or cottage cheese, or plain low-fat yogurt. Fats and Oils  Tub margarines without trans fats. Light or reduced-fat mayonnaise and salad dressings. Avocado. Olive, canola, sesame, or safflower oils. Natural peanut or almond butter (choose ones without added sugar and oil). The items listed above may not be a complete list of recommended foods or beverages. Contact your dietitian for more options.  What foods are not recommended? Grains  White bread. White pasta. White rice. Cornbread. Bagels, pastries, and croissants. Crackers that contain trans fat. Vegetables  White potatoes. Corn. Creamed or fried vegetables. Vegetables in a cheese sauce. Fruits  Dried fruits. Canned fruit in light or heavy syrup. Fruit juice. Meat and Other Protein Products  Fatty cuts of meat. Ribs, chicken wings, bacon, sausage, bologna, salami, chitterlings, fatback, hot dogs, bratwurst, and packaged luncheon meats. Liver and organ meats. Dairy  Whole or 2% milk, cream, half-and-half, and cream cheese. Whole milk cheeses. Whole-fat or sweetened yogurt. Full-fat cheeses. Nondairy creamers and   whipped toppings. Processed cheese, cheese spreads, or cheese curds. Sweets and Desserts  Corn syrup, sugars, honey, and molasses. Candy. Jam and jelly. Syrup. Sweetened cereals. Cookies, pies, cakes, donuts,  muffins, and ice cream. Fats and Oils  Butter, stick margarine, lard, shortening, ghee, or bacon fat. Coconut, palm kernel, or palm oils. Beverages  Alcohol. Sweetened drinks (such as sodas, lemonade, and fruit drinks or punches). The items listed above may not be a complete list of foods and beverages to avoid. Contact your dietitian for more information.  This information is not intended to replace advice given to you by your health care provider. Make sure you discuss any questions you have with your health care provider. Document Released: 10/20/2011 Document Revised: 12/26/2015 Document Reviewed: 07/20/2013  2017 Elsevier  

## 2016-05-26 NOTE — Progress Notes (Signed)
Subjective:    Patient ID: Karla Maxwell, female    DOB: [birth year], 66 y.o.   MRN: [id number]  Patient here today for follow up of chronic medical problem- no changes since last visit- no complaints. She has had several episodes of bronchitis since last visit , but that seems to have resolved.  Outpatient Encounter Prescriptions as of 05/26/2016  Medication Sig  . amLODipine (NORVASC) 5 MG tablet TAKE ONE TABLET BY MOUTH ONE  TIME DAILY  . aspirin EC 81 MG tablet Take 162 mg by mouth daily.  Marland Kitchen atorvastatin (LIPITOR) 80 MG tablet Take 1 tablet (80 mg total) by mouth daily.  . budesonide-formoterol (SYMBICORT) 160-4.5 MCG/ACT inhaler INHALE 2 PUFFS BY MOUTH INTO  THE LUNGS TWICE A DAY AS INSTRUCTED  . fenofibrate micronized (LOFIBRA) 134 MG capsule TAKE ONE CAPSULE BY MOUTH ONE TIME DAILY BEFORE BREAKFAST  . levothyroxine (SYNTHROID, LEVOTHROID) 75 MCG tablet TAKE 1 TABLET (75 MCG TOTAL)   BY MOUTH DAILY.  . montelukast (SINGULAIR) 10 MG tablet Take 1 tablet (10 mg total) by mouth at bedtime.  Marland Kitchen omeprazole (PRILOSEC) 40 MG capsule Take 1 capsule (40 mg total) by mouth daily.   No facility-administered encounter medications on file as of 05/26/2016.     Hypertension  This is a chronic problem. The current episode started more than 1 year ago. The problem is unchanged. The problem is controlled. Pertinent negatives include no chest pain, headaches, palpitations or shortness of breath. Risk factors for coronary artery disease include dyslipidemia, female gender, post-menopausal state, obesity and sedentary lifestyle. Past treatments include calcium channel blockers. The current treatment provides moderate improvement. Compliance problems include diet and exercise.  Hypertensive end-organ damage includes CAD/MI. Identifiable causes of hypertension include a thyroid problem.  Hyperlipidemia  This is a chronic problem. The current episode started more than 1 year ago. The problem is uncontrolled. Recent  lipid tests were reviewed and are variable. Pertinent negatives include no chest pain, myalgias or shortness of breath. Current antihyperlipidemic treatment includes statins and fibric acid derivatives. The current treatment provides no improvement of lipids. Compliance problems include adherence to diet and adherence to exercise.  Risk factors for coronary artery disease include dyslipidemia, hypertension and post-menopausal.  Gastroesophageal Reflux  She reports no chest pain. Pertinent negatives include no fatigue.  Thyroid Problem  Presents for follow-up (hypothyroidism) visit. Patient reports no diarrhea, fatigue, heat intolerance, palpitations or visual change. Her past medical history is significant for hyperlipidemia.  bronchial asthma Symbicort and singulair daily- Occassional smoker- 3-5 a day. Has not needed albuterol in a few months   Depression screen [facility] 2/9 05/26/2016 03/16/2016 02/24/2016 05/14/2015 12/19/2014  Decreased Interest 0 1 3 0 1  Down, Depressed, Hopeless 0 1 3 0 1  PHQ - 2 Score 0 2 6 0 2  Altered sleeping - 3 3 - 0  Tired, decreased energy - 3 3 - 1  Change in appetite - 3 3 - 0  Feeling bad or failure about yourself  - 1 3 - 0  Trouble concentrating - 3 3 - 0  Moving slowly or fidgety/restless - 1 0 - 0  Suicidal thoughts - 0 0 - 0  PHQ-9 Score - 16 21 - 3  Difficult doing work/chores - - - - Not difficult at all    Review of Systems  Constitutional: Negative for fatigue.  Respiratory: Negative for shortness of breath.   Cardiovascular: Negative for chest pain and palpitations.  Gastrointestinal: Negative for diarrhea.  Endocrine:  Negative for heat intolerance.  Genitourinary: Negative.   Musculoskeletal: Negative for myalgias.  Neurological: Negative for headaches.  Psychiatric/Behavioral: Negative.        Objective:   Physical Exam  Constitutional: She is oriented to person, place, and time. She appears well-developed and well-nourished.  HENT:   Nose: Nose normal.  Mouth/Throat: Oropharynx is clear and moist.  Eyes: EOM are normal.  Neck: Trachea normal, normal range of motion and full passive range of motion without pain. Neck supple. No JVD present. Carotid bruit is not present. No thyromegaly present.  Cardiovascular: Normal rate, regular rhythm, normal heart sounds and intact distal pulses.  Exam reveals no gallop and no friction rub.   No murmur heard. Pulmonary/Chest: Effort normal and breath sounds normal.  Abdominal: Soft. Bowel sounds are normal. She exhibits no distension and no mass. There is no tenderness.  Musculoskeletal: Normal range of motion. She exhibits edema (1+ pitting edema bil).  Lymphadenopathy:    She has no cervical adenopathy.  Neurological: She is alert and oriented to person, place, and time. She has normal reflexes.  Skin: Skin is warm and dry.  Psychiatric: She has a normal mood and affect. Her behavior is normal. Judgment and thought content normal.   BP 138/75   Pulse 69   Temp 97.1 F (36.2 C) (Oral)   Ht '5\' 2"'  (1.575 m)   Wt 183 lb 9.6 oz (83.3 kg)   BMI 33.58 kg/m      Assessment & Plan:  1. Essential hypertension Low sodium diet - CMP14+EGFR - amLODipine (NORVASC) 5 MG tablet; TAKE ONE TABLET BY MOUTH ONE  TIME DAILY  Dispense: 90 tablet; Refill: 1  2. Mild intermittent asthma without complication Avoid cigarette smoke - budesonide-formoterol (SYMBICORT) 160-4.5 MCG/ACT inhaler; INHALE 2 PUFFS BY MOUTH INTO  THE LUNGS TWICE A DAY AS INSTRUCTED  Dispense: 33 g; Refill: 1  3. Gastroesophageal reflux disease, esophagitis presence not specified Avoid spicy foods Do not eat 2 hours prior to bedtime - omeprazole (PRILOSEC) 40 MG capsule; Take 1 capsule (40 mg total) by mouth daily.  Dispense: 90 capsule; Refill: 1  4. Acquired hypothyroidism - Thyroid Panel With TSH - levothyroxine (SYNTHROID, LEVOTHROID) 75 MCG tablet; TAKE 1 TABLET (75 MCG TOTAL)   BY MOUTH DAILY.  Dispense: 90  tablet; Refill: 1  5. Osteopenia, unspecified location Weight bearing exercises  6. BMI 33.0-33.9,adult Discussed diet and exercise for person with BMI >25 Will recheck weight in 3-6 months  7. Mixed hyperlipidemia Low fat diet - Lipid panel - atorvastatin (LIPITOR) 80 MG tablet; Take 1 tablet (80 mg total) by mouth daily.  Dispense: 90 tablet; Refill: 1 - fenofibrate micronized (LOFIBRA) 134 MG capsule; TAKE ONE CAPSULE BY MOUTH ONE TIME DAILY BEFORE BREAKFAST  Dispense: 90 capsule; Refill: 1   Keep appointment for mammogram Labs pending Health maintenance reviewed Diet and exercise encouraged Continue all meds Follow up  In 3 months   Dougherty, FNP

## 2016-05-27 LAB — CMP14+EGFR
A/G RATIO: 2.1 (ref 1.2–2.2)
ALT: 16 IU/L (ref 0–32)
AST: 17 IU/L (ref 0–40)
Albumin: 4.8 g/dL (ref 3.6–4.8)
Alkaline Phosphatase: 54 IU/L (ref 39–117)
BUN/Creatinine Ratio: 15 (ref 12–28)
BUN: 13 mg/dL (ref 8–27)
Bilirubin Total: 0.3 mg/dL (ref 0.0–1.2)
CALCIUM: 10 mg/dL (ref 8.7–10.3)
CO2: 24 mmol/L (ref 18–29)
CREATININE: 0.84 mg/dL (ref 0.57–1.00)
Chloride: 101 mmol/L (ref 96–106)
GFR, EST AFRICAN AMERICAN: 84 mL/min/{1.73_m2} (ref >59)
GFR, EST NON AFRICAN AMERICAN: 73 mL/min/{1.73_m2} (ref >59)
Globulin, Total: 2.3 g/dL (ref 1.5–4.5)
Glucose: 108 mg/dL — ABNORMAL HIGH (ref 65–99)
POTASSIUM: 4.4 mmol/L (ref 3.5–5.2)
Sodium: 142 mmol/L (ref 134–144)
TOTAL PROTEIN: 7.1 g/dL (ref 6.0–8.5)

## 2016-05-27 LAB — LIPID PANEL
CHOL/HDL RATIO: 3.6 ratio (ref 0.0–4.4)
Cholesterol, Total: 146 mg/dL (ref 100–199)
HDL: 41 mg/dL (ref >39)
LDL CALC: 69 mg/dL (ref 0–99)
TRIGLYCERIDES: 178 mg/dL — AB (ref 0–149)
VLDL CHOLESTEROL CAL: 36 mg/dL (ref 5–40)

## 2016-05-27 LAB — THYROID PANEL WITH TSH
FREE THYROXINE INDEX: 2.7 (ref 1.2–4.9)
T3 Uptake Ratio: 25 % (ref 24–39)
T4, Total: 10.6 ug/dL (ref 4.5–12.0)
TSH: 0.998 u[IU]/mL (ref 0.450–4.500)

## 2016-07-01 ENCOUNTER — Encounter: Payer: PPO | Admitting: *Deleted

## 2016-07-13 ENCOUNTER — Encounter: Payer: PPO | Admitting: *Deleted

## 2016-07-23 DIAGNOSIS — Z1231 Encounter for screening mammogram for malignant neoplasm of breast: Secondary | ICD-10-CM | POA: Diagnosis not present

## 2016-08-26 ENCOUNTER — Encounter: Payer: Self-pay | Admitting: Nurse Practitioner

## 2016-08-26 ENCOUNTER — Ambulatory Visit (INDEPENDENT_AMBULATORY_CARE_PROVIDER_SITE_OTHER): Payer: PPO | Admitting: Nurse Practitioner

## 2016-08-26 VITALS — BP 136/83 | HR 69 | Temp 97.3°F | Ht 62.0 in | Wt 184.0 lb

## 2016-08-26 DIAGNOSIS — E039 Hypothyroidism, unspecified: Secondary | ICD-10-CM

## 2016-08-26 DIAGNOSIS — E782 Mixed hyperlipidemia: Secondary | ICD-10-CM

## 2016-08-26 DIAGNOSIS — M8580 Other specified disorders of bone density and structure, unspecified site: Secondary | ICD-10-CM

## 2016-08-26 DIAGNOSIS — F330 Major depressive disorder, recurrent, mild: Secondary | ICD-10-CM | POA: Diagnosis not present

## 2016-08-26 DIAGNOSIS — Z6833 Body mass index (BMI) 33.0-33.9, adult: Secondary | ICD-10-CM | POA: Diagnosis not present

## 2016-08-26 DIAGNOSIS — K219 Gastro-esophageal reflux disease without esophagitis: Secondary | ICD-10-CM

## 2016-08-26 DIAGNOSIS — J4520 Mild intermittent asthma, uncomplicated: Secondary | ICD-10-CM | POA: Diagnosis not present

## 2016-08-26 DIAGNOSIS — I10 Essential (primary) hypertension: Secondary | ICD-10-CM

## 2016-08-26 DIAGNOSIS — Z1212 Encounter for screening for malignant neoplasm of rectum: Secondary | ICD-10-CM

## 2016-08-26 DIAGNOSIS — R739 Hyperglycemia, unspecified: Secondary | ICD-10-CM

## 2016-08-26 MED ORDER — ATORVASTATIN CALCIUM 80 MG PO TABS: 80.0000 mg | ORAL_TABLET | Freq: Every day | ORAL | 1 refills | Status: DC

## 2016-08-26 MED ORDER — LEVOTHYROXINE SODIUM 75 MCG PO TABS: ORAL_TABLET | ORAL | 1 refills | Status: DC

## 2016-08-26 MED ORDER — AMLODIPINE BESYLATE 5 MG PO TABS: ORAL_TABLET | ORAL | 1 refills | Status: DC

## 2016-08-26 MED ORDER — FENOFIBRATE MICRONIZED 134 MG PO CAPS: ORAL_CAPSULE | ORAL | 1 refills | Status: DC

## 2016-08-26 MED ORDER — BUDESONIDE-FORMOTEROL FUMARATE 160-4.5 MCG/ACT IN AERO: INHALATION_SPRAY | RESPIRATORY_TRACT | 1 refills | Status: DC

## 2016-08-26 MED ORDER — OMEPRAZOLE 40 MG PO CPDR: 40.0000 mg | DELAYED_RELEASE_CAPSULE | Freq: Every day | ORAL | 1 refills | Status: DC

## 2016-08-26 NOTE — Patient Instructions (Signed)
Health Maintenance, Female Adopting a healthy lifestyle and getting preventive care can go a long way to promote health and wellness. Talk with your health care provider about what schedule of regular examinations is right for you. This is a good chance for you to check in with your provider about disease prevention and staying healthy. In between checkups, there are plenty of things you can do on your own. Experts have done a lot of research about which lifestyle changes and preventive measures are most likely to keep you healthy. Ask your health care provider for more information. Weight and diet Eat a healthy diet  Be sure to include plenty of vegetables, fruits, low-fat dairy products, and lean protein.  Do not eat a lot of foods high in solid fats, added sugars, or salt.  Get regular exercise. This is one of the most important things you can do for your health.  Most adults should exercise for at least 150 minutes each week. The exercise should increase your heart rate and make you sweat (moderate-intensity exercise).  Most adults should also do strengthening exercises at least twice a week. This is in addition to the moderate-intensity exercise. Maintain a healthy weight  Body mass index (BMI) is a measurement that can be used to identify possible weight problems. It estimates body fat based on height and weight. Your health care provider can help determine your BMI and help you achieve or maintain a healthy weight.  For females 66 years of age and older:  A BMI below 18.5 is considered underweight.  A BMI of 18.5 to 24.9 is normal.  A BMI of 25 to 29.9 is considered overweight.  A BMI of 30 and above is considered obese. Watch levels of cholesterol and blood lipids  You should start having your blood tested for lipids and cholesterol at 66 years of age, then have this test every 5 years.  You may need to have your cholesterol levels checked more often if:  Your lipid or  cholesterol levels are high.  You are older than 66 years of age.  You are at high risk for heart disease. Cancer screening Lung Cancer  Lung cancer screening is recommended for adults 66-42 years old who are at high risk for lung cancer because of a history of smoking.  A yearly low-dose CT scan of the lungs is recommended for people who:  Currently smoke.  Have quit within the past 15 years.  Have at least a 30-pack-year history of smoking. A pack year is smoking an average of one pack of cigarettes a day for 1 year.  Yearly screening should continue until it has been 15 years since you quit.  Yearly screening should stop if you develop a health problem that would prevent you from having lung cancer treatment. Breast Cancer  Practice breast self-awareness. This means understanding how your breasts normally appear and feel.  It also means doing regular breast self-exams. Let your health care provider know about any changes, no matter how small.  If you are in your 20s or 30s, you should have a clinical breast exam (CBE) by a health care provider every 1-3 years as part of a regular health exam.  If you are 66 or older, have a CBE every year. Also consider having a breast X-ray (mammogram) every year.  If you have a family history of breast cancer, talk to your health care provider about genetic screening.  If you are at high risk for breast cancer, talk  to your health care provider about having an MRI and a mammogram every year.  Breast cancer gene (BRCA) assessment is recommended for women who have family members with BRCA-related cancers. BRCA-related cancers include:  Breast.  Ovarian.  Tubal.  Peritoneal cancers.  Results of the assessment will determine the need for genetic counseling and BRCA1 and BRCA2 testing. Cervical Cancer  Your health care provider may recommend that you be screened regularly for cancer of the pelvic organs (ovaries, uterus, and vagina).  This screening involves a pelvic examination, including checking for microscopic changes to the surface of your cervix (Pap test). You may be encouraged to have this screening done every 3 years, beginning at age 31.  For women ages 66-65, health care providers may recommend pelvic exams and Pap testing every 3 years, or they may recommend the Pap and pelvic exam, combined with testing for human papilloma virus (HPV), every 5 years. Some types of HPV increase your risk of cervical cancer. Testing for HPV may also be done on women of any age with unclear Pap test results.  Other health care providers may not recommend any screening for nonpregnant women who are considered low risk for pelvic cancer and who do not have symptoms. Ask your health care provider if a screening pelvic exam is right for you.  If you have had past treatment for cervical cancer or a condition that could lead to cancer, you need Pap tests and screening for cancer for at least 20 years after your treatment. If Pap tests have been discontinued, your risk factors (such as having a new sexual partner) need to be reassessed to determine if screening should resume. Some women have medical problems that increase the chance of getting cervical cancer. In these cases, your health care provider may recommend more frequent screening and Pap tests. Colorectal Cancer  This type of cancer can be detected and often prevented.  Routine colorectal cancer screening usually begins at 66 years of age and continues through 66 years of age.  Your health care provider may recommend screening at an earlier age if you have risk factors for colon cancer.  Your health care provider may also recommend using home test kits to check for hidden blood in the stool.  A small camera at the end of a tube can be used to examine your colon directly (sigmoidoscopy or colonoscopy). This is done to check for the earliest forms of colorectal cancer.  Routine  screening usually begins at age 66.  Direct examination of the colon should be repeated every 5-10 years through 66 years of age. However, you may need to be screened more often if early forms of precancerous polyps or small growths are found. Skin Cancer  Check your skin from head to toe regularly.  Tell your health care provider about any new moles or changes in moles, especially if there is a change in a mole's shape or color.  Also tell your health care provider if you have a mole that is larger than the size of a pencil eraser.  Always use sunscreen. Apply sunscreen liberally and repeatedly throughout the day.  Protect yourself by wearing long sleeves, pants, a wide-brimmed hat, and sunglasses whenever you are outside. Heart disease, diabetes, and high blood pressure  High blood pressure causes heart disease and increases the risk of stroke. High blood pressure is more likely to develop in:  People who have blood pressure in the high end of the normal range (130-139/85-89 mm Hg).  People who are overweight or obese.  People who are African American.  If you are 61-48 years of age, have your blood pressure checked every 3-5 years. If you are 20 years of age or older, have your blood pressure checked every year. You should have your blood pressure measured twice-once when you are at a hospital or clinic, and once when you are not at a hospital or clinic. Record the average of the two measurements. To check your blood pressure when you are not at a hospital or clinic, you can use:  An automated blood pressure machine at a pharmacy.  A home blood pressure monitor.  If you are between 91 years and 46 years old, ask your health care provider if you should take aspirin to prevent strokes.  Have regular diabetes screenings. This involves taking a blood sample to check your fasting blood sugar level.  If you are at a normal weight and have a low risk for diabetes, have this test once  every three years after 66 years of age.  If you are overweight and have a high risk for diabetes, consider being tested at a younger age or more often. Preventing infection Hepatitis B  If you have a higher risk for hepatitis B, you should be screened for this virus. You are considered at high risk for hepatitis B if:  You were born in a country where hepatitis B is common. Ask your health care provider which countries are considered high risk.  Your parents were born in a high-risk country, and you have not been immunized against hepatitis B (hepatitis B vaccine).  You have HIV or AIDS.  You use needles to inject street drugs.  You live with someone who has hepatitis B.  You have had sex with someone who has hepatitis B.  You get hemodialysis treatment.  You take certain medicines for conditions, including cancer, organ transplantation, and autoimmune conditions. Hepatitis C  Blood testing is recommended for:  Everyone born from [birth year] through 1965.  Anyone with known risk factors for hepatitis C. Sexually transmitted infections (STIs)  You should be screened for sexually transmitted infections (STIs) including gonorrhea and chlamydia if:  You are sexually active and are younger than 66 years of age.  You are older than 66 years of age and your health care provider tells you that you are at risk for this type of infection.  Your sexual activity has changed since you were last screened and you are at an increased risk for chlamydia or gonorrhea. Ask your health care provider if you are at risk.  If you do not have HIV, but are at risk, it may be recommended that you take a prescription medicine daily to prevent HIV infection. This is called pre-exposure prophylaxis (PrEP). You are considered at risk if:  You are sexually active and do not regularly use condoms or know the HIV status of your partner(s).  You take drugs by injection.  You are sexually active with a partner  who has HIV. Talk with your health care provider about whether you are at high risk of being infected with HIV. If you choose to begin PrEP, you should first be tested for HIV. You should then be tested every 3 months for as long as you are taking PrEP. Pregnancy  If you are premenopausal and you may become pregnant, ask your health care provider about preconception counseling.  If you may become pregnant, take 400 to 800 micrograms (mcg) of folic acid  every day.  If you want to prevent pregnancy, talk to your health care provider about birth control (contraception). Osteoporosis and menopause  Osteoporosis is a disease in which the bones lose minerals and strength with aging. This can result in serious bone fractures. Your risk for osteoporosis can be identified using a bone density scan.  If you are 4 years of age or older, or if you are at risk for osteoporosis and fractures, ask your health care provider if you should be screened.  Ask your health care provider whether you should take a calcium or vitamin D supplement to lower your risk for osteoporosis.  Menopause may have certain physical symptoms and risks.  Hormone replacement therapy may reduce some of these symptoms and risks. Talk to your health care provider about whether hormone replacement therapy is right for you. Follow these instructions at home:  Schedule regular health, dental, and eye exams.  Stay current with your immunizations.  Do not use any tobacco products including cigarettes, chewing tobacco, or electronic cigarettes.  If you are pregnant, do not drink alcohol.  If you are breastfeeding, limit how much and how often you drink alcohol.  Limit alcohol intake to no more than 1 drink per day for nonpregnant women. One drink equals 12 ounces of beer, 5 ounces of wine, or 1 ounces of hard liquor.  Do not use street drugs.  Do not share needles.  Ask your health care provider for help if you need support  or information about quitting drugs.  Tell your health care provider if you often feel depressed.  Tell your health care provider if you have ever been abused or do not feel safe at home. This information is not intended to replace advice given to you by your health care provider. Make sure you discuss any questions you have with your health care provider. Document Released: 11/03/2010 Document Revised: 09/26/2015 Document Reviewed: 01/22/2015 Elsevier Interactive Patient Education  2017 [employer].

## 2016-08-26 NOTE — Progress Notes (Signed)
Subjective:    Patient ID: Karla Maxwell, female    DOB: December 08, 1950, 66 y.o.   MRN: [id number]  HPI  Karla Maxwell is here today for follow up of chronic medical problem.  Outpatient Encounter Prescriptions as of 08/26/2016  Medication Sig  . amLODipine (NORVASC) 5 MG tablet TAKE ONE TABLET BY MOUTH ONE  TIME DAILY  . aspirin EC 81 MG tablet Take 162 mg by mouth daily.  Marland Kitchen atorvastatin (LIPITOR) 80 MG tablet Take 1 tablet (80 mg total) by mouth daily.  . budesonide-formoterol (SYMBICORT) 160-4.5 MCG/ACT inhaler INHALE 2 PUFFS BY MOUTH INTO  THE LUNGS TWICE A DAY AS INSTRUCTED  . fenofibrate micronized (LOFIBRA) 134 MG capsule TAKE ONE CAPSULE BY MOUTH ONE TIME DAILY BEFORE BREAKFAST  . levothyroxine (SYNTHROID, LEVOTHROID) 75 MCG tablet TAKE 1 TABLET (75 MCG TOTAL)   BY MOUTH DAILY.  . montelukast (SINGULAIR) 10 MG tablet Take 1 tablet (10 mg total) by mouth at bedtime.  Marland Kitchen omeprazole (PRILOSEC) 40 MG capsule Take 1 capsule (40 mg total) by mouth daily.   No facility-administered encounter medications on file as of 08/26/2016.     1. Essential hypertension  No c/o chest pain ,SOB or headache. Does nt check blood pressure at home  2. Mild intermittent asthma without complication  Uses symbicort  3. Gastroesophageal reflux disease, esophagitis presence not specified  Currently on omeprazole- no recent symptom flare up  4. Acquired hypothyroidism  No problems  5. Osteopenia, unspecified location  NO c/o back pain  6. BMI 33.0-33.9,adult  No recent weight gain or weight loss  7. Mixed hyperlipidemia  Low fat diet  8. Mild episode of recurrent major depressive disorder (HCC)  Currently not taking anything    New complaints: None today     Review of Systems  Constitutional: Negative for diaphoresis.  Eyes: Negative for pain.  Respiratory: Negative for shortness of breath.   Cardiovascular: Negative for chest pain, palpitations and leg swelling.  Gastrointestinal: Negative  for abdominal pain.  Endocrine: Negative for polydipsia.  Skin: Negative for rash.  Neurological: Negative for dizziness, weakness and headaches.  Hematological: Does not bruise/bleed easily.       Objective:   Physical Exam  Constitutional: She is oriented to person, place, and time. She appears well-developed and well-nourished.  HENT:  Nose: Nose normal.  Mouth/Throat: Oropharynx is clear and moist.  Eyes: EOM are normal.  Neck: Trachea normal, normal range of motion and full passive range of motion without pain. Neck supple. No JVD present. Carotid bruit is not present. No thyromegaly present.  Cardiovascular: Normal rate, regular rhythm, normal heart sounds and intact distal pulses.  Exam reveals no gallop and no friction rub.   No murmur heard. Pulmonary/Chest: Effort normal and breath sounds normal.  Abdominal: Soft. Bowel sounds are normal. She exhibits no distension and no mass. There is no tenderness.  Musculoskeletal: Normal range of motion.  No right arm from just below shoulder  Lymphadenopathy:    She has no cervical adenopathy.  Neurological: She is alert and oriented to person, place, and time. She has normal reflexes.  Skin: Skin is warm and dry.  Psychiatric: She has a normal mood and affect. Her behavior is normal. Judgment and thought content normal.   BP 136/83   Pulse 69   Temp 97.3 F (36.3 C) (Oral)   Ht _0  (1.575 m)   Wt 184 lb (83.5 kg)   BMI 33.65 kg/m  Assessment & Plan:  1. Essential hypertension Low sodium diet - CMP14+EGFR - amLODipine (NORVASC) 5 MG tablet; TAKE ONE TABLET BY MOUTH ONE  TIME DAILY  Dispense: 90 tablet; Refill: 1  2. Mild intermittent asthma without complication Doing well - budesonide-formoterol (SYMBICORT) 160-4.5 MCG/ACT inhaler; INHALE 2 PUFFS BY MOUTH INTO  THE LUNGS TWICE A DAY AS INSTRUCTED  Dispense: 33 g; Refill: 1  3. Gastroesophageal reflux disease, esophagitis presence not specified Avoid spicy  foods Do not eat 2 hours prior to bedtime - omeprazole (PRILOSEC) 40 MG capsule; Take 1 capsule (40 mg total) by mouth daily.  Dispense: 90 capsule; Refill: 1  4. Acquired hypothyroidism - levothyroxine (SYNTHROID, LEVOTHROID) 75 MCG tablet; TAKE 1 TABLET (75 MCG TOTAL)   BY MOUTH DAILY.  Dispense: 90 tablet; Refill: 1  5. Osteopenia, unspecified location Weight bearing exercise encouraged  6. BMI 33.0-33.9,adult Discussed diet and exercise for person with BMI >25 Will recheck weight in 3-6 months  7. Mixed hyperlipidemia Low fat diet - Lipid panel - atorvastatin (LIPITOR) 80 MG tablet; Take 1 tablet (80 mg total) by mouth daily.  Dispense: 90 tablet; Refill: 1 - fenofibrate micronized (LOFIBRA) 134 MG capsule; TAKE ONE CAPSULE BY MOUTH ONE TIME DAILY BEFORE BREAKFAST  Dispense: 90 capsule; Refill: 1  8. Mild episode of recurrent major depressive disorder [facility]) Doing well- does better in spring and summer when she can et outside  9. Screening for malignant neoplasm of the rectum - Fecal occult blood, imunochemical; Future    Labs pending Health maintenance reviewed Diet and exercise encouraged Continue all meds Follow up  In 6 months   [place], FNP

## 2016-08-27 ENCOUNTER — Other Ambulatory Visit: Payer: Self-pay | Admitting: Nurse Practitioner

## 2016-08-27 ENCOUNTER — Other Ambulatory Visit: Payer: PPO

## 2016-08-27 DIAGNOSIS — Z1212 Encounter for screening for malignant neoplasm of rectum: Secondary | ICD-10-CM | POA: Diagnosis not present

## 2016-08-27 LAB — CMP14+EGFR
A/G RATIO: 1.7 (ref 1.2–2.2)
ALK PHOS: 63 IU/L (ref 39–117)
ALT: 23 IU/L (ref 0–32)
AST: 26 IU/L (ref 0–40)
Albumin: 4.7 g/dL (ref 3.6–4.8)
BILIRUBIN TOTAL: 0.3 mg/dL (ref 0.0–1.2)
BUN / CREAT RATIO: 12 (ref 12–28)
BUN: 10 mg/dL (ref 8–27)
CHLORIDE: 105 mmol/L (ref 96–106)
CO2: 17 mmol/L — ABNORMAL LOW (ref 18–29)
Calcium: 10.2 mg/dL (ref 8.7–10.3)
Creatinine, Ser: 0.85 mg/dL (ref 0.57–1.00)
GFR calc Af Amer: 83 mL/min/{1.73_m2} (ref >59)
GFR calc non Af Amer: 72 mL/min/{1.73_m2} (ref >59)
GLOBULIN, TOTAL: 2.8 g/dL (ref 1.5–4.5)
Glucose: 112 mg/dL — ABNORMAL HIGH (ref 65–99)
Potassium: 4.8 mmol/L (ref 3.5–5.2)
SODIUM: 145 mmol/L — AB (ref 134–144)
Total Protein: 7.5 g/dL (ref 6.0–8.5)

## 2016-08-27 LAB — LIPID PANEL
CHOLESTEROL TOTAL: 138 mg/dL (ref 100–199)
Chol/HDL Ratio: 3.3 ratio (ref 0.0–4.4)
HDL: 42 mg/dL (ref >39)
LDL Calculated: 58 mg/dL (ref 0–99)
Triglycerides: 191 mg/dL — ABNORMAL HIGH (ref 0–149)
VLDL Cholesterol Cal: 38 mg/dL (ref 5–40)

## 2016-08-28 LAB — FECAL OCCULT BLOOD, IMMUNOCHEMICAL: Fecal Occult Bld: NEGATIVE

## 2016-09-01 ENCOUNTER — Other Ambulatory Visit: Payer: PPO

## 2016-09-01 DIAGNOSIS — R739 Hyperglycemia, unspecified: Secondary | ICD-10-CM | POA: Diagnosis not present

## 2016-09-01 LAB — BAYER DCA HB A1C WAIVED: HB A1C (BAYER DCA - WAIVED): 5.8 % (ref <7.0)

## 2016-09-01 NOTE — Addendum Note (Signed)
Addended by: Rolena Infante on: 09/01/2016 03:35 PM   Modules accepted: Orders

## 2017-02-25 ENCOUNTER — Encounter: Payer: Self-pay | Admitting: Nurse Practitioner

## 2017-02-25 ENCOUNTER — Ambulatory Visit (INDEPENDENT_AMBULATORY_CARE_PROVIDER_SITE_OTHER): Payer: PPO | Admitting: Nurse Practitioner

## 2017-02-25 ENCOUNTER — Ambulatory Visit (INDEPENDENT_AMBULATORY_CARE_PROVIDER_SITE_OTHER): Payer: PPO

## 2017-02-25 VITALS — BP 146/78 | HR 80 | Temp 96.8°F | Ht 62.0 in | Wt 180.0 lb

## 2017-02-25 DIAGNOSIS — E039 Hypothyroidism, unspecified: Secondary | ICD-10-CM

## 2017-02-25 DIAGNOSIS — J4520 Mild intermittent asthma, uncomplicated: Secondary | ICD-10-CM

## 2017-02-25 DIAGNOSIS — F330 Major depressive disorder, recurrent, mild: Secondary | ICD-10-CM | POA: Diagnosis not present

## 2017-02-25 DIAGNOSIS — M8580 Other specified disorders of bone density and structure, unspecified site: Secondary | ICD-10-CM

## 2017-02-25 DIAGNOSIS — Z23 Encounter for immunization: Secondary | ICD-10-CM | POA: Diagnosis not present

## 2017-02-25 DIAGNOSIS — K219 Gastro-esophageal reflux disease without esophagitis: Secondary | ICD-10-CM

## 2017-02-25 DIAGNOSIS — I10 Essential (primary) hypertension: Secondary | ICD-10-CM

## 2017-02-25 DIAGNOSIS — R0789 Other chest pain: Secondary | ICD-10-CM | POA: Diagnosis not present

## 2017-02-25 DIAGNOSIS — Z6833 Body mass index (BMI) 33.0-33.9, adult: Secondary | ICD-10-CM

## 2017-02-25 DIAGNOSIS — E782 Mixed hyperlipidemia: Secondary | ICD-10-CM

## 2017-02-25 MED ORDER — MONTELUKAST SODIUM 10 MG PO TABS: 10.0000 mg | ORAL_TABLET | Freq: Every day | ORAL | 1 refills | Status: DC

## 2017-02-25 MED ORDER — FENOFIBRATE MICRONIZED 134 MG PO CAPS: ORAL_CAPSULE | ORAL | 1 refills | Status: DC

## 2017-02-25 MED ORDER — BUDESONIDE-FORMOTEROL FUMARATE 160-4.5 MCG/ACT IN AERO: INHALATION_SPRAY | RESPIRATORY_TRACT | 1 refills | Status: DC

## 2017-02-25 MED ORDER — ATORVASTATIN CALCIUM 80 MG PO TABS: 80.0000 mg | ORAL_TABLET | Freq: Every day | ORAL | 1 refills | Status: DC

## 2017-02-25 MED ORDER — AMLODIPINE BESYLATE 5 MG PO TABS: ORAL_TABLET | ORAL | 1 refills | Status: DC

## 2017-02-25 MED ORDER — OMEPRAZOLE 40 MG PO CPDR: 40.0000 mg | DELAYED_RELEASE_CAPSULE | Freq: Every day | ORAL | 1 refills | Status: DC

## 2017-02-25 MED ORDER — LEVOTHYROXINE SODIUM 75 MCG PO TABS: ORAL_TABLET | ORAL | 1 refills | Status: DC

## 2017-02-25 NOTE — Progress Notes (Signed)
Subjective:    Patient ID: Karla Maxwell, female    DOB: 01-05-1951, 66 y.o.   MRN: [id number]  HPI Karla Maxwell is here today for follow up of chronic medical problem.  Outpatient Encounter Prescriptions as of 02/25/2017  Medication Sig  . amLODipine (NORVASC) 5 MG tablet TAKE ONE TABLET BY MOUTH ONE  TIME DAILY  . aspirin EC 81 MG tablet Take 162 mg by mouth daily.  Marland Kitchen atorvastatin (LIPITOR) 80 MG tablet Take 1 tablet (80 mg total) by mouth daily.  . fenofibrate micronized (LOFIBRA) 134 MG capsule TAKE ONE CAPSULE BY MOUTH ONE TIME DAILY BEFORE BREAKFAST  . levothyroxine (SYNTHROID, LEVOTHROID) 75 MCG tablet TAKE 1 TABLET (75 MCG TOTAL)   BY MOUTH DAILY.  . montelukast (SINGULAIR) 10 MG tablet TAKE ONE TABLET BY MOUTH ONCE DAILY AT BEDTIME  . omeprazole (PRILOSEC) 40 MG capsule Take 1 capsule (40 mg total) by mouth daily.  . budesonide-formoterol (SYMBICORT) 160-4.5 MCG/ACT inhaler INHALE 2 PUFFS BY MOUTH INTO  THE LUNGS TWICE A DAY AS INSTRUCTED (Patient not taking: Reported on 02/25/2017)   No facility-administered encounter medications on file as of 02/25/2017.     1. Essential hypertension  Blood pressure controlled with amlodipine.  Patient does check BP at home and it is typically 130s/80s.  Elevated in the office today, but she states she's been under increased stress lately.  2. Gastroesophageal reflux disease, esophagitis presence not specified  Symptoms controlled with omeprazole.  No concerns.  3. Acquired hypothyroidism  Managed with levothyroxine.  Labs monitored regularly.  4. Osteopenia, unspecified location  Not currently taking supplement.  Will continue to monitor.  5. Mixed hyperlipidemia  Taking fenofibrate and atorvastatin.  LFTs monitored regularly.  6. BMI 33.0-33.9,adult  No significant weight gain or loss.  7. Mild episode of recurrent major depressive disorder ([place])  Managed with stress reduction.  No medication at this time.  8.      Bronchial  asthma          Controlled with Singulair and Symbicort daily.  Patient does not               smoke. Syptoms well-controlled currently.  Patient states she had          to flu at the end of  September and since then she has not been             using her Symbicort because it has  been making her SOB and          chest feel tight.  New complaints: Lots of stressful life events recently.  Social history: Lives with her husband- is retired   Review of Systems  Constitutional: Positive for appetite change (unable to tolerate much since the flu in Sept). Negative for activity change and fever.  HENT: Positive for sinus pressure.   Respiratory: Positive for cough (using Mucinex). Negative for shortness of breath.   Cardiovascular: Negative for chest pain and palpitations.  Neurological: Negative for dizziness, light-headedness and headaches.  All other systems reviewed and are negative.      Objective:   Physical Exam  Constitutional: She is oriented to person, place, and time. She appears well-developed and well-nourished. No distress.  HENT:  Head: Normocephalic.  Right Ear: External ear normal.  Left Ear: External ear normal.  Mouth/Throat: Oropharynx is clear and moist.  Eyes: Pupils are equal, round, and reactive to light.  Neck: Normal range of motion. Neck supple. No thyromegaly present.  Cardiovascular: Normal rate, regular rhythm, normal heart sounds and intact distal pulses.   No murmur heard. Pulmonary/Chest: Effort normal and breath sounds normal. No respiratory distress. She has no wheezes.  Abdominal: Soft. Bowel sounds are normal. She exhibits no distension. There is no tenderness.  Musculoskeletal: Normal range of motion. She exhibits no edema.  Lymphadenopathy:    She has no cervical adenopathy.  Neurological: She is alert and oriented to person, place, and time.  Skin: Skin is warm and dry.  Psychiatric: She has a normal mood and affect. Her behavior is normal.    BP (!) 146/78 (BP Location: Left Arm, Cuff Size: Normal)   Pulse 80   Temp (!) 96.8 F (36 C) (Oral)   Ht _0  (1.575 m)   Wt 180 lb (81.6 kg)   BMI 32.92 kg/m   EKG- NSR-Mary-Margaret Hassell Done, FNP  Chest x ray- chronic bronchitic changes-Preliminary reading by Ronnald Collum, FNP  [facility]      Assessment & Plan:  1. Essential hypertension Low sodium diet - amLODipine (NORVASC) 5 MG tablet; TAKE ONE TABLET BY MOUTH ONE  TIME DAILY  Dispense: 90 tablet; Refill: 1 - CMP14+EGFR  2. Gastroesophageal reflux disease, esophagitis presence not specified Avoid spicy foods Do not eat 2 hours prior to bedtime - omeprazole (PRILOSEC) 40 MG capsule; Take 1 capsule (40 mg total) by mouth daily.  Dispense: 90 capsule; Refill: 1  3. Acquired hypothyroidism - levothyroxine (SYNTHROID, LEVOTHROID) 75 MCG tablet; TAKE 1 TABLET (75 MCG TOTAL)   BY MOUTH DAILY.  Dispense: 90 tablet; Refill: 1  4. Osteopenia, unspecified location Weight bearing exercises encouraged  5. Mixed hyperlipidemia Low fat diet - atorvastatin (LIPITOR) 80 MG tablet; Take 1 tablet (80 mg total) by mouth daily.  Dispense: 90 tablet; Refill: 1 - fenofibrate micronized (LOFIBRA) 134 MG capsule; TAKE ONE CAPSULE BY MOUTH ONE TIME DAILY BEFORE BREAKFAST  Dispense: 90 capsule; Refill: 1 - Lipid panel  6. BMI 33.0-33.9,adult Discussed diet and exercise for person with BMI >25 Will recheck weight in 3-6 months  7. Mild episode of recurrent major depressive disorder [facility]) Stress management encouraged  8. Mild intermittent asthma without complication - budesonide-formoterol (SYMBICORT) 160-4.5 MCG/ACT inhaler; INHALE 2 PUFFS BY MOUTH INTO  THE LUNGS TWICE A DAY AS INSTRUCTED  Dispense: 33 g; Refill: 1 - montelukast (SINGULAIR) 10 MG tablet; Take 1 tablet (10 mg total) by mouth daily with breakfast.  Dispense: 90 tablet; Refill: 1  9. Chest tightness If continues into next week would like for patient to follow up with  cardiology - EKG 12-Lead    Labs pending Health maintenance reviewed Diet and exercise encouraged Continue all meds Follow up  In 3 months   [place], FNP

## 2017-02-26 LAB — CMP14+EGFR
A/G RATIO: 1.6 (ref 1.2–2.2)
ALBUMIN: 4.4 g/dL (ref 3.6–4.8)
ALK PHOS: 59 IU/L (ref 39–117)
ALT: 17 IU/L (ref 0–32)
AST: 20 IU/L (ref 0–40)
BILIRUBIN TOTAL: 0.3 mg/dL (ref 0.0–1.2)
BUN / CREAT RATIO: 10 — AB (ref 12–28)
BUN: 8 mg/dL (ref 8–27)
CHLORIDE: 103 mmol/L (ref 96–106)
CO2: 23 mmol/L (ref 20–29)
Calcium: 10.1 mg/dL (ref 8.7–10.3)
Creatinine, Ser: 0.77 mg/dL (ref 0.57–1.00)
GFR calc Af Amer: 93 mL/min/{1.73_m2} (ref >59)
GFR calc non Af Amer: 81 mL/min/{1.73_m2} (ref >59)
GLOBULIN, TOTAL: 2.7 g/dL (ref 1.5–4.5)
Glucose: 109 mg/dL — ABNORMAL HIGH (ref 65–99)
POTASSIUM: 4.9 mmol/L (ref 3.5–5.2)
SODIUM: 143 mmol/L (ref 134–144)
Total Protein: 7.1 g/dL (ref 6.0–8.5)

## 2017-02-26 LAB — LIPID PANEL
CHOLESTEROL TOTAL: 117 mg/dL (ref 100–199)
Chol/HDL Ratio: 3 ratio (ref 0.0–4.4)
HDL: 39 mg/dL — AB (ref >39)
LDL CALC: 47 mg/dL (ref 0–99)
Triglycerides: 156 mg/dL — ABNORMAL HIGH (ref 0–149)
VLDL CHOLESTEROL CAL: 31 mg/dL (ref 5–40)

## 2017-03-08 ENCOUNTER — Telehealth: Payer: Self-pay | Admitting: Nurse Practitioner

## 2017-03-08 NOTE — Telephone Encounter (Signed)
Patient aware of recommendation. Patient will increase amlodipine and let us know how her bp are running.

## 2017-03-08 NOTE — Telephone Encounter (Signed)
Patient states that her BP has not come down. She states that it is running around 148/95, 154/100, 151/81, 141/95. Patient states you were discussing referring her to cardiology?

## 2017-03-08 NOTE — Telephone Encounter (Signed)
We were going to do cardiology referral if kept having chest tightness- cardiology will not do anything about blood pressure. If blood pressure is running high double up on amlodipine and let me know if helps so i can send in new rx.

## 2017-05-26 ENCOUNTER — Telehealth: Payer: Self-pay | Admitting: Nurse Practitioner

## 2017-05-26 DIAGNOSIS — I10 Essential (primary) hypertension: Secondary | ICD-10-CM

## 2017-06-03 MED ORDER — AMLODIPINE BESYLATE 5 MG PO TABS: ORAL_TABLET | ORAL | 1 refills | Status: DC

## 2017-06-03 NOTE — Telephone Encounter (Signed)
rf fixed for pt

## 2017-06-08 ENCOUNTER — Encounter: Payer: Self-pay | Admitting: Nurse Practitioner

## 2017-06-08 ENCOUNTER — Ambulatory Visit (INDEPENDENT_AMBULATORY_CARE_PROVIDER_SITE_OTHER): Payer: Medicare HMO | Admitting: Nurse Practitioner

## 2017-06-08 VITALS — BP 144/75 | HR 80 | Temp 97.4°F | Ht 62.0 in | Wt 183.0 lb

## 2017-06-08 DIAGNOSIS — M5432 Sciatica, left side: Secondary | ICD-10-CM | POA: Diagnosis not present

## 2017-06-08 DIAGNOSIS — J209 Acute bronchitis, unspecified: Secondary | ICD-10-CM

## 2017-06-08 DIAGNOSIS — J440 Chronic obstructive pulmonary disease with acute lower respiratory infection: Secondary | ICD-10-CM | POA: Diagnosis not present

## 2017-06-08 DIAGNOSIS — R52 Pain, unspecified: Secondary | ICD-10-CM | POA: Diagnosis not present

## 2017-06-08 LAB — VERITOR FLU A/B WAIVED
INFLUENZA A: NEGATIVE
INFLUENZA B: NEGATIVE

## 2017-06-08 MED ORDER — METHYLPREDNISOLONE ACETATE 80 MG/ML IJ SUSP
80.0000 mg | Freq: Once | INTRAMUSCULAR | Status: AC
  Administered 2017-06-08: 80 mg via INTRAMUSCULAR

## 2017-06-08 MED ORDER — HYDROCODONE-HOMATROPINE 5-1.5 MG/5ML PO SYRP: 5.0000 mL | ORAL_SOLUTION | Freq: Four times a day (QID) | ORAL | 0 refills | Status: DC | PRN

## 2017-06-08 MED ORDER — PREDNISONE 20 MG PO TABS: ORAL_TABLET | ORAL | 0 refills | Status: DC

## 2017-06-08 NOTE — Progress Notes (Signed)
   Subjective:    Patient ID: Karla Maxwell, female    DOB: [birth year], 67 y.o.   MRN: [id number]  HPI  Patient in c/o sore throat and achy, started last Thursday. Said she did nothing but sleep last weekend. Had fever and chills over the weekend. Felt some better yesterday.  She is also c/o left low back pain that radiates dwon back of left leg. Still hurting some today.  Review of Systems  Constitutional: Positive for appetite change, chills and fever.  HENT: Positive for congestion, rhinorrhea, sinus pain and sore throat. Negative for trouble swallowing.   Respiratory: Cough: worse when laying down.   Cardiovascular: Negative.   Gastrointestinal: Negative.   Neurological: Negative.   Psychiatric/Behavioral: Negative.   All other systems reviewed and are negative.      Objective:   Physical Exam  Constitutional: She is oriented to person, place, and time. She appears well-developed and well-nourished. No distress.  HENT:  Right Ear: Hearing, tympanic membrane, external ear and ear canal normal.  Left Ear: Hearing, tympanic membrane, external ear and ear canal normal.  Nose: Mucosal edema and rhinorrhea present. Right sinus exhibits no maxillary sinus tenderness and no frontal sinus tenderness. Left sinus exhibits no maxillary sinus tenderness and no frontal sinus tenderness.  Mouth/Throat: Uvula is midline, oropharynx is clear and moist and mucous membranes are normal.  Neck: Normal range of motion. Neck supple.  Cardiovascular: Normal rate and regular rhythm.  Pulmonary/Chest: Effort normal. She has wheezes (exp wheezes throughout).  Deep wet cough  Abdominal: Soft. Bowel sounds are normal.  Lymphadenopathy:    She has no cervical adenopathy.  Neurological: She is alert and oriented to person, place, and time.  Skin: Skin is warm and dry.  Psychiatric: She has a normal mood and affect. Her behavior is normal. Judgment and thought content normal.    BP (!) 144/75   Pulse 80    Temp (!) 97.4 F (36.3 C) (Oral)   Ht 5\' 2"  (1.575 m)   Wt 183 lb (83 kg)   BMI 33.47 kg/m        Assessment & Plan:  1. Body aches - Veritor Flu A/B Waived  2. Acute bronchitis with COPD ([place]) 1. Take meds as prescribed 2. Use a cool mist humidifier especially during the winter months and when heat has been humid. 3. Use saline nose sprays frequently 4. Saline irrigations of the nose can be very helpful if done frequently.  * 4X daily for 1 week*  * Use of a nettie pot can be helpful with this. Follow directions with this* 5. Drink plenty of fluids 6. Keep thermostat turn down low 7.For any cough or congestion  Use plain Mucinex- regular strength or max strength is fine   * Children- consult with Pharmacist for dosing 8. For fever or aces or pains- take tylenol or ibuprofen appropriate for age and weight.  * for fevers greater than 101 orally you may alternate ibuprofen and tylenol every  3 hours.    - methylPREDNISolone acetate (DEPO-MEDROL) injection 80 mg - HYDROcodone-homatropine (HYCODAN) 5-1.5 MG/5ML syrup; Take 5 mLs by mouth every 6 (six) hours as needed for cough.  Dispense: 120 mL; Refill: 0  3. Sciatica of left side Moist heat to back of leg Rest - predniSONE (DELTASONE) 20 MG tablet; 2 po at sametime daily for 5 days- start tomorrow  Dispense: 10 tablet; Refill: 0  RTO prn  Mary-Margaret Hassell Done, FNP

## 2017-06-08 NOTE — Patient Instructions (Signed)

## 2017-07-08 DIAGNOSIS — H2513 Age-related nuclear cataract, bilateral: Secondary | ICD-10-CM | POA: Diagnosis not present

## 2017-07-08 DIAGNOSIS — H40033 Anatomical narrow angle, bilateral: Secondary | ICD-10-CM | POA: Diagnosis not present

## 2017-08-03 ENCOUNTER — Ambulatory Visit (INDEPENDENT_AMBULATORY_CARE_PROVIDER_SITE_OTHER): Payer: Medicare HMO | Admitting: Nurse Practitioner

## 2017-08-03 ENCOUNTER — Encounter: Payer: Self-pay | Admitting: Nurse Practitioner

## 2017-08-03 ENCOUNTER — Ambulatory Visit (INDEPENDENT_AMBULATORY_CARE_PROVIDER_SITE_OTHER): Payer: Medicare HMO

## 2017-08-03 VITALS — BP 142/61 | HR 68 | Temp 96.7°F | Ht 62.0 in | Wt 185.0 lb

## 2017-08-03 DIAGNOSIS — Z1211 Encounter for screening for malignant neoplasm of colon: Secondary | ICD-10-CM | POA: Diagnosis not present

## 2017-08-03 DIAGNOSIS — K219 Gastro-esophageal reflux disease without esophagitis: Secondary | ICD-10-CM

## 2017-08-03 DIAGNOSIS — E039 Hypothyroidism, unspecified: Secondary | ICD-10-CM | POA: Diagnosis not present

## 2017-08-03 DIAGNOSIS — E782 Mixed hyperlipidemia: Secondary | ICD-10-CM

## 2017-08-03 DIAGNOSIS — J4520 Mild intermittent asthma, uncomplicated: Secondary | ICD-10-CM | POA: Diagnosis not present

## 2017-08-03 DIAGNOSIS — M8580 Other specified disorders of bone density and structure, unspecified site: Secondary | ICD-10-CM | POA: Diagnosis not present

## 2017-08-03 DIAGNOSIS — F330 Major depressive disorder, recurrent, mild: Secondary | ICD-10-CM | POA: Diagnosis not present

## 2017-08-03 DIAGNOSIS — Z6833 Body mass index (BMI) 33.0-33.9, adult: Secondary | ICD-10-CM | POA: Diagnosis not present

## 2017-08-03 DIAGNOSIS — M8588 Other specified disorders of bone density and structure, other site: Secondary | ICD-10-CM | POA: Diagnosis not present

## 2017-08-03 DIAGNOSIS — I10 Essential (primary) hypertension: Secondary | ICD-10-CM | POA: Diagnosis not present

## 2017-08-03 DIAGNOSIS — Z1212 Encounter for screening for malignant neoplasm of rectum: Secondary | ICD-10-CM | POA: Diagnosis not present

## 2017-08-03 MED ORDER — MONTELUKAST SODIUM 10 MG PO TABS: 10.0000 mg | ORAL_TABLET | Freq: Every day | ORAL | 1 refills | Status: DC

## 2017-08-03 MED ORDER — ATORVASTATIN CALCIUM 80 MG PO TABS: 80.0000 mg | ORAL_TABLET | Freq: Every day | ORAL | 1 refills | Status: DC

## 2017-08-03 MED ORDER — FENOFIBRATE MICRONIZED 134 MG PO CAPS
ORAL_CAPSULE | ORAL | 1 refills | Status: DC
Start: 2017-08-03 — End: 2018-02-03

## 2017-08-03 MED ORDER — BUDESONIDE-FORMOTEROL FUMARATE 160-4.5 MCG/ACT IN AERO: INHALATION_SPRAY | RESPIRATORY_TRACT | 1 refills | Status: DC

## 2017-08-03 MED ORDER — LEVOTHYROXINE SODIUM 75 MCG PO TABS: ORAL_TABLET | ORAL | 1 refills | Status: DC

## 2017-08-03 MED ORDER — AMLODIPINE BESYLATE 5 MG PO TABS: ORAL_TABLET | ORAL | 1 refills | Status: DC

## 2017-08-03 MED ORDER — OMEPRAZOLE 40 MG PO CPDR: 40.0000 mg | DELAYED_RELEASE_CAPSULE | Freq: Every day | ORAL | 1 refills | Status: DC

## 2017-08-03 NOTE — Patient Instructions (Signed)
Stress and Stress Management Stress is a normal reaction to life events. It is what you feel when life demands more than you are used to or more than you can handle. Some stress can be useful. For example, the stress reaction can help you catch the last bus of the day, study for a test, or meet a deadline at work. But stress that occurs too often or for too long can cause problems. It can affect your emotional health and interfere with relationships and normal daily activities. Too much stress can weaken your immune system and increase your risk for physical illness. If you already have a medical problem, stress can make it worse. What are the causes? All sorts of life events may cause stress. An event that causes stress for one person may not be stressful for another person. Major life events commonly cause stress. These may be positive or negative. Examples include losing your job, moving into a new home, getting married, having a baby, or losing a loved one. Less obvious life events may also cause stress, especially if they occur day after day or in combination. Examples include working long hours, driving in traffic, caring for children, being in debt, or being in a difficult relationship. What are the signs or symptoms? Stress may cause emotional symptoms including, the following:  Anxiety. This is feeling worried, afraid, on edge, overwhelmed, or out of control.  Anger. This is feeling irritated or impatient.  Depression. This is feeling sad, down, helpless, or guilty.  Difficulty focusing, remembering, or making decisions.  Stress may cause physical symptoms, including the following:  Aches and pains. These may affect your head, neck, back, stomach, or other areas of your body.  Tight muscles or clenched jaw.  Low energy or trouble sleeping.  Stress may cause unhealthy behaviors, including the following:  Eating to feel better (overeating) or skipping meals.  Sleeping too little,  too much, or both.  Working too much or putting off tasks (procrastination).  Smoking, drinking alcohol, or using drugs to feel better.  How is this diagnosed? Stress is diagnosed through an assessment by your health care provider. Your health care provider will ask questions about your symptoms and any stressful life events.Your health care provider will also ask about your medical history and may order blood tests or other tests. Certain medical conditions and medicine can cause physical symptoms similar to stress. Mental illness can cause emotional symptoms and unhealthy behaviors similar to stress. Your health care provider may refer you to a mental health professional for further evaluation. How is this treated? Stress management is the recommended treatment for stress.The goals of stress management are reducing stressful life events and coping with stress in healthy ways. Techniques for reducing stressful life events include the following:  Stress identification. Self-monitor for stress and identify what causes stress for you. These skills may help you to avoid some stressful events.  Time management. Set your priorities, keep a calendar of events, and learn to say "no." These tools can help you avoid making too many commitments.  Techniques for coping with stress include the following:  Rethinking the problem. Try to think realistically about stressful events rather than ignoring them or overreacting. Try to find the positives in a stressful situation rather than focusing on the negatives.  Exercise. Physical exercise can release both physical and emotional tension. The key is to find a form of exercise you enjoy and do it regularly.  Relaxation techniques. These relax the body and  mind. Examples include yoga, meditation, tai chi, biofeedback, deep breathing, progressive muscle relaxation, listening to music, being out in nature, journaling, and other hobbies. Again, the key is to find  one or more that you enjoy and can do regularly.  Healthy lifestyle. Eat a balanced diet, get plenty of sleep, and do not smoke. Avoid using alcohol or drugs to relax.  Strong support network. Spend time with family, friends, or other people you enjoy being around.Express your feelings and talk things over with someone you trust.  Counseling or talktherapy with a mental health professional may be helpful if you are having difficulty managing stress on your own. Medicine is typically not recommended for the treatment of stress.Talk to your health care provider if you think you need medicine for symptoms of stress. Follow these instructions at home:  Keep all follow-up visits as directed by your health care provider.  Take all medicines as directed by your health care provider. Contact a health care provider if:  Your symptoms get worse or you start having new symptoms.  You feel overwhelmed by your problems and can no longer manage them on your own. Get help right away if:  You feel like hurting yourself or someone else. This information is not intended to replace advice given to you by your health care provider. Make sure you discuss any questions you have with your health care provider. Document Released: 10/14/2000 Document Revised: 09/26/2015 Document Reviewed: 12/13/2012 [employer] Interactive Patient Education  2017 [employer]  

## 2017-08-03 NOTE — Progress Notes (Addendum)
Subjective:    Patient ID: Karla Maxwell, female    DOB: Apr 10, 1951, 67 y.o.   MRN: [id number]  HPI  Karla Maxwell is here today for follow up of chronic medical problem.  Outpatient Encounter Medications as of 08/03/2017  Medication Sig  . amLODipine (NORVASC) 5 MG tablet TAKE two TABLETS BY MOUTH ONE  TIME DAILY  . aspirin EC 81 MG tablet Take 162 mg by mouth daily.  Marland Kitchen atorvastatin (LIPITOR) 80 MG tablet Take 1 tablet (80 mg total) by mouth daily.  . budesonide-formoterol (SYMBICORT) 160-4.5 MCG/ACT inhaler INHALE 2 PUFFS BY MOUTH INTO  THE LUNGS TWICE A DAY AS INSTRUCTED  . fenofibrate micronized (LOFIBRA) 134 MG capsule TAKE ONE CAPSULE BY MOUTH ONE TIME DAILY BEFORE BREAKFAST  . HYDROcodone-homatropine (HYCODAN) 5-1.5 MG/5ML syrup Take 5 mLs by mouth every 6 (six) hours as needed for cough.  . levothyroxine (SYNTHROID, LEVOTHROID) 75 MCG tablet TAKE 1 TABLET (75 MCG TOTAL)   BY MOUTH DAILY.  . montelukast (SINGULAIR) 10 MG tablet Take 1 tablet (10 mg total) by mouth daily with breakfast.  . omeprazole (PRILOSEC) 40 MG capsule Take 1 capsule (40 mg total) by mouth daily.  . predniSONE (DELTASONE) 20 MG tablet 2 po at sametime daily for 5 days- start tomorrow     1. Essential hypertension  No c/o chest pain, SOB or headache. Does not check blood pressure at home. BP Readings from Last 3 Encounters:  06/08/17 (!) 144/75  02/25/17 (!) 146/78  08/26/16 136/83     2. Mild intermittent asthma without complication  Is on symbicort and singulair. Is doing well right  Now. This is the time of year where she usually starts having flare up.  3. Gastroesophageal reflux disease, esophagitis presence not specified  Has bad reflux in which she takes omeprazole daily  4. Acquired hypothyroidism  No problems that she is aware of  5. Osteopenia, unspecified location  No c/o back pain. Last dexascan was 07/11/14 11.0 that puts her in normal range  6. Mild episode of recurrent major depressive  disorder ([place])  Currently not on anything. Says she Is doing well.. Does better in spring and summer when she can get outside.  7. Mixed hyperlipidemia  Has not been watching diet. Does better in summer when she has fresh vegetables to eat  8. BMI 33.0-33.9,adult  No recent weight changes    New complaints: None today  Social history: Does not work. 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  HENT: Negative.   Eyes: Negative for pain.  Respiratory: Negative for shortness of breath.   Cardiovascular: Negative for chest pain, palpitations and leg swelling.  Gastrointestinal: Negative for abdominal pain.  Endocrine: Negative for polydipsia.  Genitourinary: Negative.   Skin: Negative for rash.  Neurological: Negative for dizziness, weakness and headaches.  Hematological: Does not bruise/bleed easily.  Psychiatric/Behavioral: Negative.   All other systems reviewed and are negative.      Objective:   Physical Exam  Constitutional: She is oriented to person, place, and time. She appears well-developed and well-nourished.  HENT:  Nose: Nose normal.  Mouth/Throat: Oropharynx is clear and moist.  Eyes: EOM are normal.  Neck: Trachea normal, normal range of motion and full passive range of motion without pain. Neck supple. No JVD present. Carotid bruit is not present. No thyromegaly present.  Cardiovascular: Normal rate, regular rhythm, normal heart sounds and intact distal pulses.  Exam reveals no gallop and no friction rub.  No murmur heard. Pulmonary/Chest: Effort normal and breath sounds normal.  Abdominal: Soft. Bowel sounds are normal. She exhibits no distension and no mass. There is no tenderness.  Musculoskeletal: Normal range of motion.  Right above elbow amputee  Lymphadenopathy:    She has no cervical adenopathy.  Neurological: She is alert and oriented to person, place, and time. She has normal reflexes.    Skin: Skin is warm and dry.  Psychiatric: She has a normal mood and affect. Her behavior is normal. Judgment and thought content normal.   BP (!) 142/61   Pulse 68   Temp (!) 96.7 F (35.9 C) (Oral)   Ht '5\' 2"'  (1.575 m)   Wt 185 lb (83.9 kg)   BMI 33.84 kg/m         Assessment & Plan:  1. Essential hypertension Low sodium diet - amLODipine (NORVASC) 5 MG tablet; TAKE two TABLETS BY MOUTH ONE  TIME DAILY  Dispense: 180 tablet; Refill: 1 - CMP14+EGFR  2. Mild intermittent asthma without complication continue all meds - montelukast (SINGULAIR) 10 MG tablet; Take 1 tablet (10 mg total) by mouth daily with breakfast.  Dispense: 90 tablet; Refill: 1 - budesonide-formoterol (SYMBICORT) 160-4.5 MCG/ACT inhaler; INHALE 2 PUFFS BY MOUTH INTO  THE LUNGS TWICE A DAY AS INSTRUCTED  Dispense: 33 g; Refill: 1  3. Gastroesophageal reflux disease, esophagitis presence not specified Avoid spicy foods Do not eat 2 hours prior to bedtime - omeprazole (PRILOSEC) 40 MG capsule; Take 1 capsule (40 mg total) by mouth daily.  Dispense: 90 capsule; Refill: 1  4. Acquired hypothyroidism - levothyroxine (SYNTHROID, LEVOTHROID) 75 MCG tablet; TAKE 1 TABLET (75 MCG TOTAL)   BY MOUTH DAILY.  Dispense: 90 tablet; Refill: 1 - Thyroid Panel With TSH  5. Osteopenia, unspecified location Weight bearing exercises encouraged - DG [facility] DEXA  6. Mild episode of recurrent major depressive disorder ([place]) Stress management  7. Mixed hyperlipidemia Low fta diet - atorvastatin (LIPITOR) 80 MG tablet; Take 1 tablet (80 mg total) by mouth daily.  Dispense: 90 tablet; Refill: 1 - fenofibrate micronized (LOFIBRA) 134 MG capsule; TAKE ONE CAPSULE BY MOUTH ONE TIME DAILY BEFORE BREAKFAST  Dispense: 90 capsule; Refill: 1 - Lipid panel  8. BMI 33.0-33.9,adult Discussed diet and exercise for person with BMI >25 Will recheck weight in 3-6 months   cologuard ordered Labs pending Health maintenance reviewed Diet  and exercise encouraged Continue all meds Follow up  In 6 months   [place], FNP

## 2017-08-03 NOTE — Addendum Note (Signed)
Addended by: Chevis Pretty on: 08/03/2017 10:28 AM   Modules accepted: Orders

## 2017-08-04 DIAGNOSIS — Z13820 Encounter for screening for osteoporosis: Secondary | ICD-10-CM | POA: Diagnosis not present

## 2017-08-04 DIAGNOSIS — Z780 Asymptomatic menopausal state: Secondary | ICD-10-CM | POA: Diagnosis not present

## 2017-08-04 LAB — CMP14+EGFR
A/G RATIO: 1.9 (ref 1.2–2.2)
ALT: 24 IU/L (ref 0–32)
AST: 20 IU/L (ref 0–40)
Albumin: 4.6 g/dL (ref 3.6–4.8)
Alkaline Phosphatase: 53 IU/L (ref 39–117)
BILIRUBIN TOTAL: 0.4 mg/dL (ref 0.0–1.2)
BUN/Creatinine Ratio: 11 — ABNORMAL LOW (ref 12–28)
BUN: 9 mg/dL (ref 8–27)
CHLORIDE: 104 mmol/L (ref 96–106)
CO2: 22 mmol/L (ref 20–29)
Calcium: 10.2 mg/dL (ref 8.7–10.3)
Creatinine, Ser: 0.83 mg/dL (ref 0.57–1.00)
GFR calc Af Amer: 85 mL/min/{1.73_m2} (ref >59)
GFR calc non Af Amer: 74 mL/min/{1.73_m2} (ref >59)
GLUCOSE: 110 mg/dL — AB (ref 65–99)
Globulin, Total: 2.4 g/dL (ref 1.5–4.5)
POTASSIUM: 4.6 mmol/L (ref 3.5–5.2)
Sodium: 142 mmol/L (ref 134–144)
Total Protein: 7 g/dL (ref 6.0–8.5)

## 2017-08-04 LAB — THYROID PANEL WITH TSH
Free Thyroxine Index: 2.6 (ref 1.2–4.9)
T3 Uptake Ratio: 26 % (ref 24–39)
T4 TOTAL: 10.1 ug/dL (ref 4.5–12.0)
TSH: 1.49 u[IU]/mL (ref 0.450–4.500)

## 2017-08-04 LAB — LIPID PANEL
CHOL/HDL RATIO: 3 ratio (ref 0.0–4.4)
Cholesterol, Total: 141 mg/dL (ref 100–199)
HDL: 47 mg/dL (ref >39)
LDL CALC: 68 mg/dL (ref 0–99)
TRIGLYCERIDES: 132 mg/dL (ref 0–149)
VLDL CHOLESTEROL CAL: 26 mg/dL (ref 5–40)

## 2017-08-10 ENCOUNTER — Encounter: Payer: Self-pay | Admitting: *Deleted

## 2017-08-10 ENCOUNTER — Ambulatory Visit (INDEPENDENT_AMBULATORY_CARE_PROVIDER_SITE_OTHER): Payer: Medicare HMO | Admitting: *Deleted

## 2017-08-10 VITALS — BP 125/65 | HR 75 | Ht 61.5 in | Wt 187.0 lb

## 2017-08-10 DIAGNOSIS — Z0000 Encounter for general adult medical examination without abnormal findings: Secondary | ICD-10-CM

## 2017-08-10 DIAGNOSIS — Z1211 Encounter for screening for malignant neoplasm of colon: Secondary | ICD-10-CM | POA: Diagnosis not present

## 2017-08-10 DIAGNOSIS — Z1212 Encounter for screening for malignant neoplasm of rectum: Secondary | ICD-10-CM | POA: Diagnosis not present

## 2017-08-10 NOTE — Patient Instructions (Signed)
  Karla Maxwell , Thank you for taking time to come for your Medicare Wellness Visit. I appreciate your ongoing commitment to your health goals. Please review the following plan we discussed and let me know if I can assist you in the future.   These are the goals we discussed: Goals    . Exercise 150 min/wk Moderate Activity       This is a list of the screening recommended for you and due dates:  Health Maintenance  Topic Date Due  . Cologuard (Stool DNA test)  10/29/2000  . Stool Blood Test  08/27/2017  . Mammogram  07/24/2018  . Tetanus Vaccine  04/04/2019  . DEXA scan (bone density measurement)  08/05/2019  .  Hepatitis C: One time screening is recommended by Center for Disease Control  (CDC) for  adults born from [birth year] through 1965.   Completed  . Pneumonia vaccines  Completed  . Flu Shot  Discontinued
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
## 2017-08-20 LAB — COLOGUARD: COLOGUARD: NEGATIVE

## 2018-02-03 ENCOUNTER — Ambulatory Visit (INDEPENDENT_AMBULATORY_CARE_PROVIDER_SITE_OTHER): Payer: Medicare HMO | Admitting: Nurse Practitioner

## 2018-02-03 ENCOUNTER — Encounter: Payer: Self-pay | Admitting: Nurse Practitioner

## 2018-02-03 VITALS — BP 140/71 | HR 66 | Temp 97.0°F | Ht 61.0 in | Wt 185.0 lb

## 2018-02-03 DIAGNOSIS — Z6833 Body mass index (BMI) 33.0-33.9, adult: Secondary | ICD-10-CM

## 2018-02-03 DIAGNOSIS — I10 Essential (primary) hypertension: Secondary | ICD-10-CM | POA: Diagnosis not present

## 2018-02-03 DIAGNOSIS — E8881 Metabolic syndrome: Secondary | ICD-10-CM | POA: Insufficient documentation

## 2018-02-03 DIAGNOSIS — F330 Major depressive disorder, recurrent, mild: Secondary | ICD-10-CM | POA: Diagnosis not present

## 2018-02-03 DIAGNOSIS — J4520 Mild intermittent asthma, uncomplicated: Secondary | ICD-10-CM | POA: Diagnosis not present

## 2018-02-03 DIAGNOSIS — K219 Gastro-esophageal reflux disease without esophagitis: Secondary | ICD-10-CM | POA: Diagnosis not present

## 2018-02-03 DIAGNOSIS — E782 Mixed hyperlipidemia: Secondary | ICD-10-CM

## 2018-02-03 DIAGNOSIS — E039 Hypothyroidism, unspecified: Secondary | ICD-10-CM | POA: Diagnosis not present

## 2018-02-03 DIAGNOSIS — R739 Hyperglycemia, unspecified: Secondary | ICD-10-CM | POA: Diagnosis not present

## 2018-02-03 LAB — BAYER DCA HB A1C WAIVED: HB A1C (BAYER DCA - WAIVED): 5.8 % (ref <7.0)

## 2018-02-03 MED ORDER — FENOFIBRATE MICRONIZED 134 MG PO CAPS: ORAL_CAPSULE | ORAL | 1 refills | Status: DC

## 2018-02-03 MED ORDER — AMLODIPINE BESYLATE 5 MG PO TABS: ORAL_TABLET | ORAL | 1 refills | Status: DC

## 2018-02-03 MED ORDER — ATORVASTATIN CALCIUM 80 MG PO TABS: 80.0000 mg | ORAL_TABLET | Freq: Every day | ORAL | 1 refills | Status: DC

## 2018-02-03 MED ORDER — LEVOTHYROXINE SODIUM 75 MCG PO TABS: ORAL_TABLET | ORAL | 1 refills | Status: DC

## 2018-02-03 MED ORDER — MONTELUKAST SODIUM 10 MG PO TABS: 10.0000 mg | ORAL_TABLET | Freq: Every day | ORAL | 1 refills | Status: DC

## 2018-02-03 MED ORDER — BUDESONIDE-FORMOTEROL FUMARATE 160-4.5 MCG/ACT IN AERO
INHALATION_SPRAY | RESPIRATORY_TRACT | 1 refills | Status: DC
Start: 2018-02-03 — End: 2018-02-07

## 2018-02-03 MED ORDER — OMEPRAZOLE 40 MG PO CPDR: 40.0000 mg | DELAYED_RELEASE_CAPSULE | Freq: Every day | ORAL | 1 refills | Status: DC

## 2018-02-03 NOTE — Progress Notes (Signed)
Subjective:    Patient ID: Karla Maxwell, female    DOB: [birth year], 67 y.o.   MRN: [id number]   Chief Complaint: medical management of chronic issues  HPI:  1. Essential hypertension  No c/o chest pain, sob or headache. Does not check blood pressure at home very often.  BP Readings from Last 3 Encounters:  08/10/17 125/65  08/03/17 (!) 142/61  06/08/17 (!) 144/75     2. Gastroesophageal reflux disease, esophagitis presence not specified  Takes daily dose of omeprazole. Keeps symptoms under control  3. Acquired hypothyroidism  Not having any problems that she is aware of.  4. Mixed hyperlipidemia  Watches diet some days but does not watch other days  5. BMI 33.0-33.9,adult  No recent weight change  6. Mild episode of recurrent major depressive disorder ([place])  Is currently not on an antidepressant. Depression screen [facility] 2/9 02/03/2018 08/10/2017 08/03/2017  Decreased Interest 1 0 0  Down, Depressed, Hopeless 1 0 0  PHQ - 2 Score 2 0 0  Altered sleeping 1 - -  Tired, decreased energy 2 - -  Change in appetite 1 - -  Feeling bad or failure about yourself  0 - -  Trouble concentrating 0 - -  Moving slowly or fidgety/restless 0 - -  Suicidal thoughts 0 - -  PHQ-9 Score 6 - -  Difficult doing work/chores - - -      7.      Metabolic syndrome          Does not check blood sugars very often. Last hgab1c was 5.8%.   Outpatient Encounter Medications as of 02/03/2018  Medication Sig  . amLODipine (NORVASC) 5 MG tablet TAKE two TABLETS BY MOUTH ONE  TIME DAILY  . aspirin EC 81 MG tablet Take 162 mg by mouth daily.  Marland Kitchen atorvastatin (LIPITOR) 80 MG tablet Take 1 tablet (80 mg total) by mouth daily.  Marland Kitchen azelastine (OPTIVAR) 0.05 % ophthalmic solution   . budesonide-formoterol (SYMBICORT) 160-4.5 MCG/ACT inhaler INHALE 2 PUFFS BY MOUTH INTO  THE LUNGS TWICE A DAY AS INSTRUCTED  . fenofibrate micronized (LOFIBRA) 134 MG capsule TAKE ONE CAPSULE BY MOUTH ONE TIME DAILY BEFORE BREAKFAST   . levothyroxine (SYNTHROID, LEVOTHROID) 75 MCG tablet TAKE 1 TABLET (75 MCG TOTAL)   BY MOUTH DAILY.  . montelukast (SINGULAIR) 10 MG tablet Take 1 tablet (10 mg total) by mouth daily with breakfast.  . omeprazole (PRILOSEC) 40 MG capsule Take 1 capsule (40 mg total) by mouth daily.     New complaints: None today  Social history: Lives with husband  Review of Systems  Constitutional: Negative for activity change and appetite change.  HENT: Negative.   Eyes: Negative for pain.  Respiratory: Negative for shortness of breath.   Cardiovascular: Negative for chest pain, palpitations and leg swelling.  Gastrointestinal: Negative for abdominal pain.  Endocrine: Negative for polydipsia.  Genitourinary: Negative.   Skin: Negative for rash.  Neurological: Negative for dizziness, weakness and headaches.  Hematological: Does not bruise/bleed easily.  Psychiatric/Behavioral: Negative.   All other systems reviewed and are negative.      Objective:   Physical Exam  Constitutional: She is oriented to person, place, and time. She appears well-developed and well-nourished. No distress.  HENT:  Head: Normocephalic.  Nose: Nose normal.  Mouth/Throat: Oropharynx is clear and moist.  Eyes: Pupils are equal, round, and reactive to light. EOM are normal.  Neck: Normal range of motion. Neck supple. No JVD present. Carotid  bruit is not present.  Cardiovascular: Normal rate, regular rhythm, normal heart sounds and intact distal pulses.  Pulmonary/Chest: Effort normal and breath sounds normal. No respiratory distress. She has no wheezes. She has no rales. She exhibits no tenderness.  Abdominal: Soft. Normal appearance, normal aorta and bowel sounds are normal. She exhibits no distension, no abdominal bruit, no pulsatile midline mass and no mass. There is no splenomegaly or hepatomegaly. There is no tenderness.  Musculoskeletal: Normal range of motion. She exhibits no edema.  Right above elbow  aamputee  Lymphadenopathy:    She has no cervical adenopathy.  Neurological: She is alert and oriented to person, place, and time. She has normal reflexes.  Skin: Skin is warm and dry.  Psychiatric: She has a normal mood and affect. Her behavior is normal. Judgment and thought content normal.  Nursing note and vitals reviewed.  BP 140/71   Pulse 66   Temp (!) 97 F (36.1 C) (Oral)   Ht _0  (1.549 m)   Wt 185 lb (83.9 kg)   BMI 34.96 kg/m   hgba1c 5.8%      Assessment & Plan:  Karla Maxwell comes in today with chief complaint of No chief complaint on file.   Diagnosis and orders addressed:  1. Essential hypertension Low sodium diet - CMP14+EGFR - amLODipine (NORVASC) 5 MG tablet; TAKE two TABLETS BY MOUTH ONE  TIME DAILY  Dispense: 180 tablet; Refill: 1  2. Gastroesophageal reflux disease, esophagitis presence not specified Avoid spicy foods Do not eat 2 hours prior to bedtime - omeprazole (PRILOSEC) 40 MG capsule; Take 1 capsule (40 mg total) by mouth daily.  Dispense: 90 capsule; Refill: 1  3. Acquired hypothyroidism - levothyroxine (SYNTHROID, LEVOTHROID) 75 MCG tablet; TAKE 1 TABLET (75 MCG TOTAL)   BY MOUTH DAILY.  Dispense: 90 tablet; Refill: 1  4. Mixed hyperlipidemia Low fat diet - Lipid panel - atorvastatin (LIPITOR) 80 MG tablet; Take 1 tablet (80 mg total) by mouth daily.  Dispense: 90 tablet; Refill: 1 - fenofibrate micronized (LOFIBRA) 134 MG capsule; TAKE ONE CAPSULE BY MOUTH ONE TIME DAILY BEFORE BREAKFAST  Dispense: 90 capsule; Refill: 1  5. BMI 33.0-33.9,adult Discussed diet and exercise for person with BMI >25 Will recheck weight in 3-6 months  6. Mild episode of recurrent major depressive disorder ([place]) Stress management  7. Metabolic syndrome Watch carbs in diet - Bayer DCA Hb A1c Waived  8. Mild intermittent asthma without complication - montelukast (SINGULAIR) 10 MG tablet; Take 1 tablet (10 mg total) by mouth daily with breakfast.   Dispense: 90 tablet; Refill: 1 - budesonide-formoterol (SYMBICORT) 160-4.5 MCG/ACT inhaler; INHALE 2 PUFFS BY MOUTH INTO  THE LUNGS TWICE A DAY AS INSTRUCTED  Dispense: 33 g; Refill: 1   Labs pending Health Maintenance reviewed Diet and exercise encouraged  Follow up plan: 3 months   [place], FNP

## 2018-02-03 NOTE — Patient Instructions (Signed)
Diabetes Mellitus and Nutrition When you have diabetes (diabetes mellitus), it is very important to have healthy eating habits because your blood sugar (glucose) levels are greatly affected by what you eat and drink. Eating healthy foods in the appropriate amounts, at about the same times every day, can help you:  Control your blood glucose.  Lower your risk of heart disease.  Improve your blood pressure.  Reach or maintain a healthy weight.  Every person with diabetes is different, and each person has different needs for a meal plan. Your health care provider may recommend that you work with a diet and nutrition specialist (dietitian) to make a meal plan that is best for you. Your meal plan may vary depending on factors such as:  The calories you need.  The medicines you take.  Your weight.  Your blood glucose, blood pressure, and cholesterol levels.  Your activity level.  Other health conditions you have, such as heart or kidney disease.  How do carbohydrates affect me? Carbohydrates affect your blood glucose level more than any other type of food. Eating carbohydrates naturally increases the amount of glucose in your blood. Carbohydrate counting is a method for keeping track of how many carbohydrates you eat. Counting carbohydrates is important to keep your blood glucose at a healthy level, especially if you use insulin or take certain oral diabetes medicines. It is important to know how many carbohydrates you can safely have in each meal. This is different for every person. Your dietitian can help you calculate how many carbohydrates you should have at each meal and for snack. Foods that contain carbohydrates include:  Bread, cereal, rice, pasta, and crackers.  Potatoes and corn.  Peas, beans, and lentils.  Milk and yogurt.  Fruit and juice.  Desserts, such as cakes, cookies, ice cream, and candy.  How does alcohol affect me? Alcohol can cause a sudden decrease in blood  glucose (hypoglycemia), especially if you use insulin or take certain oral diabetes medicines. Hypoglycemia can be a life-threatening condition. Symptoms of hypoglycemia (sleepiness, dizziness, and confusion) are similar to symptoms of having too much alcohol. If your health care provider says that alcohol is safe for you, follow these guidelines:  Limit alcohol intake to no more than 1 drink per day for nonpregnant women and 2 drinks per day for men. One drink equals 12 oz of beer, 5 oz of wine, or 1 oz of hard liquor.  Do not drink on an empty stomach.  Keep yourself hydrated with water, diet soda, or unsweetened iced tea.  Keep in mind that regular soda, juice, and other mixers may contain a lot of sugar and must be counted as carbohydrates.  What are tips for following this plan? Reading food labels  Start by checking the serving size on the label. The amount of calories, carbohydrates, fats, and other nutrients listed on the label are based on one serving of the food. Many foods contain more than one serving per package.  Check the total grams (g) of carbohydrates in one serving. You can calculate the number of servings of carbohydrates in one serving by dividing the total carbohydrates by 15. For example, if a food has 30 g of total carbohydrates, it would be equal to 2 servings of carbohydrates.  Check the number of grams (g) of saturated and trans fats in one serving. Choose foods that have low or no amount of these fats.  Check the number of milligrams (mg) of sodium in one serving. Most people   should limit total sodium intake to less than 2,300 mg per day.  Always check the nutrition information of foods labeled as "low-fat" or "nonfat". These foods may be higher in added sugar or refined carbohydrates and should be avoided.  Talk to your dietitian to identify your daily goals for nutrients listed on the label. Shopping  Avoid buying canned, premade, or processed foods. These  foods tend to be high in fat, sodium, and added sugar.  Shop around the outside edge of the grocery store. This includes fresh fruits and vegetables, bulk grains, fresh meats, and fresh dairy. Cooking  Use low-heat cooking methods, such as baking, instead of high-heat cooking methods like deep frying.  Cook using healthy oils, such as olive, canola, or sunflower oil.  Avoid cooking with butter, cream, or high-fat meats. Meal planning  Eat meals and snacks regularly, preferably at the same times every day. Avoid going long periods of time without eating.  Eat foods high in fiber, such as fresh fruits, vegetables, beans, and whole grains. Talk to your dietitian about how many servings of carbohydrates you can eat at each meal.  Eat 4-6 ounces of lean protein each day, such as lean meat, chicken, fish, eggs, or tofu. 1 ounce is equal to 1 ounce of meat, chicken, or fish, 1 egg, or 1/4 cup of tofu.  Eat some foods each day that contain healthy fats, such as avocado, nuts, seeds, and fish. Lifestyle   Check your blood glucose regularly.  Exercise at least 30 minutes 5 or more days each week, or as told by your health care provider.  Take medicines as told by your health care provider.  Do not use any products that contain nicotine or tobacco, such as cigarettes and e-cigarettes. If you need help quitting, ask your health care provider.  Work with a counselor or diabetes educator to identify strategies to manage stress and any emotional and social challenges. What are some questions to ask my health care provider?  Do I need to meet with a diabetes educator?  Do I need to meet with a dietitian?  What number can I call if I have questions?  When are the best times to check my blood glucose? Where to find more information:  American Diabetes Association: diabetes.org/food-and-fitness/food  Academy of Nutrition and Dietetics:  www.eatright.org/resources/health/diseases-and-conditions/diabetes  National Institute of Diabetes and Digestive and Kidney Diseases (NIH): www.niddk.nih.gov/health-information/diabetes/overview/diet-eating-physical-activity Summary  A healthy meal plan will help you control your blood glucose and maintain a healthy lifestyle.  Working with a diet and nutrition specialist (dietitian) can help you make a meal plan that is best for you.  Keep in mind that carbohydrates and alcohol have immediate effects on your blood glucose levels. It is important to count carbohydrates and to use alcohol carefully. This information is not intended to replace advice given to you by your health care provider. Make sure you discuss any questions you have with your health care provider. Document Released: 01/15/2005 Document Revised: 05/25/2016 Document Reviewed: 05/25/2016 Elsevier Interactive Patient Education  2018 Elsevier Inc.  

## 2018-02-04 LAB — LIPID PANEL
CHOLESTEROL TOTAL: 146 mg/dL (ref 100–199)
Chol/HDL Ratio: 3.5 ratio (ref 0.0–4.4)
HDL: 42 mg/dL (ref >39)
LDL CALC: 71 mg/dL (ref 0–99)
TRIGLYCERIDES: 163 mg/dL — AB (ref 0–149)
VLDL CHOLESTEROL CAL: 33 mg/dL (ref 5–40)

## 2018-02-04 LAB — CMP14+EGFR
ALK PHOS: 54 IU/L (ref 39–117)
ALT: 19 IU/L (ref 0–32)
AST: 18 IU/L (ref 0–40)
Albumin/Globulin Ratio: 2 (ref 1.2–2.2)
Albumin: 4.5 g/dL (ref 3.6–4.8)
BILIRUBIN TOTAL: 0.4 mg/dL (ref 0.0–1.2)
BUN/Creatinine Ratio: 13 (ref 12–28)
BUN: 10 mg/dL (ref 8–27)
CHLORIDE: 101 mmol/L (ref 96–106)
CO2: 22 mmol/L (ref 20–29)
CREATININE: 0.78 mg/dL (ref 0.57–1.00)
Calcium: 9.9 mg/dL (ref 8.7–10.3)
GFR calc Af Amer: 91 mL/min/{1.73_m2} (ref >59)
GFR calc non Af Amer: 79 mL/min/{1.73_m2} (ref >59)
GLUCOSE: 116 mg/dL — AB (ref 65–99)
Globulin, Total: 2.2 g/dL (ref 1.5–4.5)
Potassium: 4.6 mmol/L (ref 3.5–5.2)
Sodium: 141 mmol/L (ref 134–144)
Total Protein: 6.7 g/dL (ref 6.0–8.5)

## 2018-02-07 ENCOUNTER — Telehealth: Payer: Self-pay | Admitting: Nurse Practitioner

## 2018-02-07 DIAGNOSIS — J4520 Mild intermittent asthma, uncomplicated: Secondary | ICD-10-CM

## 2018-02-07 DIAGNOSIS — K219 Gastro-esophageal reflux disease without esophagitis: Secondary | ICD-10-CM

## 2018-02-07 DIAGNOSIS — E039 Hypothyroidism, unspecified: Secondary | ICD-10-CM

## 2018-02-07 DIAGNOSIS — I10 Essential (primary) hypertension: Secondary | ICD-10-CM

## 2018-02-07 DIAGNOSIS — E782 Mixed hyperlipidemia: Secondary | ICD-10-CM

## 2018-02-07 MED ORDER — BUDESONIDE-FORMOTEROL FUMARATE 160-4.5 MCG/ACT IN AERO: INHALATION_SPRAY | RESPIRATORY_TRACT | 1 refills | Status: DC

## 2018-02-07 MED ORDER — OMEPRAZOLE 40 MG PO CPDR: 40.0000 mg | DELAYED_RELEASE_CAPSULE | Freq: Every day | ORAL | 1 refills | Status: DC

## 2018-02-07 MED ORDER — FENOFIBRATE MICRONIZED 134 MG PO CAPS: ORAL_CAPSULE | ORAL | 1 refills | Status: DC

## 2018-02-07 MED ORDER — LEVOTHYROXINE SODIUM 75 MCG PO TABS: ORAL_TABLET | ORAL | 1 refills | Status: DC

## 2018-02-07 MED ORDER — AMLODIPINE BESYLATE 5 MG PO TABS: ORAL_TABLET | ORAL | 1 refills | Status: DC

## 2018-02-07 MED ORDER — MONTELUKAST SODIUM 10 MG PO TABS: 10.0000 mg | ORAL_TABLET | Freq: Every day | ORAL | 1 refills | Status: DC

## 2018-02-07 MED ORDER — ATORVASTATIN CALCIUM 80 MG PO TABS: 80.0000 mg | ORAL_TABLET | Freq: Every day | ORAL | 1 refills | Status: DC

## 2018-02-07 NOTE — Telephone Encounter (Signed)
Pt is wanting to speak to MMM nurse about all of her medicines:  amLODipine (NORVASC) 5 MG tablet atorvastatin (LIPITOR) 80 MG tablet budesonide-formoterol (SYMBICORT) 160-4.5 MCG/ACT inhaler fenofibrate micronized (LOFIBRA) 134 MG capsule levothyroxine (SYNTHROID, LEVOTHROID) 75 MCG tablet     montelukast (SINGULAIR) 10 MG tablet    omeprazole (PRILOSEC) 40 MG capsule

## 2018-02-07 NOTE — Telephone Encounter (Signed)
Patient needed her medication sent to [facility] she could not afford it at [facility]. Rx sent to pharmacy

## 2018-05-09 ENCOUNTER — Ambulatory Visit (INDEPENDENT_AMBULATORY_CARE_PROVIDER_SITE_OTHER): Payer: Medicare HMO | Admitting: Nurse Practitioner

## 2018-05-09 ENCOUNTER — Encounter: Payer: Self-pay | Admitting: Nurse Practitioner

## 2018-05-09 VITALS — BP 136/82 | HR 80 | Temp 97.1°F | Ht 61.0 in | Wt 187.0 lb

## 2018-05-09 DIAGNOSIS — F330 Major depressive disorder, recurrent, mild: Secondary | ICD-10-CM

## 2018-05-09 DIAGNOSIS — K219 Gastro-esophageal reflux disease without esophagitis: Secondary | ICD-10-CM | POA: Diagnosis not present

## 2018-05-09 DIAGNOSIS — M8580 Other specified disorders of bone density and structure, unspecified site: Secondary | ICD-10-CM

## 2018-05-09 DIAGNOSIS — I10 Essential (primary) hypertension: Secondary | ICD-10-CM | POA: Diagnosis not present

## 2018-05-09 DIAGNOSIS — E039 Hypothyroidism, unspecified: Secondary | ICD-10-CM

## 2018-05-09 DIAGNOSIS — E782 Mixed hyperlipidemia: Secondary | ICD-10-CM

## 2018-05-09 DIAGNOSIS — E8881 Metabolic syndrome: Secondary | ICD-10-CM | POA: Diagnosis not present

## 2018-05-09 DIAGNOSIS — J4520 Mild intermittent asthma, uncomplicated: Secondary | ICD-10-CM | POA: Diagnosis not present

## 2018-05-09 DIAGNOSIS — Z6833 Body mass index (BMI) 33.0-33.9, adult: Secondary | ICD-10-CM | POA: Diagnosis not present

## 2018-05-09 NOTE — Patient Instructions (Signed)
Steps to Quit Smoking    Smoking tobacco can be bad for your health. It can also affect almost every organ in your body. Smoking puts you and people around you at risk for many serious long-lasting (chronic) diseases. Quitting smoking is hard, but it is one of the best things that you can do for your health. It is never too late to quit.  What are the benefits of quitting smoking?  When you quit smoking, you lower your risk for getting serious diseases and conditions. They can include:  · Lung cancer or lung disease.  · Heart disease.  · Stroke.  · Heart attack.  · Not being able to have children (infertility).  · Weak bones (osteoporosis) and broken bones (fractures).  If you have coughing, wheezing, and shortness of breath, those symptoms may get better when you quit. You may also get sick less often. If you are pregnant, quitting smoking can help to lower your chances of having a baby of low birth weight.  What can I do to help me quit smoking?  Talk with your doctor about what can help you quit smoking. Some things you can do (strategies) include:  · Quitting smoking totally, instead of slowly cutting back how much you smoke over a period of time.  · Going to in-person counseling. You are more likely to quit if you go to many counseling sessions.  · Using resources and support systems, such as:  ? Online chats with a counselor.  ? Phone quitlines.  ? Printed self-help materials.  ? Support groups or group counseling.  ? Text messaging programs.  ? Mobile phone apps or applications.  · Taking medicines. Some of these medicines may have nicotine in them. If you are pregnant or breastfeeding, do not take any medicines to quit smoking unless your doctor says it is okay. Talk with your doctor about counseling or other things that can help you.  Talk with your doctor about using more than one strategy at the same time, such as taking medicines while you are also going to in-person counseling. This can help make  quitting easier.  What things can I do to make it easier to quit?  Quitting smoking might feel very hard at first, but there is a lot that you can do to make it easier. Take these steps:  · Talk to your family and friends. Ask them to support and encourage you.  · Call phone quitlines, reach out to support groups, or work with a counselor.  · Ask people who smoke to not smoke around you.  · Avoid places that make you want (trigger) to smoke, such as:  ? Bars.  ? Parties.  ? Smoke-break areas at work.  · Spend time with people who do not smoke.  · Lower the stress in your life. Stress can make you want to smoke. Try these things to help your stress:  ? Getting regular exercise.  ? Deep-breathing exercises.  ? Yoga.  ? Meditating.  ? Doing a body scan. To do this, close your eyes, focus on one area of your body at a time from head to toe, and notice which parts of your body are tense. Try to relax the muscles in those areas.  · Download or buy apps on your mobile phone or tablet that can help you stick to your quit plan. There are many free apps, such as QuitGuide from the CDC (Centers for Disease Control and Prevention). You can find more   support from smokefree.gov and other websites.  This information is not intended to replace advice given to you by your health care provider. Make sure you discuss any questions you have with your health care provider.  Document Released: 02/14/2009 Document Revised: 12/17/2015 Document Reviewed: 09/04/2014  Elsevier Interactive Patient Education © 2019 Elsevier Inc.

## 2018-05-09 NOTE — Progress Notes (Signed)
Subjective:    Patient ID: Karla Maxwell, female    DOB: 08-05-1950, 68 y.o.   MRN: [id number]   Chief Complaint: Medical Management of Chronic Issues   HPI:  1. Gastroesophageal reflux disease, esophagitis presence not specified  takes omeprazole daily- works well most days. Sometimes her diet will flare up her symptoms.  2. Acquired hypothyroidism  No problems that she is aware of  3. Osteopenia, unspecified location  Last dexascan was 08/04/17. t scoreof -0.7. is on no meds. Does not take vitamin d or calcium supplement. Does no weight bearing exerise  4. Mild episode of recurrent major depressive disorder (HCC)  Is currenlty on no meds Depression screen [facility] 2/9 05/09/2018 02/03/2018 08/10/2017  Decreased Interest 0 1 0  Down, Depressed, Hopeless 0 1 0  PHQ - 2 Score 0 2 0  Altered sleeping - 1 -  Tired, decreased energy - 2 -  Change in appetite - 1 -  Feeling bad or failure about yourself  - 0 -  Trouble concentrating - 0 -  Moving slowly or fidgety/restless - 0 -  Suicidal thoughts - 0 -  PHQ-9 Score - 6 -  Difficult doing work/chores - - -     5. Metabolic syndrome  Doe sot check blood sugars at home. Has not been watching diet  6. Mixed hyperlipidemia  Has not been watching diet  7. BMI 33.0-33.9,adult  Weight is up 2lbs from last visit  8.      Hypertension          No c/o chest pain, sob or headache. Doe snot check blood pressure at                Home.           BP Readings from Last 3 Encounters:  05/09/18 (!) 154/79  02/03/18 140/71  08/10/17 125/65   9.       Bronchial asthma           symbicort daily- she does smoke over a pack a day.    Outpatient Encounter Medications as of 05/09/2018  Medication Sig  . amLODipine (NORVASC) 5 MG tablet TAKE two TABLETS BY MOUTH ONE  TIME DAILY  . aspirin EC 81 MG tablet Take 162 mg by mouth daily.  Marland Kitchen atorvastatin (LIPITOR) 80 MG tablet Take 1 tablet (80 mg total) by mouth daily.  Marland Kitchen azelastine (OPTIVAR) 0.05 %  ophthalmic solution   . budesonide-formoterol (SYMBICORT) 160-4.5 MCG/ACT inhaler INHALE 2 PUFFS BY MOUTH INTO  THE LUNGS TWICE A DAY AS INSTRUCTED  . fenofibrate micronized (LOFIBRA) 134 MG capsule TAKE ONE CAPSULE BY MOUTH ONE TIME DAILY BEFORE BREAKFAST  . levothyroxine (SYNTHROID, LEVOTHROID) 75 MCG tablet TAKE 1 TABLET (75 MCG TOTAL)   BY MOUTH DAILY.  . montelukast (SINGULAIR) 10 MG tablet Take 1 tablet (10 mg total) by mouth daily with breakfast.  . omeprazole (PRILOSEC) 40 MG capsule Take 1 capsule (40 mg total) by mouth daily.     New complaints: None today  Social history: Lives with husband- does not work.   Review of Systems  Constitutional: Negative for activity change and appetite change.  HENT: Negative.   Eyes: Negative for pain.  Respiratory: Negative for shortness of breath.   Cardiovascular: Negative for chest pain, palpitations and leg swelling.  Gastrointestinal: Negative for abdominal pain.  Endocrine: Negative for polydipsia.  Genitourinary: Negative.   Skin: Negative for rash.  Neurological: Negative for dizziness, weakness and headaches.  Hematological: Does not bruise/bleed easily.  Psychiatric/Behavioral: Negative.   All other systems reviewed and are negative.      Objective:   Physical Exam Vitals signs and nursing note reviewed.  Constitutional:      General: She is not in acute distress.    Appearance: Normal appearance. She is well-developed.  HENT:     Head: Normocephalic.     Nose: Nose normal.  Eyes:     Pupils: Pupils are equal, round, and reactive to light.  Neck:     Musculoskeletal: Normal range of motion and neck supple.     Vascular: No carotid bruit or JVD.  Cardiovascular:     Rate and Rhythm: Normal rate and regular rhythm.     Heart sounds: Normal heart sounds.  Pulmonary:     Effort: Pulmonary effort is normal. No respiratory distress.     Breath sounds: Normal breath sounds. No wheezing or rales.  Chest:     Chest  wall: No tenderness.  Abdominal:     General: Bowel sounds are normal. There is no distension or abdominal bruit.     Palpations: Abdomen is soft. There is no hepatomegaly, splenomegaly, mass or pulsatile mass.     Tenderness: There is no abdominal tenderness.  Musculoskeletal: Normal range of motion.     Comments: above elbow amputation of right arm  Lymphadenopathy:     Cervical: No cervical adenopathy.  Skin:    General: Skin is warm and dry.  Neurological:     Mental Status: She is alert and oriented to person, place, and time.     Deep Tendon Reflexes: Reflexes are normal and symmetric.  Psychiatric:        Behavior: Behavior normal.        Thought Content: Thought content normal.        Judgment: Judgment normal.     BP 136/82 (BP Location: Left Arm, Cuff Size: Normal)   Pulse 80   Temp (!) 97.1 F (36.2 C) (Oral)   Ht '5\' 1"'  (1.549 m)   Wt 187 lb (84.8 kg)   BMI 35.33 kg/m         Assessment & Plan:  Karla Maxwell comes in today with chief complaint of Medical Management of Chronic Issues   Diagnosis and orders addressed:  1. Gastroesophageal reflux disease, esophagitis presence not specified Avoid spicy foods Do not eat 2 hours prior to bedtime  2. Acquired hypothyroidism - Thyroid Panel With TSH  3. Osteopenia, unspecified location Weight bearing exercise Vitamin d and calcium supplement daily  4. Mild episode of recurrent major depressive disorder ([place]) Stress management  5. Metabolic syndrome Watch carbs in diet  6. Mixed hyperlipidemia Low fat diet - Lipid panel  7. BMI 33.0-33.9,adult Discussed diet and exercise for person with BMI >25 Will recheck weight in 3-6 months  8. Essential hypertension Low sodium diet - CMP14+EGFR  9. Mild intermittent asthma without complication Continue symbicort as rx Smoking cessation discussed   Labs pending Health Maintenance reviewed Diet and exercise encouraged  Follow up plan: 3  months   Karla Hassell Done, FNP

## 2018-05-10 LAB — CMP14+EGFR
ALBUMIN: 4.6 g/dL (ref 3.6–4.8)
ALT: 22 IU/L (ref 0–32)
AST: 16 IU/L (ref 0–40)
Albumin/Globulin Ratio: 2 (ref 1.2–2.2)
Alkaline Phosphatase: 54 IU/L (ref 39–117)
BUN / CREAT RATIO: 21 (ref 12–28)
BUN: 17 mg/dL (ref 8–27)
Bilirubin Total: 0.2 mg/dL (ref 0.0–1.2)
CO2: 18 mmol/L — AB (ref 20–29)
CREATININE: 0.82 mg/dL (ref 0.57–1.00)
Calcium: 10.2 mg/dL (ref 8.7–10.3)
Chloride: 102 mmol/L (ref 96–106)
GFR, EST AFRICAN AMERICAN: 86 mL/min/{1.73_m2} (ref >59)
GFR, EST NON AFRICAN AMERICAN: 74 mL/min/{1.73_m2} (ref >59)
GLUCOSE: 106 mg/dL — AB (ref 65–99)
Globulin, Total: 2.3 g/dL (ref 1.5–4.5)
Potassium: 4.4 mmol/L (ref 3.5–5.2)
Sodium: 140 mmol/L (ref 134–144)
TOTAL PROTEIN: 6.9 g/dL (ref 6.0–8.5)

## 2018-05-10 LAB — LIPID PANEL
CHOL/HDL RATIO: 3.2 ratio (ref 0.0–4.4)
Cholesterol, Total: 158 mg/dL (ref 100–199)
HDL: 50 mg/dL (ref >39)
LDL CALC: 84 mg/dL (ref 0–99)
Triglycerides: 118 mg/dL (ref 0–149)
VLDL CHOLESTEROL CAL: 24 mg/dL (ref 5–40)

## 2018-05-10 LAB — THYROID PANEL WITH TSH
FREE THYROXINE INDEX: 2.9 (ref 1.2–4.9)
T3 UPTAKE RATIO: 27 % (ref 24–39)
T4, Total: 10.6 ug/dL (ref 4.5–12.0)
TSH: 1.63 u[IU]/mL (ref 0.450–4.500)

## 2018-05-31 ENCOUNTER — Telehealth: Payer: Self-pay | Admitting: Nurse Practitioner

## 2018-05-31 NOTE — Telephone Encounter (Signed)
Pt is wanting to speak to MMM nurse states that insurance is no longer covering her amLODipine (NORVASC) 5 MG tablet.

## 2018-06-01 NOTE — Telephone Encounter (Signed)
I think this was supposed to come to you 

## 2018-06-01 NOTE — Telephone Encounter (Signed)
Patient has been taking one pill daily and not as prescribed for two daily.  Her blood pressure has been good.  Do you want to send in a new script with one pill daily?  She never noticed that script done in October was for two dily.

## 2018-06-01 NOTE — Telephone Encounter (Signed)
Please get someone to check on this- this is crazy

## 2018-06-02 NOTE — Telephone Encounter (Signed)
Attempted to contact patient - NA °

## 2018-06-02 NOTE — Telephone Encounter (Signed)
Was she changed from 10mg  to 5mg  at some point- that would be only reason she is suppose to take 2 a day. If she is doing well on 1 a day then just leave as is.

## 2018-06-06 ENCOUNTER — Encounter: Payer: Self-pay | Admitting: Nurse Practitioner

## 2018-06-06 ENCOUNTER — Ambulatory Visit (INDEPENDENT_AMBULATORY_CARE_PROVIDER_SITE_OTHER): Payer: Medicare HMO | Admitting: Nurse Practitioner

## 2018-06-06 VITALS — BP 128/60 | HR 98 | Temp 98.6°F | Resp 20 | Ht 61.0 in | Wt 186.4 lb

## 2018-06-06 DIAGNOSIS — K2970 Gastritis, unspecified, without bleeding: Secondary | ICD-10-CM | POA: Diagnosis not present

## 2018-06-06 DIAGNOSIS — J069 Acute upper respiratory infection, unspecified: Secondary | ICD-10-CM | POA: Diagnosis not present

## 2018-06-06 MED ORDER — HYDROCODONE-HOMATROPINE 5-1.5 MG/5ML PO SYRP: 5.0000 mL | ORAL_SOLUTION | Freq: Four times a day (QID) | ORAL | 0 refills | Status: DC | PRN

## 2018-06-06 MED ORDER — METHYLPREDNISOLONE ACETATE 80 MG/ML IJ SUSP
80.0000 mg | Freq: Once | INTRAMUSCULAR | Status: AC
  Administered 2018-06-06: 80 mg via INTRAMUSCULAR

## 2018-06-06 NOTE — Patient Instructions (Signed)

## 2018-06-06 NOTE — Progress Notes (Signed)
Subjective:    Patient ID: Karla Maxwell, female    DOB: August 31, 1950, 68 y.o.   MRN: [id number]   Chief Complaint: Abdominal Pain (upper abd radiating into back, hemoptysis started this AM)   HPI Patient come sin today c/o upper abdominal pain radiating into her back. Said she vomited in the night. Has not thrown up since 2am but has dry heaves. She started coughing up blood this morning and has been doing that all day. She has been coughing occasionally for several days but has gotten wore since last night.    Review of Systems  Constitutional: Negative for chills and fever ( ).  HENT: Positive for congestion and rhinorrhea. Negative for ear pain, sore throat and trouble swallowing.   Respiratory: Positive for cough (productive and is blood tinged) and shortness of breath.   Cardiovascular: Negative.   Gastrointestinal: Negative.   Musculoskeletal: Positive for myalgias.  Neurological: Negative.  Negative for headaches.  Psychiatric/Behavioral: Negative.   All other systems reviewed and are negative.      Objective:   Physical Exam Vitals signs and nursing note reviewed.  Constitutional:      General: She is not in acute distress.    Appearance: She is normal weight.  HENT:     Right Ear: Hearing, tympanic membrane, ear canal and external ear normal.     Left Ear: Hearing, tympanic membrane, ear canal and external ear normal.     Nose: Mucosal edema, congestion and rhinorrhea present.     Right Turbinates: Pale.     Left Turbinates: Pale.     Right Sinus: No maxillary sinus tenderness or frontal sinus tenderness.     Left Sinus: No maxillary sinus tenderness or frontal sinus tenderness.     Mouth/Throat:     Lips: Pink.     Mouth: Mucous membranes are moist.     Pharynx: No posterior oropharyngeal erythema.     Tonsils: No tonsillar exudate or tonsillar abscesses.  Neck:     Musculoskeletal: Normal range of motion.  Cardiovascular:     Rate and Rhythm: Normal rate  and regular rhythm.     Heart sounds: Normal heart sounds.  Pulmonary:     Effort: Pulmonary effort is normal.     Breath sounds: Normal breath sounds.     Comments: Productive wet cough Abdominal:     General: Abdomen is flat.  Skin:    General: Skin is warm.  Neurological:     General: No focal deficit present.     Mental Status: She is alert and oriented to person, place, and time.  Psychiatric:        Mood and Affect: Mood normal.        Behavior: Behavior normal.    BP 128/60   Pulse 98   Temp 98.6 F (37 C) (Oral)   Resp 20   Ht 5\' 1"  (1.549 m)   Wt 186 lb 6.4 oz (84.6 kg)   SpO2 95%   BMI 35.22 kg/m   Flu negative      Assessment & Plan:  Karla Maxwell in today with chief complaint of Abdominal Pain (upper abd radiating into back, hemoptysis started this AM)   1. Viral gastritis First 24 Hours-Clear liquids  popsicles  Jello  gatorade  Sprite Second 24 hours-Add Full liquids ( Liquids you cant see through) Third 24 hours- Bland diet ( foods that are baked or broiled)  *avoiding fried foods and highly spiced foods* During these  3 days  Avoid milk, cheese, ice cream or any other dairy products  Avoid caffeine- REMEMBER Mt. Dew and Mello Yellow contain lots of caffeine You should eat and drink in  Frequent small volumes If no improvement in symptoms or worsen in 2-3 days should RETRUN TO OFFICE or go to ER!     2. Upper respiratory infection with cough and congestion 1. Take meds as prescribed 2. Use a cool mist humidifier especially during the winter months and when heat has been humid. 3. Use saline nose sprays frequently 4. Saline irrigations of the nose can be very helpful if done frequently.  * 4X daily for 1 week*  * Use of a nettie pot can be helpful with this. Follow directions with this* 5. Drink plenty of fluids 6. Keep thermostat turn down low 7.For any cough or congestion  Use plain Mucinex- regular strength or max strength is fine   *  Children- consult with Pharmacist for dosing 8. For fever or aces or pains- take tylenol or ibuprofen appropriate for age and weight.  * for fevers greater than 101 orally you may alternate ibuprofen and tylenol every  3 hours.    - methylPREDNISolone acetate (DEPO-MEDROL) injection 80 mg - HYDROcodone-homatropine (HYCODAN) 5-1.5 MG/5ML syrup; Take 5 mLs by mouth every 6 (six) hours as needed for cough.  Dispense: 120 mL; Refill: 0  The above assessment and management plan was discussed with the patient. The patient verbalized understanding of and has agreed to the management plan. Patient is aware to call the clinic if symptoms persist or worsen. Patient is aware when to return to the clinic for a follow-up visit. Patient educated on when it is appropriate to go to the emergency department.   Mary-Margaret Hassell Done, FNP

## 2018-06-07 LAB — VERITOR FLU A/B WAIVED
INFLUENZA A: NEGATIVE
Influenza B: NEGATIVE

## 2018-06-07 NOTE — Telephone Encounter (Signed)
Aware.  Can continue to take amlodipine 5 mg daily  as long as blood pressure is good and she has plenty of medication..  Bring all medications to next visit.

## 2018-06-07 NOTE — Addendum Note (Signed)
Addended by: Chevis Pretty on: 06/07/2018 08:20 AM   Modules accepted: Orders

## 2018-06-08 ENCOUNTER — Emergency Department (HOSPITAL_COMMUNITY): Payer: Medicare HMO

## 2018-06-08 ENCOUNTER — Telehealth: Payer: Self-pay | Admitting: Nurse Practitioner

## 2018-06-08 ENCOUNTER — Encounter (HOSPITAL_COMMUNITY): Payer: Self-pay | Admitting: *Deleted

## 2018-06-08 ENCOUNTER — Inpatient Hospital Stay (HOSPITAL_COMMUNITY)
Admission: EM | Admit: 2018-06-08 | Discharge: 2018-06-11 | DRG: 871 | Disposition: A | Discharge status: Home / Self Care | Payer: Medicare HMO | Attending: Internal Medicine | Admitting: Internal Medicine

## 2018-06-08 DIAGNOSIS — E6609 Other obesity due to excess calories: Secondary | ICD-10-CM | POA: Diagnosis present

## 2018-06-08 DIAGNOSIS — J449 Chronic obstructive pulmonary disease, unspecified: Secondary | ICD-10-CM

## 2018-06-08 DIAGNOSIS — Z79890 Hormone replacement therapy (postmenopausal): Secondary | ICD-10-CM | POA: Diagnosis not present

## 2018-06-08 DIAGNOSIS — N179 Acute kidney failure, unspecified: Secondary | ICD-10-CM | POA: Diagnosis present

## 2018-06-08 DIAGNOSIS — Z713 Dietary counseling and surveillance: Secondary | ICD-10-CM

## 2018-06-08 DIAGNOSIS — R05 Cough: Secondary | ICD-10-CM | POA: Diagnosis not present

## 2018-06-08 DIAGNOSIS — J45901 Unspecified asthma with (acute) exacerbation: Secondary | ICD-10-CM

## 2018-06-08 DIAGNOSIS — M8580 Other specified disorders of bone density and structure, unspecified site: Secondary | ICD-10-CM | POA: Diagnosis present

## 2018-06-08 DIAGNOSIS — Z833 Family history of diabetes mellitus: Secondary | ICD-10-CM

## 2018-06-08 DIAGNOSIS — Z6833 Body mass index (BMI) 33.0-33.9, adult: Secondary | ICD-10-CM

## 2018-06-08 DIAGNOSIS — R112 Nausea with vomiting, unspecified: Secondary | ICD-10-CM | POA: Diagnosis not present

## 2018-06-08 DIAGNOSIS — J9601 Acute respiratory failure with hypoxia: Secondary | ICD-10-CM | POA: Diagnosis present

## 2018-06-08 DIAGNOSIS — J189 Pneumonia, unspecified organism: Secondary | ICD-10-CM | POA: Diagnosis not present

## 2018-06-08 DIAGNOSIS — J440 Chronic obstructive pulmonary disease with acute lower respiratory infection: Secondary | ICD-10-CM | POA: Diagnosis present

## 2018-06-08 DIAGNOSIS — Z66 Do not resuscitate: Secondary | ICD-10-CM | POA: Diagnosis present

## 2018-06-08 DIAGNOSIS — Z7951 Long term (current) use of inhaled steroids: Secondary | ICD-10-CM | POA: Diagnosis not present

## 2018-06-08 DIAGNOSIS — Z6835 Body mass index (BMI) 35.0-35.9, adult: Secondary | ICD-10-CM

## 2018-06-08 DIAGNOSIS — E1165 Type 2 diabetes mellitus with hyperglycemia: Secondary | ICD-10-CM | POA: Diagnosis present

## 2018-06-08 DIAGNOSIS — E869 Volume depletion, unspecified: Secondary | ICD-10-CM | POA: Diagnosis present

## 2018-06-08 DIAGNOSIS — F17210 Nicotine dependence, cigarettes, uncomplicated: Secondary | ICD-10-CM | POA: Diagnosis present

## 2018-06-08 DIAGNOSIS — Z720 Tobacco use: Secondary | ICD-10-CM | POA: Diagnosis not present

## 2018-06-08 DIAGNOSIS — J441 Chronic obstructive pulmonary disease with (acute) exacerbation: Secondary | ICD-10-CM | POA: Diagnosis present

## 2018-06-08 DIAGNOSIS — E11649 Type 2 diabetes mellitus with hypoglycemia without coma: Secondary | ICD-10-CM | POA: Diagnosis present

## 2018-06-08 DIAGNOSIS — E039 Hypothyroidism, unspecified: Secondary | ICD-10-CM | POA: Diagnosis not present

## 2018-06-08 DIAGNOSIS — J181 Lobar pneumonia, unspecified organism: Secondary | ICD-10-CM

## 2018-06-08 DIAGNOSIS — I10 Essential (primary) hypertension: Secondary | ICD-10-CM

## 2018-06-08 DIAGNOSIS — K219 Gastro-esophageal reflux disease without esophagitis: Secondary | ICD-10-CM | POA: Diagnosis present

## 2018-06-08 DIAGNOSIS — E785 Hyperlipidemia, unspecified: Secondary | ICD-10-CM | POA: Diagnosis present

## 2018-06-08 DIAGNOSIS — Z8249 Family history of ischemic heart disease and other diseases of the circulatory system: Secondary | ICD-10-CM

## 2018-06-08 DIAGNOSIS — J069 Acute upper respiratory infection, unspecified: Secondary | ICD-10-CM

## 2018-06-08 DIAGNOSIS — R197 Diarrhea, unspecified: Secondary | ICD-10-CM | POA: Diagnosis not present

## 2018-06-08 DIAGNOSIS — E871 Hypo-osmolality and hyponatremia: Secondary | ICD-10-CM | POA: Diagnosis not present

## 2018-06-08 DIAGNOSIS — Z7982 Long term (current) use of aspirin: Secondary | ICD-10-CM | POA: Diagnosis not present

## 2018-06-08 DIAGNOSIS — Z90710 Acquired absence of both cervix and uterus: Secondary | ICD-10-CM

## 2018-06-08 DIAGNOSIS — Z820 Family history of epilepsy and other diseases of the nervous system: Secondary | ICD-10-CM

## 2018-06-08 DIAGNOSIS — R0602 Shortness of breath: Secondary | ICD-10-CM

## 2018-06-08 DIAGNOSIS — Z6834 Body mass index (BMI) 34.0-34.9, adult: Secondary | ICD-10-CM

## 2018-06-08 DIAGNOSIS — R0902 Hypoxemia: Secondary | ICD-10-CM

## 2018-06-08 DIAGNOSIS — Z7984 Long term (current) use of oral hypoglycemic drugs: Secondary | ICD-10-CM | POA: Diagnosis not present

## 2018-06-08 DIAGNOSIS — Z825 Family history of asthma and other chronic lower respiratory diseases: Secondary | ICD-10-CM

## 2018-06-08 DIAGNOSIS — J4489 Other specified chronic obstructive pulmonary disease: Secondary | ICD-10-CM

## 2018-06-08 DIAGNOSIS — Z89209 Acquired absence of unspecified upper limb, unspecified level: Secondary | ICD-10-CM

## 2018-06-08 DIAGNOSIS — A419 Sepsis, unspecified organism: Secondary | ICD-10-CM | POA: Diagnosis not present

## 2018-06-08 DIAGNOSIS — E782 Mixed hyperlipidemia: Secondary | ICD-10-CM | POA: Diagnosis not present

## 2018-06-08 DIAGNOSIS — R6520 Severe sepsis without septic shock: Secondary | ICD-10-CM | POA: Diagnosis not present

## 2018-06-08 DIAGNOSIS — Z79899 Other long term (current) drug therapy: Secondary | ICD-10-CM

## 2018-06-08 LAB — CBC WITH DIFFERENTIAL/PLATELET
Abs Immature Granulocytes: 0.11 10*3/uL — ABNORMAL HIGH (ref 0.00–0.07)
Basophils Absolute: 0.1 10*3/uL (ref 0.0–0.1)
Basophils Relative: 1 %
EOS ABS: 3.5 10*3/uL — AB (ref 0.0–0.5)
Eosinophils Relative: 29 %
HCT: 36.2 % (ref 36.0–46.0)
Hemoglobin: 11.2 g/dL — ABNORMAL LOW (ref 12.0–15.0)
Immature Granulocytes: 1 %
LYMPHS ABS: 1.1 10*3/uL (ref 0.7–4.0)
Lymphocytes Relative: 10 %
MCH: 26.4 pg (ref 26.0–34.0)
MCHC: 30.9 g/dL (ref 30.0–36.0)
MCV: 85.4 fL (ref 80.0–100.0)
Monocytes Absolute: 0.4 10*3/uL (ref 0.1–1.0)
Monocytes Relative: 3 %
Neutro Abs: 6.8 10*3/uL (ref 1.7–7.7)
Neutrophils Relative %: 56 %
Platelets: 361 10*3/uL (ref 150–400)
RBC: 4.24 MIL/uL (ref 3.87–5.11)
RDW: 15.3 % (ref 11.5–15.5)
WBC MORPHOLOGY: INCREASED
WBC: 12 10*3/uL — ABNORMAL HIGH (ref 4.0–10.5)
nRBC: 0 % (ref 0.0–0.2)

## 2018-06-08 LAB — GROUP A STREP BY PCR: GROUP A STREP BY PCR: NOT DETECTED

## 2018-06-08 LAB — PROTIME-INR
INR: 1.22
Prothrombin Time: 15.3 seconds — ABNORMAL HIGH (ref 11.4–15.2)

## 2018-06-08 LAB — HEMOGLOBIN A1C
Hgb A1c MFr Bld: 6.6 % — ABNORMAL HIGH (ref 4.8–5.6)
Mean Plasma Glucose: 142.72 mg/dL

## 2018-06-08 LAB — LIPASE, BLOOD: Lipase: 20 U/L (ref 11–51)

## 2018-06-08 LAB — COMPREHENSIVE METABOLIC PANEL
ALK PHOS: 38 U/L (ref 38–126)
ALT: 15 U/L (ref 0–44)
ANION GAP: 11 (ref 5–15)
AST: 19 U/L (ref 15–41)
Albumin: 3.3 g/dL — ABNORMAL LOW (ref 3.5–5.0)
BUN: 30 mg/dL — ABNORMAL HIGH (ref 8–23)
CALCIUM: 8.5 mg/dL — AB (ref 8.9–10.3)
CO2: 20 mmol/L — ABNORMAL LOW (ref 22–32)
Chloride: 97 mmol/L — ABNORMAL LOW (ref 98–111)
Creatinine, Ser: 1.29 mg/dL — ABNORMAL HIGH (ref 0.44–1.00)
GFR calc Af Amer: 50 mL/min — ABNORMAL LOW (ref >60)
GFR calc non Af Amer: 43 mL/min — ABNORMAL LOW (ref >60)
Glucose, Bld: 187 mg/dL — ABNORMAL HIGH (ref 70–99)
Potassium: 3.4 mmol/L — ABNORMAL LOW (ref 3.5–5.1)
Sodium: 128 mmol/L — ABNORMAL LOW (ref 135–145)
Total Bilirubin: 0.9 mg/dL (ref 0.3–1.2)
Total Protein: 6.7 g/dL (ref 6.5–8.1)

## 2018-06-08 LAB — CBG MONITORING, ED: Glucose-Capillary: 191 mg/dL — ABNORMAL HIGH (ref 70–99)

## 2018-06-08 LAB — LACTIC ACID, PLASMA
Lactic Acid, Venous: 2.4 mmol/L (ref 0.5–1.9)
Lactic Acid, Venous: 5.1 mmol/L (ref 0.5–1.9)

## 2018-06-08 LAB — INFLUENZA PANEL BY PCR (TYPE A & B)
Influenza A By PCR: NEGATIVE
Influenza B By PCR: NEGATIVE

## 2018-06-08 LAB — PROCALCITONIN: Procalcitonin: 15.16 ng/mL

## 2018-06-08 LAB — TROPONIN I: Troponin I: <0.03 ng/mL (ref <0.03)

## 2018-06-08 LAB — APTT: aPTT: 37 seconds — ABNORMAL HIGH (ref 24–36)

## 2018-06-08 MED ORDER — SODIUM CHLORIDE 0.9 % IV BOLUS: 1500.0000 mL | Freq: Once | INTRAVENOUS | Status: DC

## 2018-06-08 MED ORDER — IPRATROPIUM BROMIDE 0.02 % IN SOLN
0.5000 mg | Freq: Four times a day (QID) | RESPIRATORY_TRACT | Status: DC
  Administered 2018-06-08 – 2018-06-11 (×12): 0.5 mg via RESPIRATORY_TRACT
  Filled 2018-06-08 (×12): qty 2.5

## 2018-06-08 MED ORDER — ALBUTEROL (5 MG/ML) CONTINUOUS INHALATION SOLN
10.0000 mg/h | INHALATION_SOLUTION | Freq: Once | RESPIRATORY_TRACT | Status: AC
  Administered 2018-06-08: 10 mg/h via RESPIRATORY_TRACT
  Filled 2018-06-08: qty 20

## 2018-06-08 MED ORDER — ACETAMINOPHEN 650 MG RE SUPP: 650.0000 mg | Freq: Four times a day (QID) | RECTAL | Status: DC | PRN

## 2018-06-08 MED ORDER — LEVALBUTEROL HCL 0.63 MG/3ML IN NEBU: 0.6300 mg | INHALATION_SOLUTION | RESPIRATORY_TRACT | Status: DC | PRN

## 2018-06-08 MED ORDER — LEVOTHYROXINE SODIUM 75 MCG PO TABS
75.0000 ug | ORAL_TABLET | Freq: Every day | ORAL | Status: DC
  Administered 2018-06-09 – 2018-06-11 (×3): 75 ug via ORAL
  Filled 2018-06-08 (×3): qty 1

## 2018-06-08 MED ORDER — MONTELUKAST SODIUM 10 MG PO TABS
10.0000 mg | ORAL_TABLET | Freq: Every day | ORAL | Status: DC
  Administered 2018-06-09 – 2018-06-11 (×3): 10 mg via ORAL
  Filled 2018-06-08 (×3): qty 1

## 2018-06-08 MED ORDER — INSULIN ASPART 100 UNIT/ML ~~LOC~~ SOLN
0.0000 [IU] | Freq: Three times a day (TID) | SUBCUTANEOUS | Status: DC
  Administered 2018-06-09: 1 [IU] via SUBCUTANEOUS
  Administered 2018-06-09: 2 [IU] via SUBCUTANEOUS
  Administered 2018-06-09: 3 [IU] via SUBCUTANEOUS
  Administered 2018-06-10: 1 [IU] via SUBCUTANEOUS
  Administered 2018-06-10: 3 [IU] via SUBCUTANEOUS
  Administered 2018-06-10 – 2018-06-11 (×2): 2 [IU] via SUBCUTANEOUS
  Administered 2018-06-11: 1 [IU] via SUBCUTANEOUS
  Administered 2018-06-11: 2 [IU] via SUBCUTANEOUS

## 2018-06-08 MED ORDER — METHYLPREDNISOLONE SODIUM SUCC 125 MG IJ SOLR
125.0000 mg | Freq: Once | INTRAMUSCULAR | Status: AC
  Administered 2018-06-08: 125 mg via INTRAVENOUS
  Filled 2018-06-08: qty 2

## 2018-06-08 MED ORDER — ENOXAPARIN SODIUM 40 MG/0.4ML ~~LOC~~ SOLN
40.0000 mg | SUBCUTANEOUS | Status: DC
  Administered 2018-06-08 – 2018-06-10 (×3): 40 mg via SUBCUTANEOUS
  Filled 2018-06-08 (×3): qty 0.4

## 2018-06-08 MED ORDER — IPRATROPIUM BROMIDE 0.02 % IN SOLN: 0.5000 mg | RESPIRATORY_TRACT | Status: DC | PRN

## 2018-06-08 MED ORDER — SODIUM CHLORIDE 0.9 % IV BOLUS
1000.0000 mL | Freq: Once | INTRAVENOUS | Status: AC
  Administered 2018-06-08: 1000 mL via INTRAVENOUS

## 2018-06-08 MED ORDER — ONDANSETRON HCL 4 MG PO TABS: 4.0000 mg | ORAL_TABLET | Freq: Four times a day (QID) | ORAL | Status: DC | PRN

## 2018-06-08 MED ORDER — BUDESONIDE 0.5 MG/2ML IN SUSP
0.5000 mg | Freq: Two times a day (BID) | RESPIRATORY_TRACT | Status: DC
  Administered 2018-06-08 – 2018-06-11 (×6): 0.5 mg via RESPIRATORY_TRACT
  Filled 2018-06-08 (×6): qty 2

## 2018-06-08 MED ORDER — LEVOFLOXACIN IN D5W 750 MG/150ML IV SOLN
750.0000 mg | INTRAVENOUS | Status: DC
  Administered 2018-06-08: 750 mg via INTRAVENOUS
  Filled 2018-06-08: qty 150

## 2018-06-08 MED ORDER — ATORVASTATIN CALCIUM 40 MG PO TABS
80.0000 mg | ORAL_TABLET | Freq: Every day | ORAL | Status: DC
  Administered 2018-06-09 – 2018-06-11 (×3): 80 mg via ORAL
  Filled 2018-06-08 (×3): qty 2

## 2018-06-08 MED ORDER — SODIUM CHLORIDE 0.9 % IV SOLN
500.0000 mg | INTRAVENOUS | Status: DC
  Administered 2018-06-08 – 2018-06-10 (×3): 500 mg via INTRAVENOUS
  Filled 2018-06-08 (×5): qty 500

## 2018-06-08 MED ORDER — PANTOPRAZOLE SODIUM 40 MG PO TBEC
80.0000 mg | DELAYED_RELEASE_TABLET | Freq: Every day | ORAL | Status: DC
  Administered 2018-06-09 – 2018-06-11 (×3): 80 mg via ORAL
  Filled 2018-06-08 (×3): qty 2

## 2018-06-08 MED ORDER — IPRATROPIUM BROMIDE 0.02 % IN SOLN
1.0000 mg | Freq: Once | RESPIRATORY_TRACT | Status: AC
  Administered 2018-06-08: 1 mg via RESPIRATORY_TRACT
  Filled 2018-06-08: qty 5

## 2018-06-08 MED ORDER — FENOFIBRATE 160 MG PO TABS
160.0000 mg | ORAL_TABLET | Freq: Every day | ORAL | Status: DC
  Administered 2018-06-09 – 2018-06-11 (×3): 160 mg via ORAL
  Filled 2018-06-08 (×8): qty 1

## 2018-06-08 MED ORDER — ONDANSETRON HCL 4 MG/2ML IJ SOLN: 4.0000 mg | Freq: Four times a day (QID) | INTRAMUSCULAR | Status: DC | PRN

## 2018-06-08 MED ORDER — SODIUM CHLORIDE 0.9 % IV SOLN
2.0000 g | INTRAVENOUS | Status: DC
  Administered 2018-06-08 – 2018-06-10 (×3): 2 g via INTRAVENOUS
  Filled 2018-06-08: qty 20
  Filled 2018-06-08: qty 2
  Filled 2018-06-08: qty 20
  Filled 2018-06-08: qty 2
  Filled 2018-06-08: qty 20

## 2018-06-08 MED ORDER — HYDROCODONE-HOMATROPINE 5-1.5 MG/5ML PO SYRP
5.0000 mL | ORAL_SOLUTION | Freq: Four times a day (QID) | ORAL | Status: DC | PRN
  Filled 2018-06-08: qty 5

## 2018-06-08 MED ORDER — SODIUM CHLORIDE 0.9 % IV BOLUS
500.0000 mL | Freq: Once | INTRAVENOUS | Status: AC
  Administered 2018-06-08: 500 mL via INTRAVENOUS

## 2018-06-08 MED ORDER — ASPIRIN EC 81 MG PO TBEC
162.0000 mg | DELAYED_RELEASE_TABLET | Freq: Every day | ORAL | Status: DC
  Administered 2018-06-08 – 2018-06-11 (×4): 162 mg via ORAL
  Filled 2018-06-08 (×3): qty 2

## 2018-06-08 MED ORDER — LEVALBUTEROL HCL 0.63 MG/3ML IN NEBU
0.6300 mg | INHALATION_SOLUTION | Freq: Four times a day (QID) | RESPIRATORY_TRACT | Status: DC
  Administered 2018-06-08 – 2018-06-11 (×12): 0.63 mg via RESPIRATORY_TRACT
  Filled 2018-06-08 (×12): qty 3

## 2018-06-08 MED ORDER — KETOTIFEN FUMARATE 0.025 % OP SOLN
2.0000 [drp] | Freq: Two times a day (BID) | OPHTHALMIC | Status: DC
  Administered 2018-06-09 – 2018-06-11 (×5): 2 [drp] via OPHTHALMIC
  Filled 2018-06-08 (×2): qty 5

## 2018-06-08 MED ORDER — ACETAMINOPHEN 325 MG PO TABS
650.0000 mg | ORAL_TABLET | Freq: Four times a day (QID) | ORAL | Status: DC | PRN
  Administered 2018-06-08 – 2018-06-11 (×3): 650 mg via ORAL
  Filled 2018-06-08 (×3): qty 2

## 2018-06-08 MED ORDER — POTASSIUM CHLORIDE IN NACL 20-0.9 MEQ/L-% IV SOLN
INTRAVENOUS | Status: DC
  Administered 2018-06-08 – 2018-06-11 (×4): via INTRAVENOUS
  Filled 2018-06-08: qty 1000

## 2018-06-08 MED ORDER — METHYLPREDNISOLONE SODIUM SUCC 125 MG IJ SOLR
60.0000 mg | Freq: Four times a day (QID) | INTRAMUSCULAR | Status: DC
  Administered 2018-06-09: 60 mg via INTRAVENOUS
  Administered 2018-06-09: 06:00:00 via INTRAVENOUS
  Administered 2018-06-09 – 2018-06-11 (×9): 60 mg via INTRAVENOUS
  Filled 2018-06-08 (×11): qty 2

## 2018-06-08 NOTE — Telephone Encounter (Signed)
Patient feeling weak, short of breath, coughing up blood.  Advised her she should go to the ER.

## 2018-06-08 NOTE — ED Notes (Signed)
Pt's breathing appears to be getting worse. Dr Thurnell Garbe notified bipap ordered for her rt called to bring it and put the patient on the bipap

## 2018-06-08 NOTE — ED Notes (Signed)
Pt states "I am hurting all over." RN notified.

## 2018-06-08 NOTE — ED Notes (Signed)
Date and time results received: 06/08/18 1335 (use smartphrase ".now" to insert current time)  Test: lactic acid Critical Value:2.4 Name of Provider Notified: [email or web address]  Orders Received? Or Actions Taken?: see chart

## 2018-06-08 NOTE — ED Triage Notes (Signed)
Pt with cough since Sunday that is productive and with blood per pt. 81% in triage.

## 2018-06-08 NOTE — Progress Notes (Signed)
Pt returned to BIPAP due to increased WOB and low O2 saturation

## 2018-06-08 NOTE — Progress Notes (Signed)
Pt taking off BIPAP and placed on 4L [place]. Tolerating fairly well
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
## 2018-06-08 NOTE — Progress Notes (Signed)
Patient currently off BIPAP and 90% on 5 lpm nasal cannula.

## 2018-06-09 ENCOUNTER — Other Ambulatory Visit: Payer: Self-pay

## 2018-06-09 ENCOUNTER — Inpatient Hospital Stay (HOSPITAL_COMMUNITY): Payer: Medicare HMO

## 2018-06-09 DIAGNOSIS — Z720 Tobacco use: Secondary | ICD-10-CM

## 2018-06-09 DIAGNOSIS — A419 Sepsis, unspecified organism: Principal | ICD-10-CM

## 2018-06-09 DIAGNOSIS — J45901 Unspecified asthma with (acute) exacerbation: Secondary | ICD-10-CM

## 2018-06-09 DIAGNOSIS — N179 Acute kidney failure, unspecified: Secondary | ICD-10-CM

## 2018-06-09 DIAGNOSIS — J9601 Acute respiratory failure with hypoxia: Secondary | ICD-10-CM

## 2018-06-09 DIAGNOSIS — J189 Pneumonia, unspecified organism: Secondary | ICD-10-CM

## 2018-06-09 DIAGNOSIS — E782 Mixed hyperlipidemia: Secondary | ICD-10-CM

## 2018-06-09 DIAGNOSIS — I10 Essential (primary) hypertension: Secondary | ICD-10-CM

## 2018-06-09 DIAGNOSIS — J441 Chronic obstructive pulmonary disease with (acute) exacerbation: Secondary | ICD-10-CM

## 2018-06-09 LAB — URINALYSIS, ROUTINE W REFLEX MICROSCOPIC
Bilirubin Urine: NEGATIVE
Glucose, UA: 50 mg/dL — AB
Ketones, ur: NEGATIVE mg/dL
Leukocytes, UA: NEGATIVE
Nitrite: NEGATIVE
Protein, ur: NEGATIVE mg/dL
SPECIFIC GRAVITY, URINE: 1.013 (ref 1.005–1.030)
pH: 6 (ref 5.0–8.0)

## 2018-06-09 LAB — GLUCOSE, CAPILLARY
Glucose-Capillary: 171 mg/dL — ABNORMAL HIGH (ref 70–99)
Glucose-Capillary: 203 mg/dL — ABNORMAL HIGH (ref 70–99)
Glucose-Capillary: 142 mg/dL — ABNORMAL HIGH (ref 70–99)
Glucose-Capillary: 204 mg/dL — ABNORMAL HIGH (ref 70–99)

## 2018-06-09 LAB — CBC
HCT: 27.3 % — ABNORMAL LOW (ref 36.0–46.0)
Hemoglobin: 8.6 g/dL — ABNORMAL LOW (ref 12.0–15.0)
MCH: 26.8 pg (ref 26.0–34.0)
MCHC: 31.5 g/dL (ref 30.0–36.0)
MCV: 85 fL (ref 80.0–100.0)
Platelets: 350 10*3/uL (ref 150–400)
RBC: 3.21 MIL/uL — ABNORMAL LOW (ref 3.87–5.11)
RDW: 15.4 % (ref 11.5–15.5)
WBC: 15.1 10*3/uL — ABNORMAL HIGH (ref 4.0–10.5)
nRBC: 0 % (ref 0.0–0.2)

## 2018-06-09 LAB — BASIC METABOLIC PANEL
Anion gap: 7 (ref 5–15)
BUN: 21 mg/dL (ref 8–23)
CO2: 17 mmol/L — ABNORMAL LOW (ref 22–32)
Calcium: 7.5 mg/dL — ABNORMAL LOW (ref 8.9–10.3)
Chloride: 111 mmol/L (ref 98–111)
Creatinine, Ser: 0.81 mg/dL (ref 0.44–1.00)
GFR calc Af Amer: >60 mL/min (ref >60)
GFR calc non Af Amer: >60 mL/min (ref >60)
Glucose, Bld: 186 mg/dL — ABNORMAL HIGH (ref 70–99)
Potassium: 3.6 mmol/L (ref 3.5–5.1)
SODIUM: 135 mmol/L (ref 135–145)

## 2018-06-09 LAB — MAGNESIUM: Magnesium: 1.1 mg/dL — ABNORMAL LOW (ref 1.7–2.4)

## 2018-06-09 LAB — MRSA PCR SCREENING: MRSA BY PCR: NEGATIVE

## 2018-06-09 LAB — HIV ANTIBODY (ROUTINE TESTING W REFLEX): HIV Screen 4th Generation wRfx: NONREACTIVE

## 2018-06-09 MED ORDER — ORAL CARE MOUTH RINSE
15.0000 mL | Freq: Two times a day (BID) | OROMUCOSAL | Status: DC
  Administered 2018-06-09 – 2018-06-11 (×2): 15 mL via OROMUCOSAL

## 2018-06-09 MED ORDER — ARFORMOTEROL TARTRATE 15 MCG/2ML IN NEBU
15.0000 ug | INHALATION_SOLUTION | Freq: Two times a day (BID) | RESPIRATORY_TRACT | Status: DC
  Administered 2018-06-09 – 2018-06-11 (×5): 15 ug via RESPIRATORY_TRACT
  Filled 2018-06-09 (×5): qty 2

## 2018-06-09 NOTE — Progress Notes (Signed)
PROGRESS NOTE    Karla Maxwell  KNL:[id number] DOB: [birth year] DOA: 06/08/2018 PCP: Chevis Pretty, FNP     Brief Narrative:  68 y.o. female with medical history of COPD, hypertension, hyperlipidemia, hypothyroidism, tobacco abuse, and GERD presenting with 3-day history of coughing, shortness of breath with remittent nausea and vomiting.  The patient began having symptoms on 06/05/2018 when she began developing a nonproductive cough with sore throat and chest congestion.  She stated that she was coughing up some yellow sputum that was occasionally blood-tinged.  She had some subjective fevers and chills, but denied any headache, neck pain, diarrhea.  The patient began having intermittent nausea and vomiting on 06/07/2018.  There is no hematemesis.  Patient continues to smoke 1 pack/day x 25 years.  She went to see her primary care provider on 06/06/2018.  The patient was given a prescription for Hycodan.  Unfortunately, her symptoms of shortness of breath and coughing continue to worsen.  As result, she presented emergency department for further evaluation. In the ED, the patient was afebrile with soft blood pressure 100-110.  The patient was noted to have oxygen saturation of 81% on room air.  The patient had increased work of breathing requiring placement on BiPAP.  On BiPAP, the patient remained somewhat tachypneic but gradually improved with oxygen saturation 99% on FiO2 60%.  Chest x-ray showed bibasilar opacities, right greater than left suggesting multifocal pneumonia.    Assessment & Plan: 1-sepsis, in the setting of community-acquired pneumonia -Continue IV antibiotics -Continue IV fluids -Influenza PCR negative -Follow clinical response and continue supportive care. -Patient improving and sepsis criteria resolving appropriately -WBCs are slightly elevated; patient receiving steroids.  She is afebrile.   2-acute respiratory failure with hypoxia -Appears to be secondary to COPD  exacerbation in the setting of community-acquired pneumonia -Patient required to be placed on BiPAP overnight 60% FiO2 supplementation on admission. -She has been stable and able to be weaned off BiPAP and at this time receiving 4-6 L nasal cannula supplementation -Continue antibiotics, steroids and nebulizer management -Continue to wean oxygen as tolerated -Follow clinical response. -Will continue the use of flutter valve and Mucinex. -start [place]  3-acute kidney injury -In the setting of sepsis and volume depletion -Will continue fluid resuscitation -Creatinine has trended down essentially is back to normal range. -Follow-up trend intermittently.  4-hyperlipidemia -Continue statins and fenofibrate.  5-hypothyroidism  -continue Synthroid  6-hypertension -Blood pressure was soft on admission -Currently stable -Continue holding antihypertensive agents for now -Follow vital signs.  7-type 2 diabetes -A1c 6.6 -Modified carbohydrate diet has been discussed with patient -At discharge will initiate treatment with oral hypoglycemic agents.  8-tobacco abuse -extensive cessation counseling provided. -patient decline use of nicotine patch.  DVT prophylaxis: Lovenox Code Status: DNR Family Communication: Husband at bedside. Disposition Plan: Remains inpatient, continue IV steroids, nebulizer management, IV antibiotics, flutter valve and oxygen supplementation.  At this time patient will be transfer to [place] will continue treatment for her COPD exacerbation.  Consultants:   None  Procedures:   See below for x-ray reports.  Antimicrobials:  Anti-infectives (From admission, onward)   Start     Dose/Rate Route Frequency Ordered Stop   06/08/18 1800  cefTRIAXone (ROCEPHIN) 2 g in sodium chloride 0.9 % 100 mL IVPB     2 g 200 mL/hr over 30 Minutes Intravenous Every 24 hours 06/08/18 1755     06/08/18 1800  azithromycin (ZITHROMAX) 500 mg in sodium chloride 0.9 % 250 mL IVPB  500 mg 250 mL/hr over 60 Minutes Intravenous Every 24 hours 06/08/18 1755     06/08/18 1445  levofloxacin (LEVAQUIN) IVPB 750 mg  Status:  Discontinued     750 mg 100 mL/hr over 90 Minutes Intravenous Every 24 hours 06/08/18 1436 06/09/18 0938       Subjective: Having difficulty speaking in full sentences, afebrile, denies nausea, no vomiting, no abdominal pain.  Patient has been off BiPAP for over 16 hours with a stable oxygen supplementation on nasal cannula (4-6 L).  Objective: Vitals:   06/09/18 0841 06/09/18 0850 06/09/18 0902 06/09/18 1148  BP:      Pulse:    (!) 120  Resp:    (!) 31  Temp:    99.4 F (37.4 C)  TempSrc:    Oral  SpO2: 92% 96% 97% (!) 89%  Weight:      Height:        Intake/Output Summary (Last 24 hours) at 06/09/2018 1403 Last data filed at 06/09/2018 0653 Gross per 24 hour  Intake 4450.09 ml  Output -  Net 4450.09 ml   Filed Weights   06/08/18 1226  Weight: 83.3 kg    Examination: General exam: Alert, awake, oriented x 3; requiring 4-6 L oxygen supplementation.  Short of breath with minimal exertion and having difficulty speaking in full sentences.  Patient is afebrile and denies chest pain. Respiratory system: Positive rhonchi, diffuse expiratory wheezing, no using accessory muscles; positive tachypnea.   Cardiovascular system:RRR. No murmurs, rubs, gallops. Gastrointestinal system: Abdomen is nondistended, soft and nontender. No organomegaly or masses felt. Normal bowel sounds heard. Central nervous system: Alert and oriented. No focal neurological deficits. Extremities: No cyanosis or clubbing, status post right arm amputation; no edema appreciated. Skin: No rashes, lesions or petechiae. Psychiatry: Judgement and insight appear normal. Mood & affect appropriate.   Data Reviewed: I have personally reviewed following labs and imaging studies  CBC: Recent Labs  Lab 06/08/18 1300 06/09/18 0413  WBC 12.0* 15.1*  NEUTROABS 6.8  --   HGB  11.2* 8.6*  HCT 36.2 27.3*  MCV 85.4 85.0  PLT 361 841   Basic Metabolic Panel: Recent Labs  Lab 06/08/18 1300 06/09/18 0413  NA 128* 135  K 3.4* 3.6  CL 97* 111  CO2 20* 17*  GLUCOSE 187* 186*  BUN 30* 21  CREATININE 1.29* 0.81  CALCIUM 8.5* 7.5*  MG  --  1.1*   GFR: Estimated Creatinine Clearance: 66.7 mL/min (by C-G formula based on SCr of 0.81 mg/dL).   Liver Function Tests: Recent Labs  Lab 06/08/18 1300  AST 19  ALT 15  ALKPHOS 38  BILITOT 0.9  PROT 6.7  ALBUMIN 3.3*   Recent Labs  Lab 06/08/18 1300  LIPASE 20   Coagulation Profile: Recent Labs  Lab 06/08/18 1807  INR 1.22   Cardiac Enzymes: Recent Labs  Lab 06/08/18 1300  TROPONINI <0.03   HbA1C: Recent Labs    06/08/18 1807  HGBA1C 6.6*   CBG: Recent Labs  Lab 06/08/18 2156 06/09/18 0731 06/09/18 1146  GLUCAP 191* 171* 203*    Recent Results (from the past 240 hour(s))  Group A Strep by PCR     Status: None   Collection Time: 06/08/18  1:00 PM  Result Value Ref Range Status   Group A Strep by PCR NOT DETECTED NOT DETECTED Final    Comment: Performed at [facility], [address] [place], [place] [address]  Culture, blood (routine x 2) Call  MD if unable to obtain prior to antibiotics being given     Status: None (Preliminary result)   Collection Time: 06/08/18  2:43 PM  Result Value Ref Range Status   Specimen Description BLOOD LEFT FOREARM  Final   Special Requests   Final    BOTTLES DRAWN AEROBIC AND ANAEROBIC Blood Culture adequate volume   Culture   Final    NO GROWTH < 24 HOURS Performed at [facility], [address] [place], [place] [address]    Report Status PENDING  Incomplete  MRSA PCR Screening     Status: None   Collection Time: 06/09/18  1:10 AM  Result Value Ref Range Status   MRSA by PCR NEGATIVE NEGATIVE Final    Comment:        The GeneXpert MRSA Assay (FDA approved for NASAL specimens only), is one component of a comprehensive MRSA  colonization surveillance program. It is not intended to diagnose MRSA infection nor to guide or monitor treatment for MRSA infections. Performed at [facility], [address] [place], [place] [address]      Radiology Studies: Dg Chest 2 View  Result Date: 06/08/2018 CLINICAL DATA:  Cough. EXAM: CHEST - 2 VIEW COMPARISON:  February 25, 2017 FINDINGS: New right basilar infiltrate. New left perihilar infiltrate. Heart, hila, mediastinum, lungs, and pleura are otherwise unchanged. IMPRESSION: Bilateral infiltrates consistent multifocal pneumonia. Recommend follow-up to resolution. Electronically Signed   By: Dorise Bullion III M.D   On: 06/08/2018 14:24   Dg Chest Port 1 View  Result Date: 06/09/2018 CLINICAL DATA:  Productive cough.  Sepsis. EXAM: PORTABLE CHEST 1 VIEW COMPARISON:  Yesterday FINDINGS: Unchanged right more than left pneumonia without evident effusion or cavitation. Stable heart size. Generalized interstitial coarsening likely related to patient's history of COPD. IMPRESSION: Unchanged bilateral pneumonia. Electronically Signed   By: Monte Fantasia M.D.   On: 06/09/2018 05:36   Dg Abd 2 Views  Result Date: 06/08/2018 CLINICAL DATA:  Nausea and vomiting. EXAM: ABDOMEN - 2 VIEW COMPARISON:  None. FINDINGS: Bibasilar infiltrates. No free air, portal venous gas, or pneumatosis. The bowel gas pattern is nonobstructive. No other acute abnormalities. IMPRESSION: No bowel obstruction identified. No free air noted on upright imaging. Bibasilar pneumonia. Electronically Signed   By: Dorise Bullion III M.D   On: 06/08/2018 14:24    Scheduled Meds: . arformoterol  15 mcg Nebulization BID  . aspirin EC  162 mg Oral Daily  . atorvastatin  80 mg Oral Daily  . budesonide (PULMICORT) nebulizer solution  0.5 mg Nebulization BID  . enoxaparin (LOVENOX) injection  40 mg Subcutaneous Q24H  . fenofibrate  160 mg Oral Daily  . insulin aspart  0-9 Units Subcutaneous TID WC  . ipratropium  0.5  mg Nebulization Q6H  . ketotifen  2 drop Both Eyes BID  . levalbuterol  0.63 mg Nebulization Q6H  . levothyroxine  75 mcg Oral QAC breakfast  . mouth rinse  15 mL Mouth Rinse BID  . methylPREDNISolone (SOLU-MEDROL) injection  60 mg Intravenous Q6H  . montelukast  10 mg Oral Q breakfast  . pantoprazole  80 mg Oral Daily   Continuous Infusions: . 0.9 % NaCl with KCl 20 mEq / L 125 mL/hr at 06/09/18 0430  . azithromycin Stopped (06/08/18 2013)  . cefTRIAXone (ROCEPHIN)  IV Stopped (06/08/18 1909)     LOS: 1 day    Time spent: 35 minutes. Greater than 50% of this time was spent in direct contact with  the patient, coordinating care and discussing relevant ongoing clinical issues, including acute CAP, COPD exacerbation, need for oxygen supplementation and extensive smoking cessation counseling. Husband updated at bedside.    Barton Dubois, MD [facility] Pager [phone]  If 7PM-7AM, please contact night-coverage [email or web address] Password [email or web address] 06/09/2018, 2:03 PM

## 2018-06-09 NOTE — Progress Notes (Signed)
Patient is currently on [facility] with sats of 94% and is resting comfortably. BIPAP in room on standby but is not needed at this time. Will continue to monitor.

## 2018-06-09 NOTE — ED Notes (Signed)
ED TO INPATIENT HANDOFF REPORT  Name/Age/Gender Karla Maxwell 68 y.o. female  Code Status    Code Status Orders  (From admission, onward)         Start     Ordered   06/08/18 1756  Do not attempt resuscitation (DNR)  Continuous    Question Answer Comment  In the event of cardiac or respiratory ARREST Do not call a "code blue"   In the event of cardiac or respiratory ARREST Do not perform Intubation, CPR, defibrillation or ACLS   In the event of cardiac or respiratory ARREST Use medication by any route, position, wound care, and other measures to relive pain and suffering. May use oxygen, suction and manual treatment of airway obstruction as needed for comfort.      06/08/18 1755        Code Status History    This patient has a current code status but no historical code status.      Home/SNF/Other Home  Chief Complaint Cough  Level of Care/Admitting Diagnosis ED Disposition    ED Disposition Condition [facility]: [facility] [[id number]]  Level of Care: Stepdown [14]  Diagnosis: Acute respiratory failure with hypoxia [facility]) [[id number]]  Admitting Physician: TAT, DAVID [[id number]]  Attending Physician: TAT, DAVID [[id number]]  Estimated length of stay: past midnight tomorrow  Certification:: I certify this patient will need inpatient services for at least 2 midnights  PT Class (Do Not Modify): Inpatient [101]  PT Acc Code (Do Not Modify): Private [1]       Medical History Past Medical History:  Diagnosis Date  . Asthma   . Breast cyst    left  . GERD (gastroesophageal reflux disease)   . Hyperlipidemia   . Hypertension   . Osteopenia   . Shingles   . Thyroid disease     Allergies Allergies  Allergen Reactions  . Sulfa Antibiotics Rash    IV Location/Drains/Wounds Patient Lines/Drains/Airways Status   Active Line/Drains/Airways    Name:   Placement date:   Placement time:   Site:   Days:   Peripheral IV 06/08/18 Left Antecubital    06/08/18    1305    Antecubital   1   External Urinary Catheter   06/08/18    1612    -   1          Labs/Imaging Results for orders placed or performed during the hospital encounter of 06/08/18 (from the past 48 hour(s))  Influenza panel by PCR (type A & B)     Status: None   Collection Time: 06/08/18  1:00 PM  Result Value Ref Range   Influenza A By PCR NEGATIVE NEGATIVE   Influenza B By PCR NEGATIVE NEGATIVE    Comment: (NOTE) The Xpert Xpress Flu assay is intended as an aid in the diagnosis of  influenza and should not be used as a sole basis for treatment.  This  assay is FDA approved for nasopharyngeal swab specimens only. Nasal  washings and aspirates are unacceptable for Xpert Xpress Flu testing. Performed at [facility], [address] [place], [place] [address]   Comprehensive metabolic panel     Status: Abnormal   Collection Time: 06/08/18  1:00 PM  Result Value Ref Range   Sodium 128 (L) 135 - 145 mmol/L   Potassium 3.4 (L) 3.5 - 5.1 mmol/L   Chloride 97 (L) 98 - 111 mmol/L   CO2 20 (L) 22 - 32  mmol/L   Glucose, Bld 187 (H) 70 - 99 mg/dL   BUN 30 (H) 8 - 23 mg/dL   Creatinine, Ser 1.29 (H) 0.44 - 1.00 mg/dL   Calcium 8.5 (L) 8.9 - 10.3 mg/dL   Total Protein 6.7 6.5 - 8.1 g/dL   Albumin 3.3 (L) 3.5 - 5.0 g/dL   AST 19 15 - 41 U/L   ALT 15 0 - 44 U/L   Alkaline Phosphatase 38 38 - 126 U/L   Total Bilirubin 0.9 0.3 - 1.2 mg/dL   GFR calc non Af Amer 43 (L) >60 mL/min   GFR calc Af Amer 50 (L) >60 mL/min   Anion gap 11 5 - 15    Comment: Performed at [facility], [address], [place], [place] [address]  Lipase, blood     Status: None   Collection Time: 06/08/18  1:00 PM  Result Value Ref Range   Lipase 20 11 - 51 U/L    Comment: Performed at [facility] [address] [place], [place] [address]  Troponin I - Once     Status: None   Collection Time: 06/08/18  1:00 PM  Result Value Ref Range   Troponin I <0.03 <0.03 ng/mL    Comment: Performed at [facility], [address] [place], [place] [address]  Lactic acid, plasma     Status: Abnormal   Collection Time: 06/08/18  1:00 PM  Result Value Ref Range   Lactic Acid, Venous 2.4 (HH) 0.5 - 1.9 mmol/L    Comment: CRITICAL RESULT CALLED TO, READ BACK BY AND VERIFIED WITH: Caryl Asp AT 1335 ON 2.5.20 BY ISLEY,B Performed at [facility], [address] [place], [place] [address]   CBC with Differential     Status: Abnormal   Collection Time: 06/08/18  1:00 PM  Result Value Ref Range   WBC 12.0 (H) 4.0 - 10.5 K/uL   RBC 4.24 3.87 - 5.11 MIL/uL   Hemoglobin 11.2 (L) 12.0 - 15.0 g/dL   HCT 36.2 36.0 - 46.0 %   MCV 85.4 80.0 - 100.0 fL   MCH 26.4 26.0 - 34.0 pg   MCHC 30.9 30.0 - 36.0 g/dL   RDW 15.3 11.5 - 15.5 %   Platelets 361 150 - 400 K/uL   nRBC 0.0 0.0 - 0.2 %   Neutrophils Relative % 56 %   Neutro Abs 6.8 1.7 - 7.7 K/uL   Lymphocytes Relative 10 %   Lymphs Abs 1.1 0.7 - 4.0 K/uL   Monocytes Relative 3 %   Monocytes Absolute 0.4 0.1 - 1.0 K/uL   Eosinophils Relative 29 %   Eosinophils Absolute 3.5 (H) 0.0 - 0.5 K/uL   Basophils Relative 1 %   Basophils Absolute 0.1 0.0 - 0.1 K/uL   WBC Morphology INCREASED BANDS (>20% BANDS)     Comment: VACUOLATED NEUTROPHILS   Immature Granulocytes 1 %   Abs Immature Granulocytes 0.11 (H) 0.00 - 0.07 K/uL    Comment: Performed at [facility], [address] [place], [place] [address]  Group A Strep by PCR     Status: None   Collection Time: 06/08/18  1:00 PM  Result Value Ref Range   Group A Strep by PCR NOT DETECTED NOT DETECTED    Comment: Performed at [facility], [address] [place], [place] [address]  Lactic acid, plasma     Status: Abnormal   Collection Time: 06/08/18  2:43 PM  Result Value Ref Range   Lactic  Acid, Venous 5.1 (HH) 0.5 - 1.9 mmol/L    Comment: CRITICAL RESULT CALLED TO, READ BACK BY AND VERIFIED WITH: Caryl Asp AT 1545 ON 2.5.20 BY ISLEY,B Performed at [facility], [address], [place], [place]  [address] CORRECTED ON 02/05 AT 7793: PREVIOUSLY REPORTED AS 5.1 CRITICAL RESULT CALLED TO, READ BACK BY AND VERIFIED WITH: [place],C ON 06/08/18 AT 1530 BY LOY,C   Culture, blood (routine x 2) Call MD if unable to obtain prior to antibiotics being given     Status: None (Preliminary result)   Collection Time: 06/08/18  2:43 PM  Result Value Ref Range   Specimen Description BLOOD LEFT FOREARM    Special Requests      BOTTLES DRAWN AEROBIC AND ANAEROBIC Blood Culture adequate volume Performed at [facility], [address], [place], [place] [address]    Culture PENDING    Report Status PENDING   Procalcitonin     Status: None   Collection Time: 06/08/18  6:07 PM  Result Value Ref Range   Procalcitonin 15.16 ng/mL    Comment:        Interpretation: PCT >= 10 ng/mL: Important systemic inflammatory response, almost exclusively due to severe bacterial sepsis or septic shock. (NOTE)       Sepsis PCT Algorithm           Lower Respiratory Tract                                      Infection PCT Algorithm    ----------------------------     ----------------------------         PCT < 0.25 ng/mL                PCT < 0.10 ng/mL         Strongly encourage             Strongly discourage   discontinuation of antibiotics    initiation of antibiotics    ----------------------------     -----------------------------       PCT 0.25 - 0.50 ng/mL            PCT 0.10 - 0.25 ng/mL               OR       >80% decrease in PCT            Discourage initiation of                                            antibiotics      Encourage discontinuation           of antibiotics    ----------------------------     -----------------------------         PCT >= 0.50 ng/mL              PCT 0.26 - 0.50 ng/mL                AND       <80% decrease in PCT             Encourage initiation of  antibiotics       Encourage continuation           of antibiotics     ----------------------------     -----------------------------        PCT >= 0.50 ng/mL                  PCT > 0.50 ng/mL               AND         increase in PCT                  Strongly encourage                                      initiation of antibiotics    Strongly encourage escalation           of antibiotics                                     -----------------------------                                           PCT <= 0.25 ng/mL                                                 OR                                        > 80% decrease in PCT                                     Discontinue / Do not initiate                                             antibiotics Performed at [facility], [address], [place], [place] [address]   Protime-INR     Status: Abnormal   Collection Time: 06/08/18  6:07 PM  Result Value Ref Range   Prothrombin Time 15.3 (H) 11.4 - 15.2 seconds   INR 1.22     Comment: Performed at [facility], [address] [place], [place] [address]  APTT     Status: Abnormal   Collection Time: 06/08/18  6:07 PM  Result Value Ref Range   aPTT 37 (H) 24 - 36 seconds    Comment:        IF BASELINE aPTT IS ELEVATED, SUGGEST PATIENT RISK ASSESSMENT BE USED TO DETERMINE APPROPRIATE ANTICOAGULANT THERAPY. Performed at [facility], [address], [place], [place] [address]   Hemoglobin A1c     Status: Abnormal   Collection Time: 06/08/18  6:07 PM  Result Value Ref Range   Hgb A1c MFr Bld 6.6 (H) 4.8 - 5.6 %    Comment: (NOTE) Pre  diabetes:          5.7%-6.4% Diabetes:              >6.4% Glycemic control for   <7.0% adults with diabetes    Mean Plasma Glucose 142.72 mg/dL    Comment: Performed at [facility] [address] [place],  [address]  CBG monitoring, ED     Status: Abnormal   Collection Time: 06/08/18  9:56 PM  Result Value Ref Range   Glucose-Capillary 191 (H) 70 - 99 mg/dL   Dg Chest 2 View  Result Date: 06/08/2018 CLINICAL  DATA:  Cough. EXAM: CHEST - 2 VIEW COMPARISON:  February 25, 2017 FINDINGS: New right basilar infiltrate. New left perihilar infiltrate. Heart, hila, mediastinum, lungs, and pleura are otherwise unchanged. IMPRESSION: Bilateral infiltrates consistent multifocal pneumonia. Recommend follow-up to resolution. Electronically Signed   By: Dorise Bullion III M.D   On: 06/08/2018 14:24   Dg Abd 2 Views  Result Date: 06/08/2018 CLINICAL DATA:  Nausea and vomiting. EXAM: ABDOMEN - 2 VIEW COMPARISON:  None. FINDINGS: Bibasilar infiltrates. No free air, portal venous gas, or pneumatosis. The bowel gas pattern is nonobstructive. No other acute abnormalities. IMPRESSION: No bowel obstruction identified. No free air noted on upright imaging. Bibasilar pneumonia. Electronically Signed   By: Dorise Bullion III M.D   On: 06/08/2018 14:24    Pending Labs Unresulted Labs (From admission, onward)    Start     Ordered   06/09/18 7026  Basic metabolic panel  Tomorrow morning,   R     06/08/18 1755   06/09/18 0500  CBC  Tomorrow morning,   R     06/08/18 1755   06/09/18 0500  Magnesium  Tomorrow morning,   R     06/08/18 1755   06/08/18 1756  HIV antibody (Routine Testing)  Once,   R     06/08/18 1755   06/08/18 1756  Legionella Pneumophila Serogp 1 Ur Ag  Once,   R     06/08/18 1755   06/08/18 1756  Strep pneumoniae urinary antigen  Once,   R     06/08/18 1755   06/08/18 1437  Strep pneumoniae urinary antigen  Once,   R     06/08/18 1436   06/08/18 1241  Urinalysis, Routine w reflex microscopic  ONCE - STAT,   STAT     06/08/18 1241   06/08/18 1241  Urine culture  ONCE - STAT,   STAT     06/08/18 1241          Vitals/Pain Today's Vitals   06/09/18 0012 06/09/18 0013 06/09/18 0016 06/09/18 0030  BP:  (!) 112/47  (!) 109/47  Pulse:  (!) 102 (!) 108 (!) 107  Resp:  (!) 34 (!) 23 (!) 26  Temp:      TempSrc:      SpO2:  90% 98% 98%  Weight:      Height:      PainSc: 8        Isolation  Precautions Droplet precaution  Medications Medications  levofloxacin (LEVAQUIN) IVPB 750 mg (0 mg Intravenous Stopped 06/08/18 1754)  0.9 % NaCl with KCl 20 mEq/ L  infusion ( Intravenous New Bag/Given 06/08/18 1820)  aspirin EC tablet 162 mg (162 mg Oral Given 06/08/18 1820)  atorvastatin (LIPITOR) tablet 80 mg (80 mg Oral Not Given 06/08/18 1821)  fenofibrate tablet 160 mg (160 mg Oral Not Given 06/08/18 1821)  levothyroxine (SYNTHROID, LEVOTHROID)  tablet 75 mcg (has no administration in time range)  pantoprazole (PROTONIX) EC tablet 80 mg (80 mg Oral Not Given 06/08/18 1821)  montelukast (SINGULAIR) tablet 10 mg (has no administration in time range)  HYDROcodone-homatropine (HYCODAN) 5-1.5 MG/5ML syrup 5 mL (has no administration in time range)  ketotifen (ZADITOR) 0.025 % ophthalmic solution 2 drop (2 drops Both Eyes Not Given 06/08/18 2150)  enoxaparin (LOVENOX) injection 40 mg (40 mg Subcutaneous Given 06/08/18 1820)  acetaminophen (TYLENOL) tablet 650 mg (650 mg Oral Given 06/08/18 2340)    Or  acetaminophen (TYLENOL) suppository 650 mg ( Rectal See Alternative 06/08/18 2340)  ondansetron (ZOFRAN) tablet 4 mg (has no administration in time range)    Or  ondansetron (ZOFRAN) injection 4 mg (has no administration in time range)  cefTRIAXone (ROCEPHIN) 2 g in sodium chloride 0.9 % 100 mL IVPB (0 g Intravenous Stopped 06/08/18 1909)  azithromycin (ZITHROMAX) 500 mg in sodium chloride 0.9 % 250 mL IVPB (0 mg Intravenous Stopped 06/08/18 2026)  methylPREDNISolone sodium succinate (SOLU-MEDROL) 125 mg/2 mL injection 60 mg (60 mg Intravenous Given 06/09/18 0007)  budesonide (PULMICORT) nebulizer solution 0.5 mg (0.5 mg Nebulization Given 06/08/18 1923)  levalbuterol (XOPENEX) nebulizer solution 0.63 mg (0.63 mg Nebulization Given 06/08/18 1924)  ipratropium (ATROVENT) nebulizer solution 0.5 mg (0.5 mg Nebulization Given 06/08/18 1924)  levalbuterol (XOPENEX) nebulizer solution 0.63 mg (has no administration in time  range)  ipratropium (ATROVENT) nebulizer solution 0.5 mg (has no administration in time range)  insulin aspart (novoLOG) injection 0-9 Units (has no administration in time range)  sodium chloride 0.9 % bolus 1,000 mL (0 mLs Intravenous Stopped 06/08/18 1457)  albuterol (PROVENTIL,VENTOLIN) solution continuous neb (10 mg/hr Nebulization Given 06/08/18 1249)  ipratropium (ATROVENT) nebulizer solution 1 mg (1 mg Nebulization Given 06/08/18 1249)  sodium chloride 0.9 % bolus 500 mL (0 mLs Intravenous Stopped 06/08/18 1544)  methylPREDNISolone sodium succinate (SOLU-MEDROL) 125 mg/2 mL injection 125 mg (125 mg Intravenous Given 06/08/18 1614)  sodium chloride 0.9 % bolus 1,000 mL (0 mLs Intravenous Stopped 06/08/18 1754)  sodium chloride 0.9 % bolus 1,000 mL (0 mLs Intravenous Stopped 06/08/18 2104)    Mobility walks

## 2018-06-10 DIAGNOSIS — J181 Lobar pneumonia, unspecified organism: Secondary | ICD-10-CM

## 2018-06-10 LAB — BASIC METABOLIC PANEL
Anion gap: 9 (ref 5–15)
BUN: 15 mg/dL (ref 8–23)
CO2: 17 mmol/L — ABNORMAL LOW (ref 22–32)
Calcium: 7.6 mg/dL — ABNORMAL LOW (ref 8.9–10.3)
Chloride: 114 mmol/L — ABNORMAL HIGH (ref 98–111)
Creatinine, Ser: 0.61 mg/dL (ref 0.44–1.00)
GFR calc Af Amer: >60 mL/min (ref >60)
GFR calc non Af Amer: >60 mL/min (ref >60)
Glucose, Bld: 176 mg/dL — ABNORMAL HIGH (ref 70–99)
Potassium: 3.9 mmol/L (ref 3.5–5.1)
SODIUM: 140 mmol/L (ref 135–145)

## 2018-06-10 LAB — CBC
HCT: 30.5 % — ABNORMAL LOW (ref 36.0–46.0)
Hemoglobin: 9.2 g/dL — ABNORMAL LOW (ref 12.0–15.0)
MCH: 26.7 pg (ref 26.0–34.0)
MCHC: 30.2 g/dL (ref 30.0–36.0)
MCV: 88.7 fL (ref 80.0–100.0)
Platelets: 359 10*3/uL (ref 150–400)
RBC: 3.44 MIL/uL — ABNORMAL LOW (ref 3.87–5.11)
RDW: 15.8 % — ABNORMAL HIGH (ref 11.5–15.5)
WBC: 19.9 10*3/uL — ABNORMAL HIGH (ref 4.0–10.5)
nRBC: 1.1 % — ABNORMAL HIGH (ref 0.0–0.2)

## 2018-06-10 LAB — STREP PNEUMONIAE URINARY ANTIGEN: STREP PNEUMO URINARY ANTIGEN: NEGATIVE

## 2018-06-10 LAB — GLUCOSE, CAPILLARY
GLUCOSE-CAPILLARY: 161 mg/dL — AB (ref 70–99)
GLUCOSE-CAPILLARY: 211 mg/dL — AB (ref 70–99)
Glucose-Capillary: 140 mg/dL — ABNORMAL HIGH (ref 70–99)
Glucose-Capillary: 180 mg/dL — ABNORMAL HIGH (ref 70–99)
Glucose-Capillary: 184 mg/dL — ABNORMAL HIGH (ref 70–99)

## 2018-06-10 LAB — LACTIC ACID, PLASMA: LACTIC ACID, VENOUS: 2.2 mmol/L — AB (ref 0.5–1.9)

## 2018-06-10 MED ORDER — GLUCERNA SHAKE PO LIQD
237.0000 mL | ORAL | Status: DC
  Administered 2018-06-10: 237 mL via ORAL

## 2018-06-10 MED ORDER — LORATADINE 10 MG PO TABS
10.0000 mg | ORAL_TABLET | Freq: Every day | ORAL | Status: DC
  Administered 2018-06-10 – 2018-06-11 (×2): 10 mg via ORAL
  Filled 2018-06-10: qty 1

## 2018-06-10 MED ORDER — SACCHAROMYCES BOULARDII 250 MG PO CAPS
250.0000 mg | ORAL_CAPSULE | Freq: Two times a day (BID) | ORAL | Status: DC
  Administered 2018-06-10 – 2018-06-11 (×2): 250 mg via ORAL
  Filled 2018-06-10 (×2): qty 1

## 2018-06-10 NOTE — Progress Notes (Addendum)
PROGRESS NOTE    Karla Maxwell  IOX:[id number] DOB: 1950-07-01 DOA: 06/08/2018 PCP: Chevis Pretty, FNP     Brief Narrative:  68 y.o. female with medical history of COPD, hypertension, hyperlipidemia, hypothyroidism, tobacco abuse, and GERD presenting with 3-day history of coughing, shortness of breath with remittent nausea and vomiting.  The patient began having symptoms on 06/05/2018 when she began developing a nonproductive cough with sore throat and chest congestion.  She stated that she was coughing up some yellow sputum that was occasionally blood-tinged.  She had some subjective fevers and chills, but denied any headache, neck pain, diarrhea.  The patient began having intermittent nausea and vomiting on 06/07/2018.  There is no hematemesis.  Patient continues to smoke 1 pack/day x 25 years.  She went to see her primary care provider on 06/06/2018.  The patient was given a prescription for Hycodan.  Unfortunately, her symptoms of shortness of breath and coughing continue to worsen.  As result, she presented emergency department for further evaluation. In the ED, the patient was afebrile with soft blood pressure 100-110.  The patient was noted to have oxygen saturation of 81% on room air.  The patient had increased work of breathing requiring placement on BiPAP.  On BiPAP, the patient remained somewhat tachypneic but gradually improved with oxygen saturation 99% on FiO2 60%.  Chest x-ray showed bibasilar opacities, right greater than left suggesting multifocal pneumonia.    Assessment & Plan: 1-sepsis, in the setting of community-acquired pneumonia -Continue IV antibiotics -Continue IV fluids; but adjust rate now that the patient is eating and drinking better. -Influenza PCR negative -Follow clinical response and continue supportive care. -Patient improving and sepsis criteria resolving appropriately -WBCs continue to be elevated but most likely in the setting of his steroids usage.  Patient  has remained afebrile -Lactic acid down to 2.2.   2-acute respiratory failure with hypoxia -Appears to be secondary to COPD exacerbation in the setting of community-acquired pneumonia -Patient required to be placed on BiPAP overnight 60% FiO2 supplementation on admission. -She has been stable and able to be weaned off BiPAP and at this time receiving 4L nasal cannula supplementation -Continue antibiotics, steroids and nebulizer management -Continue to wean oxygen as tolerated -Follow clinical response. -Will continue the use of flutter valve and Mucinex. -Continue Brovana  3-acute kidney injury -In the setting of sepsis and volume depletion -Will continue fluid resuscitation -Creatinine has trended down and essentially is back to normal range. -Follow-up trend intermittently.  4-hyperlipidemia -Continue statins and fenofibrate.  5-hypothyroidism  -continue Synthroid  6-hypertension -Blood pressure was soft on admission -Currently stable -Continue holding antihypertensive agents for now -Follow vital signs.  7-type 2 diabetes -A1c 6.6 -Modified carbohydrate diet has been discussed with patient -At discharge will initiate treatment with oral hypoglycemic agents.  8-tobacco abuse -extensive cessation counseling provided. -patient decline use of nicotine patch.  9-diarrhea -Most likely associated with the use of antibiotics -Will add Florastor  10-class II obesity -Body mass index is 35.86 kg/m. -Low calorie diet, portion control and increase physical therapy has been discussed with patient.  DVT prophylaxis: Lovenox Code Status: DNR Family Communication: Husband at bedside. Disposition Plan: Remains inpatient, continue IV steroids, nebulizer management, IV antibiotics, flutter valve and oxygen supplementation.  Continue treatment for COPD exacerbation and follow clinical response.  Consultants:   None  Procedures:   See below for x-ray  reports.  Antimicrobials:  Anti-infectives (From admission, onward)   Start     Dose/Rate Route Frequency Ordered  Stop   06/08/18 1800  cefTRIAXone (ROCEPHIN) 2 g in sodium chloride 0.9 % 100 mL IVPB     2 g 200 mL/hr over 30 Minutes Intravenous Every 24 hours 06/08/18 1755     06/08/18 1800  azithromycin (ZITHROMAX) 500 mg in sodium chloride 0.9 % 250 mL IVPB     500 mg 250 mL/hr over 60 Minutes Intravenous Every 24 hours 06/08/18 1755     06/08/18 1445  levofloxacin (LEVAQUIN) IVPB 750 mg  Status:  Discontinued     750 mg 100 mL/hr over 90 Minutes Intravenous Every 24 hours 06/08/18 1436 06/09/18 0938       Subjective: Slowly improving.  Still having difficulty speaking in full sentences and experiencing shortness of breath with minimal exertion.  Patient requiring 4 L nasal cannula supplementation and expressed intermittent coughing spells.  Objective: Vitals:   06/10/18 0849 06/10/18 0900 06/10/18 1425 06/10/18 1547  BP:      Pulse:  (!) 108    Resp:  (!) 33    Temp: 98 F (36.7 C)   (!) 97.4 F (36.3 C)  TempSrc: Oral   Oral  SpO2:  (!) 89% 94%   Weight:      Height:        Intake/Output Summary (Last 24 hours) at 06/10/2018 1603 Last data filed at 06/10/2018 1544 Gross per 24 hour  Intake 536.51 ml  Output 1700 ml  Net -1163.49 ml   Filed Weights   06/08/18 1226 06/10/18 0500  Weight: 83.3 kg 87.5 kg    Examination: General exam: Alert, awake, oriented x 3; still reporting shortness of breath with minimal exertion and having intermittent episode of coughing spells.  No further hemoptysis; improvement in her ability to almost speak in full sentences and requiring 4 L nasal cannula. Respiratory system: Positive rhonchi, diffuse expiratory wheezing, improvement in air movement.  Still with positive tachypnea with minimal exertion.  No using accessory muscles.  4 L nasal cannula supplementation. Cardiovascular system: Mild sinus tachycardia, no rubs, no gallops, no  JVD on exam.   Gastrointestinal system: Abdomen is obese, nondistended, soft and nontender. No organomegaly or masses felt. Normal bowel sounds heard. Central nervous system: Alert and oriented. No focal neurological deficits. Extremities: Right upper extremity amputation appreciated; no cyanosis, no clubbing. Skin: No rashes, lesions or ulcers Psychiatry: Judgement and insight appear normal. Mood & affect appropriate.   Data Reviewed: I have personally reviewed following labs and imaging studies  CBC: Recent Labs  Lab 06/08/18 1300 06/09/18 0413 06/10/18 0423  WBC 12.0* 15.1* 19.9*  NEUTROABS 6.8  --   --   HGB 11.2* 8.6* 9.2*  HCT 36.2 27.3* 30.5*  MCV 85.4 85.0 88.7  PLT 361 350 009   Basic Metabolic Panel: Recent Labs  Lab 06/08/18 1300 06/09/18 0413 06/10/18 0423  NA 128* 135 140  K 3.4* 3.6 3.9  CL 97* 111 114*  CO2 20* 17* 17*  GLUCOSE 187* 186* 176*  BUN 30* 21 15  CREATININE 1.29* 0.81 0.61  CALCIUM 8.5* 7.5* 7.6*  MG  --  1.1*  --    GFR: Estimated Creatinine Clearance: 69.4 mL/min (by C-G formula based on SCr of 0.61 mg/dL).   Liver Function Tests: Recent Labs  Lab 06/08/18 1300  AST 19  ALT 15  ALKPHOS 38  BILITOT 0.9  PROT 6.7  ALBUMIN 3.3*   Recent Labs  Lab 06/08/18 1300  LIPASE 20   Coagulation Profile: Recent Labs  Lab 06/08/18 1807  INR 1.22   Cardiac Enzymes: Recent Labs  Lab 06/08/18 1300  TROPONINI <0.03   HbA1C: Recent Labs    06/08/18 1807  HGBA1C 6.6*   CBG: Recent Labs  Lab 06/09/18 1146 06/09/18 1606 06/09/18 2057 06/10/18 0725 06/10/18 1104  GLUCAP 203* 142* 204* 161* 211*    Recent Results (from the past 240 hour(s))  Group A Strep by PCR     Status: None   Collection Time: 06/08/18  1:00 PM  Result Value Ref Range Status   Group A Strep by PCR NOT DETECTED NOT DETECTED Final    Comment: Performed at [facility], [address], [place], [place] [address]  Culture, blood (routine x 2) Call MD if  unable to obtain prior to antibiotics being given     Status: None (Preliminary result)   Collection Time: 06/08/18  2:43 PM  Result Value Ref Range Status   Specimen Description BLOOD LEFT FOREARM  Final   Special Requests   Final    BOTTLES DRAWN AEROBIC AND ANAEROBIC Blood Culture adequate volume   Culture   Final    NO GROWTH 2 DAYS Performed at [facility], [address] [place], [place] [address]    Report Status PENDING  Incomplete  MRSA PCR Screening     Status: None   Collection Time: 06/09/18  1:10 AM  Result Value Ref Range Status   MRSA by PCR NEGATIVE NEGATIVE Final    Comment:        The GeneXpert MRSA Assay (FDA approved for NASAL specimens only), is one component of a comprehensive MRSA colonization surveillance program. It is not intended to diagnose MRSA infection nor to guide or monitor treatment for MRSA infections. Performed at [facility], [address] [place], [place] [address]      Radiology Studies: Dg Chest [facility] 1 View  Result Date: 06/09/2018 CLINICAL DATA:  Productive cough.  Sepsis. EXAM: PORTABLE CHEST 1 VIEW COMPARISON:  Yesterday FINDINGS: Unchanged right more than left pneumonia without evident effusion or cavitation. Stable heart size. Generalized interstitial coarsening likely related to patient's history of COPD. IMPRESSION: Unchanged bilateral pneumonia. Electronically Signed   By: Monte Fantasia M.D.   On: 06/09/2018 05:36    Scheduled Meds: . arformoterol  15 mcg Nebulization BID  . aspirin EC  162 mg Oral Daily  . atorvastatin  80 mg Oral Daily  . budesonide (PULMICORT) nebulizer solution  0.5 mg Nebulization BID  . enoxaparin (LOVENOX) injection  40 mg Subcutaneous Q24H  . feeding supplement (GLUCERNA SHAKE)  237 mL Oral Q24H  . fenofibrate  160 mg Oral Daily  . insulin aspart  0-9 Units Subcutaneous TID WC  . ipratropium  0.5 mg Nebulization Q6H  . ketotifen  2 drop Both Eyes BID  . levalbuterol  0.63 mg Nebulization Q6H  .  levothyroxine  75 mcg Oral QAC breakfast  . mouth rinse  15 mL Mouth Rinse BID  . methylPREDNISolone (SOLU-MEDROL) injection  60 mg Intravenous Q6H  . montelukast  10 mg Oral Q breakfast  . pantoprazole  80 mg Oral Daily   Continuous Infusions: . 0.9 % NaCl with KCl 20 mEq / L Stopped (06/09/18 1747)  . azithromycin Stopped (06/09/18 1941)  . cefTRIAXone (ROCEPHIN)  IV Stopped (06/09/18 1745)     LOS: 2 days    Time spent: 35 minutes.   Barton Dubois, MD [facility] Pager [phone]  If 7PM-7AM, please contact night-coverage [email or web address] Password [email or web address] 06/10/2018, 4:03 PM

## 2018-06-10 NOTE — Progress Notes (Signed)
Inpatient Diabetes Program Recommendations  AACE/ADA: New Consensus Statement on Inpatient Glycemic Control (2015)  Target Ranges:  Prepandial:   less than 140 mg/dL      Peak postprandial:   less than 180 mg/dL (1-2 hours)      Critically ill patients:  140 - 180 mg/dL   Lab Results  Component Value Date   GLUCAP 211 (H) 06/10/2018   HGBA1C 6.6 (H) 06/08/2018    Review of Glycemic Control  Results for Karla Maxwell, Karla Maxwell (MRN [id number]) as of 06/10/2018 11:12  Ref. Range 06/09/2018 07:31 06/09/2018 11:46 06/09/2018 16:06 06/09/2018 20:57 06/10/2018 07:25 06/10/2018 11:04  Glucose-Capillary Latest Ref Range: 70 - 99 mg/dL 171 (H) 203 (H) 142 (H) 204 (H) 161 (H) 211 (H)   Inpatient Diabetes Program Recommendations:    Consider increasing Novolog to 0-15 units. Still on IV Solumedrol 60 mg Q6.  Thanks,  Tama Headings RN, MSN, BC-ADM Inpatient Diabetes Coordinator Team Pager [phone] (8a-5p)

## 2018-06-10 NOTE — Progress Notes (Signed)
Initial Nutrition Assessment  DOCUMENTATION CODES:  Obesity unspecified  INTERVENTION:  Glucerna Shake q24 hrs, each supplement provides 220 kcal and 10 grams of protein  Tray additives per pt request  NUTRITION DIAGNOSIS:  Increased nutrient needs related to acute illness(Multilobar PNA) as evidenced by estimated nutrition requirements for this acute condition  GOAL:  Patient will meet greater than or equal to 90% of their needs  MONITOR:  PO intake, Supplement acceptance, Weight trends, Labs, I & O's  REASON FOR ASSESSMENT:  Malnutrition Screening Tool    ASSESSMENT:  68 y/o female COPD, HTN/HLD, hypothyroidism, tobacco abuse, GERD. Presented w/ 3 days SOB, hemoptysis and n/v refractory to use of hycodan. In ED, pt found to be hypoxic, requiring BiPAP. CXR showed multifocal PNA and pt admitted to ICU for management.   Pt reports she has had very poor intake for about 4 days PTA which she attributes to her SOB and nausea. She says she had just small amounts during the day and ate bland items, such as yogurt.   She believes she has lost weight. She was 183.5 lbs on admit and says this is about 8 lbs below her usual weight. However, from available chart history, she has been 183-187 since 2014.   Since she has been admitted and treated, her appetite has quickly rebounded. Her nurse today says she has eaten well at every meals and rd observed her breakfast tray at bedside- 100% completed (may in part be d/t steroids). Pt was open to trying an oral supplement. She also agreed to a couple tray additives.   Currently her BG is elevated. She says she does not have any hx of DM and believes this is related to steroids, iv abx and infection.  Labs: BGs 160-200, WBC:19.9, Lactic:2.2 Meds: Insulin, ppi, IV Methylprednisolone, fenofibrate  Recent Labs  Lab 06/08/18 1300 06/09/18 0413 06/10/18 0423  NA 128* 135 140  K 3.4* 3.6 3.9  CL 97* 111 114*  CO2 20* 17* 17*  BUN 30* 21 15   CREATININE 1.29* 0.81 0.61  CALCIUM 8.5* 7.5* 7.6*  MG  --  1.1*  --   GLUCOSE 187* 186* 176*   NUTRITION - FOCUSED PHYSICAL EXAM:   Most Recent Value  Orbital Region  No depletion  Upper Arm Region  No depletion  Thoracic and Lumbar Region  No depletion  Buccal Region  No depletion  Temple Region  No depletion  Clavicle Bone Region  No depletion  Clavicle and Acromion Bone Region  No depletion  Scapular Bone Region  No depletion  Dorsal Hand  No depletion  Patellar Region  No depletion  Anterior Thigh Region  No depletion  Posterior Calf Region  No depletion  Edema (RD Assessment)  None  Hair  Reviewed  Eyes  Reviewed  Mouth  Reviewed  Skin  Reviewed  Nails  Reviewed     Diet Order:   Diet Order            Diet heart healthy/carb modified Room service appropriate? Yes; Fluid consistency: Thin  Diet effective now             EDUCATION NEEDS: No education needs have been identified at this time  Skin:  Skin Assessment: Reviewed RN Assessment  Last BM:  2/6  Height:  Ht Readings from Last 1 Encounters:  06/08/18 5' 1.5" (1.562 m)   Weight:  Wt Readings from Last 1 Encounters:  06/10/18 87.5 kg   Wt Readings from Last 10 Encounters:  06/10/18 87.5 kg  06/06/18 84.6 kg  05/09/18 84.8 kg  02/03/18 83.9 kg  08/10/17 84.8 kg  08/03/17 83.9 kg  06/08/17 83 kg  02/25/17 81.6 kg  08/26/16 83.5 kg  05/26/16 83.3 kg   Ideal Body Weight:  48.87 kg  BMI:  Body mass index is 35.86 kg/m.  Estimated Nutritional Needs:  Kcal:  1500-1650 (18-20 kcal/kg bw) Protein:  68-78g Pro (1.4-1.6g/kg ibw) Fluid:  1.5-1.7 L fluid (51ml/kcal)  Burtis Junes RD, LDN, CNSC Clinical Nutrition Available Tues-Sat via Pager: [phone] 06/10/2018 1:04 PM

## 2018-06-11 DIAGNOSIS — Z6835 Body mass index (BMI) 35.0-35.9, adult: Secondary | ICD-10-CM

## 2018-06-11 DIAGNOSIS — E6609 Other obesity due to excess calories: Secondary | ICD-10-CM

## 2018-06-11 LAB — GLUCOSE, CAPILLARY
GLUCOSE-CAPILLARY: 156 mg/dL — AB (ref 70–99)
Glucose-Capillary: 157 mg/dL — ABNORMAL HIGH (ref 70–99)
Glucose-Capillary: 139 mg/dL — ABNORMAL HIGH (ref 70–99)

## 2018-06-11 LAB — URINE CULTURE: CULTURE: NO GROWTH

## 2018-06-11 MED ORDER — BLOOD GLUCOSE MONITOR KIT: PACK | 0 refills | Status: DC

## 2018-06-11 MED ORDER — PREDNISONE 20 MG PO TABS: ORAL_TABLET | ORAL | 0 refills | Status: DC

## 2018-06-11 MED ORDER — METFORMIN HCL 500 MG PO TABS: 250.0000 mg | ORAL_TABLET | Freq: Two times a day (BID) | ORAL | 3 refills | Status: DC

## 2018-06-11 MED ORDER — SACCHAROMYCES BOULARDII 250 MG PO CAPS: 250.0000 mg | ORAL_CAPSULE | Freq: Two times a day (BID) | ORAL | 0 refills | Status: DC

## 2018-06-11 MED ORDER — LORATADINE 10 MG PO TABS: 10.0000 mg | ORAL_TABLET | Freq: Every day | ORAL | 1 refills | Status: DC

## 2018-06-11 MED ORDER — HYDROCODONE-HOMATROPINE 5-1.5 MG/5ML PO SYRP: 5.0000 mL | ORAL_SOLUTION | Freq: Three times a day (TID) | ORAL | 0 refills | Status: DC | PRN

## 2018-06-11 MED ORDER — TIOTROPIUM BROMIDE MONOHYDRATE 18 MCG IN CAPS: 18.0000 ug | ORAL_CAPSULE | Freq: Every day | RESPIRATORY_TRACT | 2 refills | Status: DC

## 2018-06-11 MED ORDER — NICOTINE 14 MG/24HR TD PT24: 14.0000 mg | MEDICATED_PATCH | TRANSDERMAL | 2 refills | Status: AC

## 2018-06-11 MED ORDER — AZITHROMYCIN 250 MG PO TABS
500.0000 mg | ORAL_TABLET | Freq: Every day | ORAL | Status: DC
  Administered 2018-06-11: 500 mg via ORAL
  Filled 2018-06-11: qty 2

## 2018-06-11 MED ORDER — AMOXICILLIN-POT CLAVULANATE 875-125 MG PO TABS: 1.0000 | ORAL_TABLET | Freq: Two times a day (BID) | ORAL | 0 refills | Status: AC

## 2018-06-11 NOTE — Progress Notes (Signed)
Patient discharged home today per MD orders. Patient vital signs WDL. IV removed and site WDL. Discharge Instructions including follow up appointments, medications, and education reviewed with patient. Patient verbalizes understanding. Josh was contact to find out about oxygen delievery and he said it would not be delivered until later tonight. Home oxygen is set up to be delivered to home through [facility] due to  Patient not having a ride home after dark and stating she cant stay.  Patient is transported out via wheelchair.

## 2018-06-11 NOTE — Progress Notes (Signed)
PHARMACIST - PHYSICIAN COMMUNICATION DR:   Dyann Kief CONCERNING: Antibiotic IV to Oral Route Change Policy  RECOMMENDATION: This patient is receiving Azithromycin by the intravenous route.  Based on criteria approved by the Pharmacy and Therapeutics Committee, the antibiotic(s) is/are being converted to the equivalent oral dose form(s).   DESCRIPTION: These criteria include:  Patient being treated for a respiratory tract infection, urinary tract infection, cellulitis or clostridium difficile associated diarrhea if on metronidazole  The patient is not neutropenic and does not exhibit a GI malabsorption state  The patient is eating (either orally or via tube) and/or has been taking other orally administered medications for a least 24 hours  The patient is improving clinically and has a Tmax < 100.5  If you have questions about this conversion, please contact the Pharmacy Department  [x]   [phone] )  [facility] []   [phone] )  [facility] []   [phone] )  [facility] []   [phone] )  [facility] []   [phone] )  [place], [place], [place] Pharmacist Pager [phone]

## 2018-06-11 NOTE — Discharge Summary (Signed)
Physician Discharge Summary  Karla Maxwell CLE:[id number] DOB: 05/19/1950 DOA: 06/08/2018  PCP: Chevis Pretty, FNP  Admit date: 06/08/2018 Discharge date: 06/11/2018  Time spent: 35 minutes  Recommendations for Outpatient Follow-up:  1. [place] basic metabolic panel to follow electrolytes and renal function 2. Repeat CBC to follow WBC strain 3. Close follow-up to A1c and CBGs with further adjustment to hypoglycemic regimen as needed. 4. Reassess blood pressure and adjust antihypertensive regimen as required. 5. Repeat chest x-ray in 6 weeks to assure complete resolution of infiltrates. 6. Continue assisting patient with her smoking cessation journey.   Discharge Diagnoses:  Active Problems:   Hyperlipidemia   BMI 33.0-33.9,adult   Acute respiratory failure with hypoxia (HCC)   COPD with acute exacerbation (HCC)   Sepsis due to undetermined organism ([place])   Lobar pneumonia (HCC)   Tobacco abuse   Essential hypertension   Class 2 obesity due to excess calories without serious comorbidity with body mass index (BMI) of 35.0 to 35.9 in adult Type 2 diabetes mellitus with hyperglycemia: New diagnosis (A1c 6.6).  Discharge Condition: Stable and improved.  Patient discharged home with instruction to follow-up with PCP in 10 days.  Oxygen arranged to provide 2 L supplementation.  Diet recommendation: Modified carbohydrate, low calorie and heart healthy diet.  Filed Weights   06/08/18 1226 06/10/18 0500  Weight: 83.3 kg 87.5 kg    History of present illness:  As per H&P written by Dr. Carles Collet on 06/08/2018 68 y.o. female with medical history of COPD, hypertension, hyperlipidemia, hypothyroidism, tobacco abuse, and GERD presenting with 3-day history of coughing, shortness of breath with remittent nausea and vomiting.  The patient began having symptoms on 06/05/2018 when she began developing a nonproductive cough with sore throat and chest congestion.  She stated that she was coughing up some  yellow sputum that was occasionally blood-tinged.  She had some subjective fevers and chills, but denied any headache, neck pain, diarrhea.  The patient began having intermittent nausea and vomiting on 06/07/2018.  There is no hematemesis.  Patient continues to smoke 1 pack/day x 25 years.  She went to see her primary care provider on 06/06/2018.  The patient was given a prescription for Hycodan.  Unfortunately, her symptoms of shortness of breath and coughing continue to worsen.  As result, she presented emergency department for further evaluation. In the ED, the patient was afebrile with soft blood pressure 100-110.  The patient was noted to have oxygen saturation of 81% on room air.  The patient had increased work of breathing requiring placement on BiPAP.  On BiPAP, the patient remained somewhat tachypneic but gradually improved with oxygen saturation 99% on FiO2 60%.  Chest x-ray showed bibasilar opacities, right greater than left suggesting multifocal pneumonia.  The patient was started on levofloxacin and she was also given a dose of Solu-Medrol in the emergency department.  BMP showed sodium 128, potassium 3.4, serum creatinine 1.29.  LFTs were unremarkable.  WBC was 12.0.  Lactic acid peaked at 5.1.  Hospital Course:  1-sepsis, in the setting of community-acquired pneumonia -Sepsis criteria resolve by the time of discharge; except for mild elevation of WBCs in the setting of his steroids usage. -Influenza PCR negative -Patient advised to keep herself well-hydrated. -Continue treatment for community-acquired pneumonia and COPD exacerbation as mentioned below.   2-acute respiratory failure with hypoxia -Appears to be secondary to COPD exacerbation in the setting of community-acquired pneumonia -Patient required to be placed on BiPAP overnight 60% FiO2 supplementation on  admission. -She has been stable and able to be weaned off BiPAP and at this time receiving 2L nasal cannula supplementation (mainly  on exertion) -Continue antibiotics, steroids tapering and inhaler management -Continue to wean oxygen as tolerated -Will continue the use of flutter valve -Patient follow-up with pulmonary service to repeat PFTs and further adjust regimen as needed. -Patient has been started on Spiriva.  3-acute kidney injury -In the setting of sepsis and volume depletion -Resolved with fluid resuscitation. -Creatinine has trended down and essentially is back to normal range. -Follow-up basic metabolic panel follow-up visit to reassess renal function trend.  4-hyperlipidemia -Continue statins and fenofibrate.  5-hypothyroidism  -continue Synthroid  6-hypertension -Currently stable -Resume home antihypertensive regimen. -Follow vital signs and adjust management as required. -Heart healthy diet has been discussed with patient.  7-type 2 diabetes -A1c 6.6 -Modified carbohydrate diet has been discussed with patient -At discharge will initiate treatment with metformin 250 mg by mouth twice a day -Glucose monitoring kit has been prescribed -Outpatient follow-up with PCP to further adjust hypoglycemic regimen as needed.  8-tobacco abuse -extensive cessation counseling provided. -Prescription for nicotine patch given at discharge.  9-diarrhea -Most likely associated with the use of antibiotics -Will discharge home Florastor -Patient advised to increase yogurt consumption.  10-class II obesity -Body mass index is 35.86 kg/m. -Low calorie diet, portion control and increase physical therapy has been discussed with patient.  Procedures:  See below for x-ray reports.  Consultations:  None   Discharge Exam: Vitals:   06/11/18 1345 06/11/18 1507  BP: (!) 150/72   Pulse: 98   Resp: 20   Temp: 97.6 F (36.4 C)   SpO2: 93% 94%    General: Alert, awake and oriented x3; reports shortness of breath with exertion, but is able to speak in full sentences are overall feeling better.   She has required 2 L of nasal cannula supplementation on exertion to maintain O2 sats. Cardiovascular: S1 and S2, no rubs, no gallops, no JVD. Respiratory: Improved air movement bilaterally, no wheezing, no crackles, positive for scattered rhonchi.  Normal respiratory effort. Abdomen: Obese, nontender, distended, positive bowel sounds. Extremities: No edema, no cyanosis, no clubbing.  Right upper extremity demonstrating prior amputation.  Discharge Instructions   Discharge Instructions    Diet - low sodium heart healthy   Complete by:  As directed    Discharge instructions   Complete by:  As directed    Stop smoking and avoid secondhand smoking Maintain adequate hydration Take medications as prescribed Arrange follow-up with PCP in 10 days. Follow heart healthy/low calorie diet. Due to please use of Voltaren activity and use oxygen supplementation as instructed (especially on exertion).     Allergies as of 06/11/2018      Reactions   Sulfa Antibiotics Rash      Medication List    TAKE these medications   amLODipine 5 MG tablet Commonly known as:  NORVASC TAKE two TABLETS BY MOUTH ONE  TIME DAILY What changed:    how much to take  how to take this  when to take this  additional instructions   amoxicillin-clavulanate 875-125 MG tablet Commonly known as:  AUGMENTIN Take 1 tablet by mouth 2 (two) times daily for 10 doses.   aspirin EC 81 MG tablet Take 162 mg by mouth daily.   atorvastatin 80 MG tablet Commonly known as:  LIPITOR Take 1 tablet (80 mg total) by mouth daily.   azelastine 0.05 % ophthalmic solution Commonly known as:  OPTIVAR Place 2 drops into both eyes daily.   blood glucose meter kit and supplies Kit Dispense based on patient and insurance preference. Use up to four times daily as directed. (FOR ICD-9 250.00, 250.01).   budesonide-formoterol 160-4.5 MCG/ACT inhaler Commonly known as:  SYMBICORT INHALE 2 PUFFS BY MOUTH INTO  THE LUNGS TWICE A  DAY AS INSTRUCTED What changed:    how much to take  how to take this  when to take this  additional instructions   fenofibrate micronized 134 MG capsule Commonly known as:  LOFIBRA TAKE ONE CAPSULE BY MOUTH ONE TIME DAILY BEFORE BREAKFAST What changed:    how much to take  how to take this  when to take this  additional instructions   HYDROcodone-homatropine 5-1.5 MG/5ML syrup Commonly known as:  HYCODAN Take 5 mLs by mouth every 8 (eight) hours as needed (Severe coughing spells.). What changed:    when to take this  reasons to take this   levothyroxine 75 MCG tablet Commonly known as:  SYNTHROID, LEVOTHROID TAKE 1 TABLET (75 MCG TOTAL)   BY MOUTH DAILY. What changed:    how much to take  how to take this  when to take this  additional instructions   loratadine 10 MG tablet Commonly known as:  CLARITIN Take 1 tablet (10 mg total) by mouth daily. Start taking on:  June 12, 2018   metFORMIN 500 MG tablet Commonly known as:  GLUCOPHAGE Take 0.5 tablets (250 mg total) by mouth 2 (two) times daily with a meal.   montelukast 10 MG tablet Commonly known as:  SINGULAIR Take 1 tablet (10 mg total) by mouth daily with breakfast.   nicotine 14 mg/24hr patch Commonly known as:  NICODERM CQ - dosed in mg/24 hours Place 1 patch (14 mg total) onto the skin daily for 30 days.   omeprazole 40 MG capsule Commonly known as:  PRILOSEC Take 1 capsule (40 mg total) by mouth daily.   predniSONE 20 MG tablet Commonly known as:  DELTASONE 3 Tablets by mouth daily x1 day; then 2 tablets by mouth daily x2 day; then 1 tablet by mouth daily x2 days; then half tablet by mouth daily x3 days and stop prednisone.   saccharomyces boulardii 250 MG capsule Commonly known as:  FLORASTOR Take 1 capsule (250 mg total) by mouth 2 (two) times daily.   tiotropium 18 MCG inhalation capsule Commonly known as:  SPIRIVA HANDIHALER Place 1 capsule (18 mcg total) into inhaler and  inhale daily.            Durable Medical Equipment  (From admission, onward)         Start     Ordered   06/11/18 1319  For home use only DME oxygen  Once    Question Answer Comment  Mode or (Route) Nasal cannula   Liters per Minute 2   Frequency Continuous (stationary and portable oxygen unit needed)   Oxygen conserving device Yes   Oxygen delivery system Gas      06/11/18 1318         Allergies  Allergen Reactions  . Sulfa Antibiotics Rash   Follow-up Information    Chevis Pretty, FNP. Schedule an appointment as soon as possible for a visit in 10 day(s).   Specialty:  Family Medicine Contact information: [address] [place] [address] [phone]           The results of significant diagnostics from this hospitalization (including imaging, microbiology,  ancillary and laboratory) are listed below for reference.    Significant Diagnostic Studies: Dg Chest 2 View  Result Date: 06/08/2018 CLINICAL DATA:  Cough. EXAM: CHEST - 2 VIEW COMPARISON:  February 25, 2017 FINDINGS: New right basilar infiltrate. New left perihilar infiltrate. Heart, hila, mediastinum, lungs, and pleura are otherwise unchanged. IMPRESSION: Bilateral infiltrates consistent multifocal pneumonia. Recommend follow-up to resolution. Electronically Signed   By: Dorise Bullion III M.D   On: 06/08/2018 14:24   Dg Chest Port 1 View  Result Date: 06/09/2018 CLINICAL DATA:  Productive cough.  Sepsis. EXAM: PORTABLE CHEST 1 VIEW COMPARISON:  Yesterday FINDINGS: Unchanged right more than left pneumonia without evident effusion or cavitation. Stable heart size. Generalized interstitial coarsening likely related to patient's history of COPD. IMPRESSION: Unchanged bilateral pneumonia. Electronically Signed   By: Monte Fantasia M.D.   On: 06/09/2018 05:36   Dg Abd 2 Views  Result Date: 06/08/2018 CLINICAL DATA:  Nausea and vomiting. EXAM: ABDOMEN - 2 VIEW COMPARISON:  None. FINDINGS:  Bibasilar infiltrates. No free air, portal venous gas, or pneumatosis. The bowel gas pattern is nonobstructive. No other acute abnormalities. IMPRESSION: No bowel obstruction identified. No free air noted on upright imaging. Bibasilar pneumonia. Electronically Signed   By: Dorise Bullion III M.D   On: 06/08/2018 14:24    Microbiology: Recent Results (from the past 240 hour(s))  Group A Strep by PCR     Status: None   Collection Time: 06/08/18  1:00 PM  Result Value Ref Range Status   Group A Strep by PCR NOT DETECTED NOT DETECTED Final    Comment: Performed at [facility], [address] [place], [place] [address]  Culture, blood (routine x 2) Call MD if unable to obtain prior to antibiotics being given     Status: None (Preliminary result)   Collection Time: 06/08/18  2:43 PM  Result Value Ref Range Status   Specimen Description BLOOD LEFT FOREARM  Final   Special Requests   Final    BOTTLES DRAWN AEROBIC AND ANAEROBIC Blood Culture adequate volume   Culture   Final    NO GROWTH 3 DAYS Performed at [facility], [address], [place], [place] [address]    Report Status PENDING  Incomplete  MRSA PCR Screening     Status: None   Collection Time: 06/09/18  1:10 AM  Result Value Ref Range Status   MRSA by PCR NEGATIVE NEGATIVE Final    Comment:        The GeneXpert MRSA Assay (FDA approved for NASAL specimens only), is one component of a comprehensive MRSA colonization surveillance program. It is not intended to diagnose MRSA infection nor to guide or monitor treatment for MRSA infections. Performed at [facility] [address], [place], [place] [address]   Urine culture     Status: None   Collection Time: 06/09/18  9:52 AM  Result Value Ref Range Status   Specimen Description URINE, RANDOM  Final   Special Requests   Final    NONE Performed at [facility], [address], [place], [place] [address]    Culture NO GROWTH  Final   Report Status 06/11/2018 FINAL  Final      Labs: Basic Metabolic Panel: Recent Labs  Lab 06/08/18 1300 06/09/18 0413 06/10/18 0423  NA 128* 135 140  K 3.4* 3.6 3.9  CL 97* 111 114*  CO2 20* 17* 17*  GLUCOSE 187* 186* 176*  BUN 30* 21 15  CREATININE 1.29* 0.81  0.61  CALCIUM 8.5* 7.5* 7.6*  MG  --  1.1*  --    Liver Function Tests: Recent Labs  Lab 06/08/18 1300  AST 19  ALT 15  ALKPHOS 38  BILITOT 0.9  PROT 6.7  ALBUMIN 3.3*   Recent Labs  Lab 06/08/18 1300  LIPASE 20   CBC: Recent Labs  Lab 06/08/18 1300 06/09/18 0413 06/10/18 0423  WBC 12.0* 15.1* 19.9*  NEUTROABS 6.8  --   --   HGB 11.2* 8.6* 9.2*  HCT 36.2 27.3* 30.5*  MCV 85.4 85.0 88.7  PLT 361 350 359   Cardiac Enzymes: Recent Labs  Lab 06/08/18 1300  TROPONINI <0.03   CBG: Recent Labs  Lab 06/10/18 1636 06/10/18 2125 06/10/18 2204 06/11/18 0751 06/11/18 1114  GLUCAP 140* 180* 184* 157* 139*    Signed:  Barton Dubois MD.  [facility] 06/11/2018, 3:10 PM

## 2018-06-11 NOTE — Progress Notes (Signed)
SATURATION QUALIFICATIONS: (This note is used to comply with regulatory documentation for home oxygen)  Patient Saturations on Room Air at Rest = 93%  Patient Saturations on Room Air while Ambulating = 84%  Patient Saturations on 2 Liters of oxygen while Ambulating = 94%  Please briefly explain why patient needs home oxygen: Patient oxygen saturation drops while ambulating without supplemental oxygen

## 2018-06-12 LAB — LEGIONELLA PNEUMOPHILA SEROGP 1 UR AG: L. pneumophila Serogp 1 Ur Ag: NEGATIVE

## 2018-06-13 ENCOUNTER — Telehealth: Payer: Self-pay | Admitting: *Deleted

## 2018-06-13 LAB — CULTURE, BLOOD (ROUTINE X 2)
Culture: NO GROWTH
Special Requests: ADEQUATE

## 2018-06-13 NOTE — Telephone Encounter (Signed)
Spoke directly with patient, Karla Maxwell.  Call Completed and Appointment Scheduled: Yes, Date: 06/23/18 with Chevis Pretty, [place] Date of Discharge:06/11/18   Discharge Facility: [facility] Discharge Diagnosis: community acquired pneumonia and sepsis  Patient and/or caregiver is knowledgeable of his/her condition(s) and treatment: Yes   MEDICATION RECONCILIATION Current medication list reviewed with patient:Yes    Accurate as of June 13, 2018  2:19 PM        amLODipine 5 MG tablet Commonly known as:  NORVASC Take one tablet by mouth one time daily (patient reported change from take two tablets by mouth one time daily)   amoxicillin-clavulanate 875-125 MG tablet Commonly known as:  AUGMENTIN Take 1 tablet by mouth 2 (two) times daily for 10 doses.  Picked up on 06/12/2018 and taking without any problems.    aspirin EC 81 MG tablet Take 162 mg by mouth daily.   atorvastatin 80 MG tablet Commonly known as:  LIPITOR Take 1 tablet (80 mg total) by mouth daily.   azelastine 0.05 % ophthalmic solution Commonly known as:  OPTIVAR Place 2 drops into both eyes daily.   blood glucose meter kit and supplies Kit Dispense based on patient and insurance preference. Use up to four times daily as directed. (FOR ICD-9 250.00, 250.01).   budesonide-formoterol 160-4.5 MCG/ACT inhaler Commonly known as:  SYMBICORT INHALE 2 PUFFS BY MOUTH INTO  THE LUNGS TWICE A DAY AS INSTRUCTED   fenofibrate micronized 134 MG capsule Commonly known as:  LOFIBRA TAKE ONE CAPSULE BY MOUTH ONE TIME DAILY BEFORE BREAKFAST   HYDROcodone-homatropine 5-1.5 MG/5ML syrup Commonly known as:  HYCODAN Take 5 mLs by mouth every 8 (eight) hours as needed (Severe coughing spells.).   levothyroxine 75 MCG tablet Commonly known as:  SYNTHROID, LEVOTHROID TAKE 1 TABLET (75 MCG TOTAL)   BY MOUTH DAILY.   loratadine 10 MG tablet Commonly known as:  CLARITIN Take 1  tablet (10 mg total) by mouth daily.   metFORMIN 500 MG tablet Commonly known as:  GLUCOPHAGE Take 0.5 tablets (250 mg total) by mouth 2 (two) times daily with a meal.   montelukast 10 MG tablet Commonly known as:  SINGULAIR Take 1 tablet (10 mg total) by mouth daily with breakfast.   nicotine 14 mg/24hr patch Commonly known as:  NICODERM CQ - dosed in mg/24 hours Place 1 patch (14 mg total) onto the skin daily for 30 days.   omeprazole 40 MG capsule Commonly known as:  PRILOSEC Take 1 capsule (40 mg total) by mouth daily.   predniSONE 20 MG tablet Commonly known as:  DELTASONE 3 Tablets by mouth daily x1 day; then 2 tablets by mouth daily x2 day; then 1 tablet by mouth daily x2 days; then half tablet by mouth daily x3 days and stop prednisone. Picked up on 06/12/2018 and taking without any problems   saccharomyces boulardii 250 MG capsule Commonly known as:  FLORASTOR Take 1 capsule (250 mg total) by mouth 2 (two) times daily.   tiotropium 18 MCG inhalation capsule Commonly known as:  SPIRIVA HANDIHALER Place 1 capsule (18 mcg total) into inhaler and inhale daily.     Used nebulizer in the hospital and was told that she did not need one once discharged; that her inhalers would be sufficient. She does have the two maintenance inhalers but does not have a current rescue inhaler Rx. I offered to have one sent in for her but she declined at this time,  stating that she didn't need it right now.   Discharge Medications reviewed and reconciled with current medications.yes  Patient is able to obtain needed medications:Yes. Picked meds up on 06/12/2018 because the pharmacy was closed when she was discharged on 06/11/2018.   ACTIVITIES OF DAILY LIVING  Is the patient able to perform his/her own ADLs: Yes.    Patient is receiving home health services: No. Stated that she didn't need anything like that right now.    PATIENT EDUCATION Questions/Concerns Discussed:   Call our office if  symptoms worsen or new symptoms develop  Use inhalers as directed  Use O2 as directed. If she finds that she needs it more often or for more activities, she should let us know.   Stop smoking  Recommendations for Outpatient Follow-up (from discharge summary): 1. Repeat basic metabolic panel to follow electrolytes and renal function 2. Repeat CBC to follow WBC strain 3. Close follow-up to A1c and CBGs with further adjustment to hypoglycemic regimen as needed. 4. Reassess blood pressure and adjust antihypertensive regimen as required. 5. Repeat chest x-ray in 6 weeks to assure complete resolution of infiltrates. 6. Continue assisting patient with her smoking cessation journey.   Karla Sicilian, RN-BC, BSN Nurse Case Manager [facility] Ph: [phone]

## 2018-06-21 ENCOUNTER — Other Ambulatory Visit: Payer: Self-pay | Admitting: *Deleted

## 2018-06-21 NOTE — Patient Outreach (Addendum)
[place] [facility]) Care Management  06/21/2018  Karla Maxwell [birth year] [id number]   EMMI-general discharge-[place]  RED ON EMMI ALERT Day # 4 Date: 06/17/2018 [place] Reason: Sad/hopeless/anxious/empty? Yes  Insurance: [employer]  [facility] admissions x 1 ED visits x 1 in the last 6 months  For community acquired pneumonia and sepsis  Outreach attempt # 1 Patient is able to verify HIPAA [facility] Care Management RN reviewed and addressed red alert with patient   EMMI Karla Maxwell reports she is "Fine" she states she answered the EMMI question yes related to her medications make her nervous CM discussed increased or Improper use of her respiratory medications can cause side effects to include jitteriness. Encouraged to use her inhalers only as ordered and to speak with her MD about the need for increase use or possible changes at f/u appointment. She voiced understandin  Today Karla Maxwell primarily voices concern about her hoarseness that comes and goes Cm discussed how respiratory s/s like colds, breathing in chemicals, gases, straining the vocal cords, smoking, acid reflux and aspiration issues can cause hoarseness. Karla Maxwell denies most of these irritants but reports she does have a wood heather in her home and "I still talk too much". She was encouraged to use humidifying device and to decrease talking as much as possible She uses a pot of water on her wood burning heater. She was encouraged to be careful around her wood burning heater related to particles and fumes. She reports use of hot cocoa and gargling with Listerine to assist with the throat discomfort. She reports having hoarseness issues also during summer months and states " I have bad allergies" She reports she has Spiriva and Symbicort at home but has not obtained Flonase as was recommended to her She and Cm discussed cost efficient stores to obtain OTC Flonase     Transition of care services noted to be  completed by primary care MD office staff   Social: Karla Maxwell lives alone but has support of a significant other who also assists with transporting her to medical appointments She is independent/assist with her care needs    Conditions: community acquired pneumonia and sepsis, HDL, allergies, COPD with home oxygen, tobacco use HTN class 2 obesity, Type 2 DM    Medications: denies concerns with taking medications as prescribed, affording medications, side effects of medications and questions about medications    Appointments: F/u with Karla Maxwell, primary care NP  on 06/23/2018 April 7 Primary MD office visit April 10   Advance Directives: Denies need for assist with advance directives    Consent: [facility] RN CM reviewed [facility] services with patient. Patient gave verbal consent for services.   Advised patient that there will be further automated EMMI- post discharge calls to assess how the patient is doing following the recent hospitalization Advised the patient that another call may be received from a nurse if any of their responses were abnormal. Patient voiced understanding and was appreciative of f/u call.   Plan: [facility] RN CM will send Karla Maxwell EMMI information via mail on Laryngitis, inhalers:using them the right way, exacerbation of COPD and heart healthy diet as requested  Route note to MD and sent in basket note to primary care office CM  [facility] RN CM will close case at this time as patient has been assessed and no needs identified/needs resolved.   Pt encouraged to return a call to [facility] RN CM prn  [facility] RN CM sent  a successful outreach letter as discussed with [facility] brochure enclosed for review   Karla Tworek L. Lavina Hamman, RN, BSN, [place] Coordinator Office number [phone] Mobile number [phone]  Main [facility] number [phone] Fax number [phone]

## 2018-06-23 ENCOUNTER — Ambulatory Visit (INDEPENDENT_AMBULATORY_CARE_PROVIDER_SITE_OTHER): Payer: Medicare HMO | Admitting: Nurse Practitioner

## 2018-06-23 ENCOUNTER — Encounter: Payer: Self-pay | Admitting: Nurse Practitioner

## 2018-06-23 VITALS — BP 148/72 | HR 83 | Temp 98.0°F | Ht 61.0 in | Wt 182.0 lb

## 2018-06-23 DIAGNOSIS — Z09 Encounter for follow-up examination after completed treatment for conditions other than malignant neoplasm: Secondary | ICD-10-CM

## 2018-06-23 DIAGNOSIS — J189 Pneumonia, unspecified organism: Secondary | ICD-10-CM | POA: Diagnosis not present

## 2018-06-23 NOTE — Patient Instructions (Signed)
Community-Acquired Pneumonia, Adult  Pneumonia is an infection of the lungs. It causes swelling in the airways of the lungs. Mucus and fluid may also build up inside the airways.  One type of pneumonia can happen while a person is in a hospital. A different type can happen when a person is not in a hospital (community-acquired pneumonia).   What are the causes?    This condition is caused by germs (viruses, bacteria, or fungi). Some types of germs can be passed from one person to another. This can happen when you breathe in droplets from the cough or sneeze of an infected person.  What increases the risk?  You are more likely to develop this condition if you:   Have a long-term (chronic) disease, such as:  ? Chronic obstructive pulmonary disease (COPD).  ? Asthma.  ? Cystic fibrosis.  ? Congestive heart failure.  ? Diabetes.  ? Kidney disease.   Have HIV.   Have sickle cell disease.   Have had your spleen removed.   Do not take good care of your teeth and mouth (poor dental hygiene).   Have a medical condition that increases the risk of breathing in droplets from your own mouth and nose.   Have a weakened body defense system (immune system).   Are a smoker.   Travel to areas where the germs that cause this illness are common.   Are around certain animals or the places they live.  What are the signs or symptoms?   A dry cough.   A wet (productive) cough.   Fever.   Sweating.   Chest pain. This often happens when breathing deeply or coughing.   Fast breathing or trouble breathing.   Shortness of breath.   Shaking chills.   Feeling tired (fatigue).   Muscle aches.  How is this treated?  Treatment for this condition depends on many things. Most adults can be treated at home. In some cases, treatment must happen in a hospital. Treatment may include:   Medicines given by mouth or through an IV tube.   Being given extra oxygen.   Respiratory therapy.  In rare cases, treatment for very bad pneumonia  may include:   Using a machine to help you breathe.   Having a procedure to remove fluid from around your lungs.  Follow these instructions at home:  Medicines   Take over-the-counter and prescription medicines only as told by your doctor.  ? Only take cough medicine if you are losing sleep.   If you were prescribed an antibiotic medicine, take it as told by your doctor. Do not stop taking the antibiotic even if you start to feel better.  General instructions     Sleep with your head and neck raised (elevated). You can do this by sleeping in a recliner or by putting a few pillows under your head.   Rest as needed. Get at least 8 hours of sleep each night.   Drink enough water to keep your pee (urine) pale yellow.   Eat a healthy diet that includes plenty of vegetables, fruits, whole grains, low-fat dairy products, and lean protein.   Do not use any products that contain nicotine or tobacco. These include cigarettes, e-cigarettes, and chewing tobacco. If you need help quitting, ask your doctor.   Keep all follow-up visits as told by your doctor. This is important.  How is this prevented?  A shot (vaccine) can help prevent pneumonia. Shots are often suggested for:   People   older than 68 years of age.   People older than 68 years of age who:  ? Are having cancer treatment.  ? Have long-term (chronic) lung disease.  ? Have problems with their body's defense system.  You may also prevent pneumonia if you take these actions:   Get the flu (influenza) shot every year.   Go to the dentist as often as told.   Wash your hands often. If you cannot use soap and water, use hand sanitizer.  Contact a doctor if:   You have a fever.   You lose sleep because your cough medicine does not help.  Get help right away if:   You are short of breath and it gets worse.   You have more chest pain.   Your sickness gets worse. This is very serious if:  ? You are an older adult.  ? Your body's defense system is weak.   You  cough up blood.  Summary   Pneumonia is an infection of the lungs.   Most adults can be treated at home. Some will need treatment in a hospital.   Drink enough water to keep your pee pale yellow.   Get at least 8 hours of sleep each night.  This information is not intended to replace advice given to you by your health care provider. Make sure you discuss any questions you have with your health care provider.  Document Released: 10/07/2007 Document Revised: 12/16/2017 Document Reviewed: 12/16/2017  Elsevier Interactive Patient Education  2019 [employer]

## 2018-06-23 NOTE — Progress Notes (Signed)
   Subjective:    Patient ID: Karla Maxwell, female    DOB: October 11, 1950, 68 y.o.   MRN: [id number]  Today's visit is for Transitional Care Management.  The patient was discharged from [facility] on 06/11/18 with a primary diagnosis of Community acquired pneumonia and sepsis.[name] with the patient and/or caregiver, by a clinical staff member, was made on 06/13/18 and was documented as a telephone encounter within the EMR.  Through chart review and discussion with the patient I have determined that management of their condition is of high  complexity due to her co morbidities.  Patient was seen in office on 06/06/18 with gastroenteritis and viral URI. She was given a steroid and cough meds. She continued to woren so she went to ER 2 days later and was dx with CAP and resp failure. Patient is a little upset because we did not dx her pneumonia on Monday night. I told her we did not have xray in evenings and that her vitals did not indicate that she was that critical when seen in office. She says she is much better. Still has slight cough. But is much better.   * the hospital started her metformin for her blood ugar but her hgba1c was 6.6 and could have cntinued diet control    Review of Systems  Constitutional: Negative.   Respiratory: Negative.   Cardiovascular: Negative.   Genitourinary: Negative.   Neurological: Negative.   Psychiatric/Behavioral: Negative.   All other systems reviewed and are negative.      Objective:   Physical Exam Constitutional:      Appearance: Normal appearance. She is obese.  Cardiovascular:     Rate and Rhythm: Normal rate and regular rhythm.     Heart sounds: Normal heart sounds.  Pulmonary:     Effort: Pulmonary effort is normal.     Breath sounds: Normal breath sounds.  Skin:    General: Skin is warm and dry.  Neurological:     General: No focal deficit present.     Mental Status: She is alert and oriented to person, place, and time.    Psychiatric:        Mood and Affect: Mood normal.        Behavior: Behavior normal.    BP (!) 148/72   Pulse 83   Temp 98 F (36.7 C) (Oral)   Ht '5\' 1"'$  (1.549 m)   Wt 182 lb (82.6 kg)   BMI 34.39 kg/m         Assessment & Plan:  Karla Maxwell in today with chief complaint of No chief complaint on file.   1. Community acquired pneumonia, unspecified laterality Continue smoking cessation Rest contiunue  o2 as needed Continue metformin until you are done with prednisone - CMP14+EGFR - CBC with Differential/Platelet  2. Hospital discharge follow-up Hospital records reviewed   [place], [place]

## 2018-06-24 LAB — CBC WITH DIFFERENTIAL/PLATELET
Basophils Absolute: 0.1 10*3/uL (ref 0.0–0.2)
Basos: 0 %
EOS (ABSOLUTE): 0.1 10*3/uL (ref 0.0–0.4)
Eos: 1 %
Hematocrit: 36.6 % (ref 34.0–46.6)
Hemoglobin: 12.1 g/dL (ref 11.1–15.9)
Immature Grans (Abs): 0.1 10*3/uL (ref 0.0–0.1)
Immature Granulocytes: 1 %
LYMPHS ABS: 4.7 10*3/uL — AB (ref 0.7–3.1)
Lymphs: 33 %
MCH: 27.4 pg (ref 26.6–33.0)
MCHC: 33.1 g/dL (ref 31.5–35.7)
MCV: 83 fL (ref 79–97)
Monocytes Absolute: 0.8 10*3/uL (ref 0.1–0.9)
Monocytes: 6 %
Neutrophils Absolute: 8.4 10*3/uL — ABNORMAL HIGH (ref 1.4–7.0)
Neutrophils: 59 %
Platelets: 793 10*3/uL — ABNORMAL HIGH (ref 150–450)
RBC: 4.41 x10E6/uL (ref 3.77–5.28)
RDW: 15.2 % (ref 11.7–15.4)
WBC: 14.2 10*3/uL — ABNORMAL HIGH (ref 3.4–10.8)

## 2018-06-24 LAB — CMP14+EGFR
A/G RATIO: 1.6 (ref 1.2–2.2)
ALBUMIN: 4.1 g/dL (ref 3.8–4.8)
ALT: 22 IU/L (ref 0–32)
AST: 12 IU/L (ref 0–40)
Alkaline Phosphatase: 49 IU/L (ref 39–117)
BUN / CREAT RATIO: 19 (ref 12–28)
BUN: 15 mg/dL (ref 8–27)
Bilirubin Total: 0.2 mg/dL (ref 0.0–1.2)
CO2: 22 mmol/L (ref 20–29)
Calcium: 10.5 mg/dL — ABNORMAL HIGH (ref 8.7–10.3)
Chloride: 103 mmol/L (ref 96–106)
Creatinine, Ser: 0.77 mg/dL (ref 0.57–1.00)
GFR calc Af Amer: 92 mL/min/{1.73_m2} (ref >59)
GFR calc non Af Amer: 80 mL/min/{1.73_m2} (ref >59)
Globulin, Total: 2.6 g/dL (ref 1.5–4.5)
Glucose: 111 mg/dL — ABNORMAL HIGH (ref 65–99)
POTASSIUM: 4.6 mmol/L (ref 3.5–5.2)
Sodium: 141 mmol/L (ref 134–144)
Total Protein: 6.7 g/dL (ref 6.0–8.5)

## 2018-07-10 DIAGNOSIS — J449 Chronic obstructive pulmonary disease, unspecified: Secondary | ICD-10-CM | POA: Diagnosis not present

## 2018-08-09 ENCOUNTER — Other Ambulatory Visit: Payer: Self-pay

## 2018-08-09 ENCOUNTER — Ambulatory Visit (INDEPENDENT_AMBULATORY_CARE_PROVIDER_SITE_OTHER): Payer: Medicare HMO | Admitting: Nurse Practitioner

## 2018-08-09 ENCOUNTER — Encounter: Payer: Self-pay | Admitting: Nurse Practitioner

## 2018-08-09 DIAGNOSIS — Z720 Tobacco use: Secondary | ICD-10-CM | POA: Diagnosis not present

## 2018-08-09 DIAGNOSIS — J4520 Mild intermittent asthma, uncomplicated: Secondary | ICD-10-CM

## 2018-08-09 DIAGNOSIS — E782 Mixed hyperlipidemia: Secondary | ICD-10-CM | POA: Diagnosis not present

## 2018-08-09 DIAGNOSIS — J449 Chronic obstructive pulmonary disease, unspecified: Secondary | ICD-10-CM

## 2018-08-09 DIAGNOSIS — Z6835 Body mass index (BMI) 35.0-35.9, adult: Secondary | ICD-10-CM

## 2018-08-09 DIAGNOSIS — E8881 Metabolic syndrome: Secondary | ICD-10-CM

## 2018-08-09 DIAGNOSIS — F330 Major depressive disorder, recurrent, mild: Secondary | ICD-10-CM | POA: Diagnosis not present

## 2018-08-09 DIAGNOSIS — E039 Hypothyroidism, unspecified: Secondary | ICD-10-CM

## 2018-08-09 DIAGNOSIS — E6609 Other obesity due to excess calories: Secondary | ICD-10-CM | POA: Diagnosis not present

## 2018-08-09 DIAGNOSIS — I10 Essential (primary) hypertension: Secondary | ICD-10-CM | POA: Diagnosis not present

## 2018-08-09 DIAGNOSIS — K219 Gastro-esophageal reflux disease without esophagitis: Secondary | ICD-10-CM | POA: Diagnosis not present

## 2018-08-09 DIAGNOSIS — M8580 Other specified disorders of bone density and structure, unspecified site: Secondary | ICD-10-CM

## 2018-08-09 MED ORDER — FENOFIBRATE MICRONIZED 134 MG PO CAPS: ORAL_CAPSULE | ORAL | 1 refills | Status: DC

## 2018-08-09 MED ORDER — BUDESONIDE-FORMOTEROL FUMARATE 160-4.5 MCG/ACT IN AERO: INHALATION_SPRAY | RESPIRATORY_TRACT | 1 refills | Status: DC

## 2018-08-09 MED ORDER — LEVOTHYROXINE SODIUM 75 MCG PO TABS: ORAL_TABLET | ORAL | 1 refills | Status: DC

## 2018-08-09 MED ORDER — MONTELUKAST SODIUM 10 MG PO TABS: 10.0000 mg | ORAL_TABLET | Freq: Every day | ORAL | 1 refills | Status: DC

## 2018-08-09 MED ORDER — ATORVASTATIN CALCIUM 80 MG PO TABS: 80.0000 mg | ORAL_TABLET | Freq: Every day | ORAL | 1 refills | Status: DC

## 2018-08-09 MED ORDER — LORATADINE 10 MG PO TABS: 10.0000 mg | ORAL_TABLET | Freq: Every day | ORAL | 1 refills | Status: DC

## 2018-08-09 MED ORDER — OMEPRAZOLE 40 MG PO CPDR: 40.0000 mg | DELAYED_RELEASE_CAPSULE | Freq: Every day | ORAL | 1 refills | Status: DC

## 2018-08-09 NOTE — Progress Notes (Signed)
Patient ID: Karla Maxwell, female   DOB: 1950/10/01, 68 y.o.   MRN: [id number]    Virtual Visit via telephone Note  I connected with Karla Maxwell on 08/09/18 at 9:20 AM by telephone and verified that I am speaking with the correct person using two identifiers. Karla Maxwell is currently located at home and no one is currently with her during visit. The provider, Mary-Margaret Hassell Done, FNP is located in their office at time of visit.  I discussed the limitations, risks, security and privacy concerns of performing an evaluation and management service by telephone and the availability of in person appointments. I also discussed with the patient that there may be a patient responsible charge related to this service. The patient expressed understanding and agreed to proceed.   History and Present Illness:   Chief Complaint: medical management of chronic issues  HPI:  1. Essential hypertension  No c/o chest pain, sob or headache. Doe snot check blood pressure at home. BP Readings from Last 3 Encounters:  06/23/18 (!) 148/72  06/11/18 (!) 150/72  06/06/18 128/60     2. COPD (chronic obstructive pulmonary disease) with chronic bronchitis ([place])  uses symbicort daily and spirivia daily. The spirivia is to expensive for her an dwould like to try something else if possible.  3. Mixed hyperlipidemia  Watches diet  4. Acquired hypothyroidism  No problems that she is aware of  5. Gastroesophageal reflux disease, esophagitis presence not specified  is on omeprazoel daiyly which helps to keep symptoms under control.  6. Mild episode of recurrent major depressive disorder (HCC)  is not currently on any meds at this time . Denies any depression. Depression screen [facility] 2/9 08/09/2018 06/23/2018 06/21/2018  Decreased Interest 0 0 0  Down, Depressed, Hopeless 0 0 0  PHQ - 2 Score 0 0 0  Altered sleeping - - -  Tired, decreased energy - - -  Change in appetite - - -  Feeling bad or failure about  yourself  - - -  Trouble concentrating - - -  Moving slowly or fidgety/restless - - -  Suicidal thoughts - - -  PHQ-9 Score - - -  Difficult doing work/chores - - -     7. Metabolic syndrome  Does not check blood sugars at home  8. Tobacco abuse  Has been using nicotine patches nad has cut back to less then a pack a day.  9. Class 2 obesity due to excess calories without serious comorbidity with body mass index (BMI) of 35.0 to 35.9 in adult  No recent weight chnages  10. Osteopenia, unspecified location  Last dexascan was done 08/03/17 with t score of -0.7    Outpatient Encounter Medications as of 08/09/2018  Medication Sig  . aspirin EC 81 MG tablet Take 162 mg by mouth daily.  Marland Kitchen atorvastatin (LIPITOR) 80 MG tablet Take 1 tablet (80 mg total) by mouth daily.  Marland Kitchen azelastine (OPTIVAR) 0.05 % ophthalmic solution Place 2 drops into both eyes daily.   . blood glucose meter kit and supplies KIT Dispense based on patient and insurance preference. Use up to four times daily as directed. (FOR ICD-9 250.00, 250.01).  . budesonide-formoterol (SYMBICORT) 160-4.5 MCG/ACT inhaler INHALE 2 PUFFS BY MOUTH INTO  THE LUNGS TWICE A DAY AS INSTRUCTED (Patient taking differently: Inhale 2 puffs into the lungs 2 (two) times daily. )  . fenofibrate micronized (LOFIBRA) 134 MG capsule TAKE ONE CAPSULE BY MOUTH ONE TIME DAILY BEFORE BREAKFAST (Patient  taking differently: Take 134 mg by mouth daily before breakfast. )  . HYDROcodone-homatropine (HYCODAN) 5-1.5 MG/5ML syrup Take 5 mLs by mouth every 8 (eight) hours as needed (Severe coughing spells.).  Marland Kitchen levothyroxine (SYNTHROID, LEVOTHROID) 75 MCG tablet TAKE 1 TABLET (75 MCG TOTAL)   BY MOUTH DAILY. (Patient taking differently: Take 75 mcg by mouth daily before breakfast. )  . loratadine (CLARITIN) 10 MG tablet Take 1 tablet (10 mg total) by mouth daily.  . metFORMIN (GLUCOPHAGE) 500 MG tablet Take 0.5 tablets (250 mg total) by mouth 2 (two) times daily with a  meal.  . montelukast (SINGULAIR) 10 MG tablet Take 1 tablet (10 mg total) by mouth daily with breakfast.  . omeprazole (PRILOSEC) 40 MG capsule Take 1 capsule (40 mg total) by mouth daily.  Marland Kitchen saccharomyces boulardii (FLORASTOR) 250 MG capsule Take 1 capsule (250 mg total) by mouth 2 (two) times daily. (Patient not taking: Reported on 06/23/2018)  . tiotropium (SPIRIVA HANDIHALER) 18 MCG inhalation capsule Place 1 capsule (18 mcg total) into inhaler and inhale daily.      New complaints: None today  Social history: Lives with her husband and they both smoke which is making it harder for her to quit   Review of Systems  Constitutional: Negative for diaphoresis and weight loss.  Eyes: Negative for blurred vision, double vision and pain.  Respiratory: Negative for shortness of breath.   Cardiovascular: Negative for chest pain, palpitations, orthopnea and leg swelling.  Gastrointestinal: Negative for abdominal pain.  Skin: Negative for rash.  Neurological: Negative for dizziness, sensory change, loss of consciousness, weakness and headaches.  Endo/Heme/Allergies: Negative for polydipsia. Does not bruise/bleed easily.  Psychiatric/Behavioral: Negative for memory loss. The patient does not have insomnia.   All other systems reviewed and are negative.      Observations/Objective: alert and oriented- answers all questions appropriately  Assessment and Plan: Karla Maxwell comes in today with chief complaint of No chief complaint on file.   Diagnosis and orders addressed:  1. Essential hypertension Low fat diet  2. COPD (chronic obstructive pulmonary disease) with chronic bronchitis (HCC) Continue symbic- montelukast (SINGULAIR) 10 MG tablet; Take 1 tablet (10 mg total) by mouth daily with breakfast.  Dispense: 90 tablet; Refill: 1 - budesonide-formoterol (SYMBICORT) 160-4.5 MCG/ACT inhaler; INHALE 2 PUFFS BY MOUTH INTO  THE LUNGS TWICE A DAY AS INSTRUCTED  Dispense: 33 g; Refill:  1 ort and spirivia   3. Mixed hyperlipidemia Low fat diet - atorvastatin (LIPITOR) 80 MG tablet; Take 1 tablet (80 mg total) by mouth daily.  Dispense: 90 tablet; Refill: 1 - fenofibrate micronized (LOFIBRA) 134 MG capsule; TAKE ONE CAPSULE BY MOUTH ONE TIME DAILY BEFORE BREAKFAST  Dispense: 90 capsule; Refill: 1  4. Acquired hypothyroidism - levothyroxine (SYNTHROID, LEVOTHROID) 75 MCG tablet; TAKE 1 TABLET (75 MCG TOTAL)   BY MOUTH DAILY.  Dispense: 90 tablet; Refill: 1  5. Gastroesophageal reflux disease, esophagitis presence not specified Avoid spicy foods Do not eat 2 hours prior to bedtime - omeprazole (PRILOSEC) 40 MG capsule; Take 1 capsule (40 mg total) by mouth daily.  Dispense: 90 capsule; Refill: 1  6. Mild episode of recurrent major depressive disorder ([place]) Stress management  7. Metabolic syndrome Watch carbs in diet  8. Tobacco abuse Continue smoking cessation  9. Class 2 obesity due to excess calories without serious comorbidity with body mass index (BMI) of 35.0 to 35.9 in adult Discussed diet and exercise for person with BMI >25 Will recheck weight  in 3-6 months  10. Osteopenia, unspecified location Weight bearing exercise    Previous labs reviewed Health Maintenance reviewed Diet and exercise encouraged  Follow up plan: 3 months    I discussed the assessment and treatment plan with the patient. The patient was provided an opportunity to ask questions and all were answered. The patient agreed with the plan and demonstrated an understanding of the instructions.   The patient was advised to call back or seek an in-person evaluation if the symptoms worsen or if the condition fails to improve as anticipated.  The above assessment and management plan was discussed with the patient. The patient verbalized understanding of and has agreed to the management plan. Patient is aware to call the clinic if symptoms persist or worsen. Patient is aware when to return  to the clinic for a follow-up visit. Patient educated on when it is appropriate to go to the emergency department.    I provided 15 minutes of non-face-to-face time during this encounter.    Mary-Margaret Hassell Done, FNP

## 2018-08-10 ENCOUNTER — Telehealth: Payer: Self-pay | Admitting: Nurse Practitioner

## 2018-08-10 DIAGNOSIS — J449 Chronic obstructive pulmonary disease, unspecified: Secondary | ICD-10-CM | POA: Diagnosis not present

## 2018-08-12 ENCOUNTER — Encounter: Payer: Medicare HMO | Admitting: *Deleted

## 2018-08-17 ENCOUNTER — Encounter: Payer: Medicare HMO | Admitting: *Deleted

## 2018-08-29 ENCOUNTER — Ambulatory Visit (INDEPENDENT_AMBULATORY_CARE_PROVIDER_SITE_OTHER): Payer: Medicare HMO | Admitting: Nurse Practitioner

## 2018-08-29 ENCOUNTER — Other Ambulatory Visit: Payer: Self-pay

## 2018-08-29 ENCOUNTER — Encounter: Payer: Self-pay | Admitting: Nurse Practitioner

## 2018-08-29 DIAGNOSIS — R05 Cough: Secondary | ICD-10-CM | POA: Diagnosis not present

## 2018-08-29 DIAGNOSIS — J301 Allergic rhinitis due to pollen: Secondary | ICD-10-CM

## 2018-08-29 DIAGNOSIS — R059 Cough, unspecified: Secondary | ICD-10-CM

## 2018-08-29 MED ORDER — LORATADINE 10 MG PO TABS: 10.0000 mg | ORAL_TABLET | Freq: Every day | ORAL | 1 refills | Status: DC

## 2018-08-29 MED ORDER — DOXYCYCLINE HYCLATE 100 MG PO TABS: 100.0000 mg | ORAL_TABLET | Freq: Two times a day (BID) | ORAL | 0 refills | Status: DC

## 2018-08-29 NOTE — Progress Notes (Signed)
Patient ID: Karla Maxwell, female   DOB: March 05, 1951, 68 y.o.   MRN: [id number]    Virtual Visit via telephone Note  I connected with Karla Maxwell on 08/29/18 at 2:10 PM by telephone and verified that I am speaking with the correct person using two identifiers. Karla Maxwell is currently located at home  and her husband is currently with her during visit. The provider, Mary-Margaret Hassell Done, FNP is located in their office at time of visit.  I discussed the limitations, risks, security and privacy concerns of performing an evaluation and management service by telephone and the availability of in person appointments. I also discussed with the patient that there may be a patient responsible charge related to this service. The patient expressed understanding and agreed to proceed.   History and Present Illness:   Chief Complaint: Medication Problem   HPI Patient calls in today c/o having bad allergy problems. Claritin was sent to pharmacy and the pharmacy said they never got rx. She says that her allergies are bothering her.  She says she has bad cough with a fever of 100.5 last night. Sputum is greenish now.      Review of Systems  Constitutional: Negative.   HENT: Positive for congestion.   Respiratory: Negative.   Cardiovascular: Negative.   Genitourinary: Negative.   Skin: Negative.   Neurological: Negative.   Psychiatric/Behavioral: Negative.   All other systems reviewed and are negative.    Observations/Objective: Alert and oriented- answers all questions appropriately No distress  Assessment and Plan: Karla Maxwell in today with chief complaint of Medication Problem   1. Non-seasonal allergic rhinitis due to pollen clartin 10mg  1 po daily Wear mask when outside  2. Cough 1. Take meds as prescribed 2. Use a cool mist humidifier especially during the winter months and when heat has been humid. 3. Use saline nose sprays frequently 4. Saline irrigations of the nose  can be very helpful if done frequently.  * 4X daily for 1 week*  * Use of a nettie pot can be helpful with this. Follow directions with this* 5. Drink plenty of fluids 6. Keep thermostat turn down low 7.For any cough or congestion  Use plain Mucinex- regular strength or max strength is fine   * Children- consult with Pharmacist for dosing 8. For fever or aces or pains- take tylenol or ibuprofen appropriate for age and weight.  * for fevers greater than 101 orally you may alternate ibuprofen and tylenol every  3 hours.    - doxycycline (VIBRA-TABS) 100 MG tablet; Take 1 tablet (100 mg total) by mouth 2 (two) times daily. 1 po bid  Dispense: 20 tablet; Refill: 0   Follow Up Instructions: prn    I discussed the assessment and treatment plan with the patient. The patient was provided an opportunity to ask questions and all were answered. The patient agreed with the plan and demonstrated an understanding of the instructions.   The patient was advised to call back or seek an in-person evaluation if the symptoms worsen or if the condition fails to improve as anticipated.  The above assessment and management plan was discussed with the patient. The patient verbalized understanding of and has agreed to the management plan. Patient is aware to call the clinic if symptoms persist or worsen. Patient is aware when to return to the clinic for a follow-up visit. Patient educated on when it is appropriate to go to the emergency department.    I  provided 12 minutes of non-face-to-face time during this encounter.    Mary-Margaret Hassell Done, FNP

## 2018-09-06 ENCOUNTER — Ambulatory Visit (INDEPENDENT_AMBULATORY_CARE_PROVIDER_SITE_OTHER): Payer: Medicare HMO | Admitting: *Deleted

## 2018-09-06 ENCOUNTER — Other Ambulatory Visit: Payer: Self-pay

## 2018-09-06 ENCOUNTER — Encounter: Payer: Self-pay | Admitting: *Deleted

## 2018-09-06 DIAGNOSIS — Z0000 Encounter for general adult medical examination without abnormal findings: Secondary | ICD-10-CM
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
## 2018-09-06 NOTE — Patient Instructions (Signed)
  Karla Maxwell , Thank you for taking time to talk with me for your Medicare Wellness Visit. I appreciate your ongoing commitment to your health goals. Please review the following plan we discussed and let me know if I can assist you in the future.   These are the goals we discussed: Goals    . Exercise 150 min/wk Moderate Activity     Walking is a great option.       Mammogram scheduled for 11/15/2018.   This is a list of the screening recommended for you and due dates:  Health Maintenance  Topic Date Due  . Mammogram  07/24/2018  . Tetanus Vaccine  04/17/2019  . DEXA scan (bone density measurement)  08/05/2019  . Cologuard (Stool DNA test)  08/10/2020  .  Hepatitis C: One time screening is recommended by Center for Disease Control  (CDC) for  adults born from [birth year] through 1965.   Completed  . Pneumonia vaccines  Completed  . Flu Shot  Discontinued

## 2018-09-09 DIAGNOSIS — J449 Chronic obstructive pulmonary disease, unspecified: Secondary | ICD-10-CM | POA: Diagnosis not present

## 2018-10-04 ENCOUNTER — Other Ambulatory Visit: Payer: Self-pay | Admitting: *Deleted

## 2018-10-05 MED ORDER — METFORMIN HCL 500 MG PO TABS: 250.0000 mg | ORAL_TABLET | Freq: Two times a day (BID) | ORAL | 3 refills | Status: DC

## 2018-10-10 DIAGNOSIS — J449 Chronic obstructive pulmonary disease, unspecified: Secondary | ICD-10-CM | POA: Diagnosis not present

## 2018-11-10 ENCOUNTER — Telehealth: Payer: Self-pay | Admitting: Nurse Practitioner

## 2018-11-11 ENCOUNTER — Ambulatory Visit (INDEPENDENT_AMBULATORY_CARE_PROVIDER_SITE_OTHER): Payer: Medicare Other | Admitting: Nurse Practitioner

## 2018-11-11 ENCOUNTER — Other Ambulatory Visit: Payer: Self-pay

## 2018-11-11 ENCOUNTER — Encounter: Payer: Self-pay | Admitting: Nurse Practitioner

## 2018-11-11 VITALS — BP 142/88 | HR 78 | Temp 97.3°F | Ht 61.0 in | Wt 180.0 lb

## 2018-11-11 DIAGNOSIS — E039 Hypothyroidism, unspecified: Secondary | ICD-10-CM | POA: Diagnosis not present

## 2018-11-11 DIAGNOSIS — K219 Gastro-esophageal reflux disease without esophagitis: Secondary | ICD-10-CM | POA: Diagnosis not present

## 2018-11-11 DIAGNOSIS — J4520 Mild intermittent asthma, uncomplicated: Secondary | ICD-10-CM

## 2018-11-11 DIAGNOSIS — I10 Essential (primary) hypertension: Secondary | ICD-10-CM | POA: Diagnosis not present

## 2018-11-11 DIAGNOSIS — R739 Hyperglycemia, unspecified: Secondary | ICD-10-CM | POA: Diagnosis not present

## 2018-11-11 DIAGNOSIS — M8580 Other specified disorders of bone density and structure, unspecified site: Secondary | ICD-10-CM

## 2018-11-11 DIAGNOSIS — E782 Mixed hyperlipidemia: Secondary | ICD-10-CM

## 2018-11-11 DIAGNOSIS — E8881 Metabolic syndrome: Secondary | ICD-10-CM

## 2018-11-11 DIAGNOSIS — Z720 Tobacco use: Secondary | ICD-10-CM

## 2018-11-11 DIAGNOSIS — J449 Chronic obstructive pulmonary disease, unspecified: Secondary | ICD-10-CM | POA: Diagnosis not present

## 2018-11-11 DIAGNOSIS — F330 Major depressive disorder, recurrent, mild: Secondary | ICD-10-CM

## 2018-11-11 DIAGNOSIS — Z6834 Body mass index (BMI) 34.0-34.9, adult: Secondary | ICD-10-CM

## 2018-11-11 LAB — CMP14+EGFR
ALT: 16 IU/L (ref 0–32)
AST: 17 IU/L (ref 0–40)
Albumin/Globulin Ratio: 2.1 (ref 1.2–2.2)
Albumin: 4.4 g/dL (ref 3.8–4.8)
Alkaline Phosphatase: 53 IU/L (ref 39–117)
BUN/Creatinine Ratio: 16 (ref 12–28)
BUN: 12 mg/dL (ref 8–27)
Bilirubin Total: 0.4 mg/dL (ref 0.0–1.2)
CO2: 21 mmol/L (ref 20–29)
Calcium: 10.2 mg/dL (ref 8.7–10.3)
Chloride: 102 mmol/L (ref 96–106)
Creatinine, Ser: 0.74 mg/dL (ref 0.57–1.00)
GFR calc Af Amer: 96 mL/min/{1.73_m2} (ref >59)
GFR calc non Af Amer: 84 mL/min/{1.73_m2} (ref >59)
Globulin, Total: 2.1 g/dL (ref 1.5–4.5)
Glucose: 105 mg/dL — ABNORMAL HIGH (ref 65–99)
Potassium: 4.3 mmol/L (ref 3.5–5.2)
Sodium: 141 mmol/L (ref 134–144)
Total Protein: 6.5 g/dL (ref 6.0–8.5)

## 2018-11-11 LAB — LIPID PANEL
Chol/HDL Ratio: 3.1 ratio (ref 0.0–4.4)
Cholesterol, Total: 130 mg/dL (ref 100–199)
HDL: 42 mg/dL (ref >39)
LDL Calculated: 51 mg/dL (ref 0–99)
Triglycerides: 187 mg/dL — ABNORMAL HIGH (ref 0–149)
VLDL Cholesterol Cal: 37 mg/dL (ref 5–40)

## 2018-11-11 LAB — BAYER DCA HB A1C WAIVED: HB A1C (BAYER DCA - WAIVED): 5.9 % (ref <7.0)

## 2018-11-11 MED ORDER — OMEPRAZOLE 40 MG PO CPDR: 40.0000 mg | DELAYED_RELEASE_CAPSULE | Freq: Every day | ORAL | 1 refills | Status: DC

## 2018-11-11 MED ORDER — MONTELUKAST SODIUM 10 MG PO TABS: 10.0000 mg | ORAL_TABLET | Freq: Every day | ORAL | 1 refills | Status: DC

## 2018-11-11 MED ORDER — METFORMIN HCL 500 MG PO TABS: 250.0000 mg | ORAL_TABLET | Freq: Two times a day (BID) | ORAL | 1 refills | Status: DC

## 2018-11-11 MED ORDER — LEVOTHYROXINE SODIUM 75 MCG PO TABS: ORAL_TABLET | ORAL | 1 refills | Status: DC

## 2018-11-11 MED ORDER — BUDESONIDE-FORMOTEROL FUMARATE 160-4.5 MCG/ACT IN AERO: INHALATION_SPRAY | RESPIRATORY_TRACT | 1 refills | Status: DC

## 2018-11-11 MED ORDER — FENOFIBRATE MICRONIZED 134 MG PO CAPS: ORAL_CAPSULE | ORAL | 1 refills | Status: DC

## 2018-11-11 MED ORDER — ATORVASTATIN CALCIUM 80 MG PO TABS: 80.0000 mg | ORAL_TABLET | Freq: Every day | ORAL | 1 refills | Status: DC

## 2018-11-11 NOTE — Patient Instructions (Signed)
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
## 2018-11-13 LAB — THYROID PANEL WITH TSH
Free Thyroxine Index: 2.9 (ref 1.2–4.9)
T3 Uptake Ratio: 26 % (ref 24–39)
T4, Total: 11 ug/dL (ref 4.5–12.0)
TSH: 1.37 u[IU]/mL (ref 0.450–4.500)

## 2018-11-13 LAB — SPECIMEN STATUS REPORT

## 2018-11-15 ENCOUNTER — Telehealth: Payer: Self-pay | Admitting: Nurse Practitioner

## 2018-11-15 NOTE — Chronic Care Management (AMB) (Signed)
Chronic Care Management   Note  11/15/2018 Name: Karla Maxwell MRN: [id number] DOB: [birth year]  Karla Maxwell is a 68 y.o. year old female who is a primary care patient of Chevis Pretty, FNP. I reached out to [name] by phone today in response to a referral sent by [name]'s health plan.    [name] was given information about Chronic Care Management services today including:  1. CCM service includes personalized support from designated clinical staff supervised by her physician, including individualized plan of care and coordination with other care providers 2. 24/7 contact phone numbers for assistance for urgent and routine care needs. 3. Service will only be billed when office clinical staff spend 20 minutes or more in a month to coordinate care. 4. Only one practitioner may furnish and bill the service in a calendar month. 5. The patient may stop CCM services at any time (effective at the end of the month) by phone call to the office staff. 6. The patient will be responsible for cost sharing (co-pay) of up to 20% of the service fee (after annual deductible is met).  Patient agreed to services and verbal consent obtained.   Follow up plan: Telephone appointment with CCM team member scheduled for: 11/24/2018  Ione  [email or web address]   ??[phone]

## 2018-11-24 ENCOUNTER — Ambulatory Visit: Payer: Medicare Other | Admitting: *Deleted

## 2018-11-24 DIAGNOSIS — Z720 Tobacco use: Secondary | ICD-10-CM

## 2018-11-24 DIAGNOSIS — I10 Essential (primary) hypertension: Secondary | ICD-10-CM

## 2018-11-24 DIAGNOSIS — J449 Chronic obstructive pulmonary disease, unspecified: Secondary | ICD-10-CM

## 2018-12-03 MED ORDER — LORATADINE 10 MG PO TABS: 10.0000 mg | ORAL_TABLET | Freq: Every day | ORAL | 1 refills | Status: DC

## 2018-12-03 MED ORDER — FLUTICASONE PROPIONATE 50 MCG/ACT NA SUSP: 2.0000 | Freq: Every day | NASAL | 6 refills | Status: DC

## 2018-12-09 ENCOUNTER — Ambulatory Visit: Payer: Medicare Other | Admitting: *Deleted

## 2018-12-09 DIAGNOSIS — J449 Chronic obstructive pulmonary disease, unspecified: Secondary | ICD-10-CM

## 2018-12-09 DIAGNOSIS — I10 Essential (primary) hypertension: Secondary | ICD-10-CM

## 2018-12-09 DIAGNOSIS — Z720 Tobacco use: Secondary | ICD-10-CM

## 2018-12-09 NOTE — Chronic Care Management (AMB) (Addendum)
  Chronic Care Management   Initial Visit Note  11/24/2018 Name: Karla Maxwell MRN: [id number] DOB: Apr 14, 1951  Referred by: Karla Pretty, FNP Reason for referral : Chronic Care Management   Karla Maxwell is a 68 y.o. year old female who is a primary care patient of Karla Maxwell, [place]. The CCM team was consulted for assistance with chronic disease management and care coordination needs.   Review of patient status, including review of consultants reports, relevant laboratory and other test results, and collaboration with appropriate care team members and the patient's provider was performed as part of comprehensive patient evaluation and provision of chronic care management services.     SDOH (Social Determinants of Health) screening performed today. See Care Plan Entry related to challenges with: Tobacco Use Physical Activity  Subjective: "I wouldn't mind some help with my medical conditions". I spoke with [name] by telephone today. She would like to work with the CCM team and her primary concerns right now are bilateral lower leg pain with walking and pain in the left side of her back and left hip. She takes Tylenol arthritis for pain relief. She has has hypertension and COPD. She has a large oxygen concentrator that she doesn't use and would like for [facility] to pickup. States that she needs and order to discontinue it faxed to [phone].  Objective:   BP Readings from Last 3 Encounters:  11/11/18 (!) 142/88  06/23/18 (!) 148/72  06/11/18 (!) 150/72   Wt Readings from Last 3 Encounters:  11/11/18 180 lb (81.6 kg)  06/23/18 182 lb (82.6 kg)  06/10/18 192 lb 14.4 oz (87.5 kg)   BMI Readings from Last 3 Encounters:  11/11/18 34.01 kg/m  06/23/18 34.39 kg/m  06/10/18 35.86 kg/m    Assessment:  Goals Addressed            This Visit's Progress     Patient Stated   . "I would like to work with the nurse to improve my health" (pt-stated)        Current Barriers:  . Chronic Disease Management support and education needs related to COPD and Hypertension  Nurse Case Manager Clinical Goal(s):  Marland Kitchen Over the next 30 days, patient will meet with RN Care Manager to address COPD and Hypertension and set goals for ech  Interventions:  . Evaluation of current treatment plan related to hypertension and COPD and patient's adherence to plan as established by provider. . Discussed plans with patient for ongoing care management follow up and provided patient with direct contact information for care management team  Patient Self Care Activities:  . Performs ADL's independently . Performs IADL's independently  Initial goal documentation          Plan:  RNCM will reach out to PCP regarding order to discontinue O2 and for [facility] to pick up the concentrator.   RNCM will f/u with patient over the next 2 weeks to discuss HTN management, COPD, and back/leg pain.   [name] BSN, RN-BC Embedded Chronic Care Manager [facility] / [place] Management Direct Dial: [phone]   "I have reviewed this encounter including the documentation in this note and/or discussed this patient with the nurse coordinator, [name], RN . I am certifying that I agree with the content of this note as supervising physician." [place], FNP

## 2018-12-09 NOTE — Chronic Care Management (AMB) (Addendum)
  Chronic Care Management   Care Coordination Note  12/09/2018 Name: Karla Maxwell MRN: [id number] DOB: [birth year]  Order to discontinue oxygen and have it picked up by [facility]. Will have PCP sign it when she returns to the office on Monday and I will fax it to them.   Follow up plan: RNCM to fax order to [facility] over the next 5 days RNCM to follow up with patient over the next 14 days by telephone regarding current goals    [name] BSN, RN-BC [place] / [place]: [phone]   "I have reviewed this encounter including the documentation in this note and/or discussed this patient with the nurse coordinator, [name], RN . I am certifying that I agree with the content of this note as supervising physician." [place], FNP

## 2018-12-10 DIAGNOSIS — J449 Chronic obstructive pulmonary disease, unspecified: Secondary | ICD-10-CM | POA: Diagnosis not present

## 2018-12-13 ENCOUNTER — Ambulatory Visit: Payer: Self-pay | Admitting: *Deleted

## 2018-12-13 DIAGNOSIS — I10 Essential (primary) hypertension: Secondary | ICD-10-CM

## 2018-12-13 DIAGNOSIS — J449 Chronic obstructive pulmonary disease, unspecified: Secondary | ICD-10-CM

## 2018-12-13 NOTE — Patient Instructions (Signed)
Visit Information  Goals Addressed            This Visit's Progress     Patient Stated   . COMPLETED: I would like to have this oxygen concentrator picked up" (pt-stated)       Current Barriers:  . None  Nurse Case Manager Clinical Goal(s):  Marland Kitchen Over the next 30 days, [facility] will pick up unused oxygen concentrator from patient's home.   Interventions:  . Collaborated with Chevis Pretty, FNP regarding order to d/c oxygen  . Faxed signed order to d/c oxygen and pickup concentrator to [phone].  Patient Self Care Activities:  . Performs ADL's independently . Performs IADL's independently  Please see past updates related to this goal by clicking on the "Past Updates" button in the selected goal         The patient verbalized understanding of instructions provided today and declined a print copy of patient instruction materials.   The care management team will reach out to the patient again over the next 30 days.    [name] BSN, RN-BC Embedded Chronic Care Manager [facility] / [place] Management Direct Dial: [phone]

## 2018-12-13 NOTE — Chronic Care Management (AMB) (Addendum)
  Chronic Care Management   Care Coordination Note  12/13/2018 Name: ROSSETTA KAMA MRN: [id number] DOB: 06-Feb-1951  Care coordination with PCP regarding home oxygen discontinuation.   Goals Addressed      Patient Stated   . COMPLETED: I would like to have this oxygen concentrator picked up" (pt-stated)       Current Barriers:  . None  Nurse Case Manager Clinical Goal(s):  Marland Kitchen Over the next 30 days, [facility] will pick up unused oxygen concentrator from patient's home.   Interventions:  . Collaborated with Chevis Pretty, FNP regarding order to d/c oxygen  . Faxed signed order to d/c oxygen and pickup concentrator to [phone].  Patient Self Care Activities:  . Performs ADL's independently . Performs IADL's independently  Please see past updates related to this goal by clicking on the "Past Updates" button in the selected goal          Follow up plan: The care management team will reach out to the patient again over the next 30 days.   [name] BSN, RN-BC Embedded Chronic Care Manager [facility] / [place] Management Direct Dial: [phone]   "I have reviewed this encounter including the documentation in this note and/or discussed this patient with the nurse coordinator, [name], RN . I am certifying that I agree with the content of this note as supervising physician." [place], FNP

## 2018-12-22 ENCOUNTER — Telehealth: Payer: Medicare Other

## 2018-12-27 ENCOUNTER — Ambulatory Visit: Payer: Medicare Other | Admitting: *Deleted

## 2018-12-27 DIAGNOSIS — J449 Chronic obstructive pulmonary disease, unspecified: Secondary | ICD-10-CM

## 2019-01-02 NOTE — Chronic Care Management (AMB) (Addendum)
Chronic Care Management   Follow Up Note   01/02/2019 Name: LEYLA SOLIZ MRN: [id number] DOB: 03/19/1951  Referred by: Chevis Pretty, FNP Reason for referral : Chronic Care Management (RNCM follow up)   ESLI CLEMENTS is a 68 y.o. year old female who is a primary care patient of Chevis Pretty, [place]. The CCM team was consulted for assistance with chronic disease management and care coordination needs.    Review of patient status, including review of consultants reports, relevant laboratory and other test results, and collaboration with appropriate care team members and the patient's provider was performed as part of comprehensive patient evaluation and provision of chronic care management services.    SDOH (Social Determinants of Health) screening performed today: Tobacco Use Physical Activity. See Care Plan for related entries.   Outpatient Encounter Medications as of 12/27/2018  Medication Sig Note  . aspirin EC 81 MG tablet Take 162 mg by mouth daily.   Marland Kitchen atorvastatin (LIPITOR) 80 MG tablet Take 1 tablet (80 mg total) by mouth daily.   . blood glucose meter kit and supplies KIT Dispense based on patient and insurance preference. Use up to four times daily as directed. (FOR ICD-9 250.00, 250.01).   . budesonide-formoterol (SYMBICORT) 160-4.5 MCG/ACT inhaler INHALE 2 PUFFS BY MOUTH INTO  THE LUNGS TWICE A DAY AS INSTRUCTED 11/24/2018: Has Spiriva on hand and is using it until it runs out and then will restart Symbicort.   . fenofibrate micronized (LOFIBRA) 134 MG capsule TAKE ONE CAPSULE BY MOUTH ONE TIME DAILY BEFORE BREAKFAST   . fluticasone (FLONASE) 50 MCG/ACT nasal spray Place 2 sprays into both nostrils daily.   Marland Kitchen levothyroxine (SYNTHROID) 75 MCG tablet TAKE 1 TABLET (75 MCG TOTAL)   BY MOUTH DAILY.   Marland Kitchen loratadine (CLARITIN) 10 MG tablet Take 1 tablet (10 mg total) by mouth daily.   . montelukast (SINGULAIR) 10 MG tablet Take 1 tablet (10 mg total) by mouth daily with  breakfast.   . omeprazole (PRILOSEC) 40 MG capsule Take 1 capsule (40 mg total) by mouth daily.    No facility-administered encounter medications on file as of 12/27/2018.      Goals Addressed            This Visit's Progress     Patient Stated   . "I want to keep my COPD under control" (pt-stated)       Current Barriers:  . Chronic Disease Management support and education needs related to COPD  Nurse Case Manager Clinical Goal(s):  Marland Kitchen Over the next 30 days, patient will work with Consulting civil engineer to address needs related to COPD management  Interventions:  . Evaluation of current treatment plan related to COPD and patient's adherence to plan as established by provider. . Advised patient to continue current medications . Reviewed medications with patient and discussed Symbicort and Singulair  Patient Self Care Activities:  . Performs IADL's independently . Calls provider office for new concerns or questions  Initial goal documentation         The care management team will reach out to the patient again over the next 30 days.    [name] BSN, RN-BC Embedded Chronic Care Manager [facility] / [place] Management Direct Dial: [phone]   "I have reviewed this encounter including the documentation in this note and/or discussed this patient with the nurse coordinator, [name], RN . I am certifying that I agree with the content of this note as supervising  physician." Mary-Margaret Hassell Done, FNP

## 2019-01-13 ENCOUNTER — Ambulatory Visit: Payer: Medicare Other

## 2019-01-13 DIAGNOSIS — I10 Essential (primary) hypertension: Secondary | ICD-10-CM

## 2019-01-13 DIAGNOSIS — J449 Chronic obstructive pulmonary disease, unspecified: Secondary | ICD-10-CM

## 2019-01-13 NOTE — Patient Instructions (Signed)
Visit Information  Goals Addressed            This Visit's Progress     Patient Stated   . "I want my legs to stop hurting when I walk" (pt-stated)       Current Barriers:  . Film/video editor.  . Chronic Disease Management support and education needs related to leg pain and heaviness with ambulation  Nurse Case Manager Clinical Goal(s):  Marland Kitchen Over the next 30 days, patient will talk with PCP regarding lower leg pain with walking . Over the next 30 days, patient will verbalize understanding of plan to manage claudication  Interventions:  . Discussed HPI o Bilateral lower leg pain and heaviness with walking that improves with rest .   Patient Self Care Activities:  . Performs ADL's independently . Performs IADL's independently  Initial goal documentation     . "I want to keep my COPD under control" (pt-stated)       Current Barriers:  . Chronic Disease Management support and education needs related to COPD  Nurse Case Manager Clinical Goal(s):  Marland Kitchen Over the next 30 days, patient will work with Consulting civil engineer to address needs related to COPD management  Interventions:  . Evaluation of current treatment plan related to COPD and patient's adherence to plan as established by provider. . Advised patient to continue current medications . Reviewed medications with patient and discussed Symbicort and Singulair  Patient Self Care Activities:  . Performs IADL's independently . Calls provider office for new concerns or questions  Initial goal documentation        The patient verbalized understanding of instructions provided today and declined a print copy of patient instruction materials.   The care management team will reach out to the patient again over the next 30 days.    [name] BSN, RN-BC Embedded Chronic Care Manager [facility] / [place] Management Direct Dial: [phone]

## 2019-01-13 NOTE — Patient Instructions (Addendum)
Visit Information  Goals Addressed            This Visit's Progress     Patient Stated   . "I want my legs to stop hurting when I walk" (pt-stated)       Current Barriers:  . Film/video editor.  . Chronic Disease Management support and education needs related to leg pain and heaviness with ambulation  Nurse Case Manager Clinical Goal(s):  Marland Kitchen Over the next 30 days, patient will talk with PCP regarding lower leg pain with walking . Over the next 30 days, patient will verbalize understanding of plan to manage claudication  Interventions:  . Chart Reviewed . Discussed HPI o Bilateral lower leg pain and heaviness with walking that improves with rest . Printed educational materials on PAD and claudication prepared . Encouraged to increase walking to improve circulation  Patient Self Care Activities:  . Performs ADL's independently . Performs IADL's independently  Please see past updates related to this goal by clicking on the "Past Updates" button in the selected goal      . "I want to keep my COPD under control" (pt-stated)       Current Barriers:  . Chronic Disease Management support and education needs related to COPD  Nurse Case Manager Clinical Goal(s):  Marland Kitchen Over the next 60 days, the patient will demonstrate ongoing self health care management ability as evidenced by her ability to follow the COPD Action Plan with the intention of preventing symptoms from progressing.*  Interventions:  . Provided patient with printed educational materials related to COPD Action Plan . Advised to follow COPD Action Plan . Covid precautions discussed o For information about COVID-19 or "Corona Virus", the following web resources may be helpful:  o CDC: BeginnerSteps.be   o [place]: InsuranceIntern.se . Chart Reviewed o Hospitalization in 06/2018 for  pneumonia o Pneumovax 1998 & 2018, Prevnar 2017 o Historically does not get the flu vaccine o Current smoker, 1/4 PPD  Patient Self Care Activities:  . Performs IADL's independently . Calls provider office for new concerns or questions  Please see past updates related to this goal by clicking on the "Past Updates" button in the selected goal      . COMPLETED: "I would like to work with the nurse to improve my health" (pt-stated)       Current Barriers:  . Chronic Disease Management support and education needs related to COPD and Hypertension  Nurse Case Manager Clinical Goal(s):  Marland Kitchen Over the next 30 days, patient will meet with RN Care Manager to address COPD and Hypertension and set goals for ech  Interventions:  . Evaluation of current treatment plan related to hypertension and COPD and patient's adherence to plan as established by provider. . Discussed plans with patient for ongoing care management follow up and provided patient with direct contact information for care management team  Patient Self Care Activities:  . Performs ADL's independently . Performs IADL's independently  Initial goal documentation  Disease specific goals created         Print copy of patient instructions provided.   The care management team will reach out to the patient again over the next 60 days.    COPD Self-Health Management Activities:   1. Understand  your COPD diagnosis and symptoms  Chronic Obstrutive Pulmonary Disease (COPD) Definition: Different groups of lung diseases that make it difficult to breathe due to a blocked airflow. Smoking can lead to a chronic bronchitis and cause the airways  to become red, swollen, and inflamed. This makes the airways narrow making it harder for air to move in and out of the lungs. After a while, this causes extra mucus, which clogs the airway. COPD can't be reversed; however, there are certain treatments that can help reduce the symptoms that may cause  further damage.   Some COPD signs/symptoms:  . Shortness of breath that may increase with activities . Wheezing and tightness in the chest area . Chronic cough that may cause mucus (sputum) production that may be clear, yellow, or greenish in color . Lips and/or fingernails turn blue or gray in color . Frequent lung infections   Things you can do: Marland Kitchen Avoid smoke and air pollution . Exercise on a regular basis . Keep your airway clear from mucus build up  . Control your cough by drinking plenty of water . Use a cool mist humidifier, if needed . Visit your doctor on a regular basis . Receive your annual flu vaccine . Receive your pneumonia vaccine    2. Know which medicines are used to manage your COPD and take them every day   Name of Medication When I take this medication  Symbicort 2 puffs in the morning & 2 puffs in the evening  Singulair daily         3. Document your zone each day and know your COPD Action Plan   COPD ACTION PLAN Actions to Take if My Symptoms Get Worse   WHAT ZONE ARE YOU IN TODAY? Green, Yellow, or Red?    GREEN ZONE: I am doing well today.   Symptoms Actions .  Usual activity and exercise level . Usual amounts of cough or phlegm/musuc . Sleep well at night . Appetite is good . Continue to take daily "maintenance" medicines (the ones you take "no matter what" until your doctor adjusts them for you) . Use oxygen as prescribed . Continue regular exercise/diet plan . At all times avoid cigarette smoke, inhaled iritants     YELLOW ZONE: I am having a bad day or a COPD flare.  Symptoms Actions .  More breathless than usual . I have less energy for my daily activities . Increased or thicker phlegm/mucus . Using quick relief inhaler/nebulizer more often . More coughing than usual . I feel like I have a "chest cold" . Poor sleep and my symptoms woke me up . My appetite is not good . My medicine is not helping . Swelling of ankles more than usual  . Continue daily medications . Use quick relief inhaler or nebulizer every 4 hours . Use oxygen as prescribed . Get plenty of rest . Use pursed lip breathing . At all times avoid cigarette smoke, inhaled irritants.  Marland Kitchen CALL PROVIDER for same day or next day appointment for the following:  o If symptoms are not improving within 48 hours of onset or o If symptoms are not improving after quick relief inhaler or nebulizer o Start an oral corticosteroid (specify name, dose, and duration) . Start an antibiotic (specify name, dose, duration)    RED ZONE: I NEED URGENT MEDICAL CARE  Symptoms Actions .  Severe shortness of breath even at rest . Severe shortness of breath even after quick relief inhaler or nebulizer . Not able to lay flat because of breathing . Fever or shaking chills . Feeling confused or very drowsy . Chest pains . Coughin up blood . Quick relief inhaler or nebulizer not effective . Call 911 or have someone  take you to the nearest emergency room . Call provider for now or same day appointment     3-2-1 PLAN: If any of the 3 main COPD symptoms (Cough, Shortness of Breath, or Mucus Production) change for 2 days or more, your number 1 priority if to call your doctor.   Your Provider: Chevis Pretty, [place] Phone:  [phone] (someone is available 24/7)  4. Record and keep your provider appointments  Provider Name Appointment Date/Time  Chevis Pretty, FNP  May 16, 2019 at [place], RN-BC [place] / [place] Management Direct Dial: [phone]

## 2019-01-24 ENCOUNTER — Telehealth: Payer: Self-pay | Admitting: Nurse Practitioner

## 2019-01-24 DIAGNOSIS — I10 Essential (primary) hypertension: Secondary | ICD-10-CM

## 2019-01-24 MED ORDER — AMLODIPINE BESYLATE 10 MG PO TABS: 10.0000 mg | ORAL_TABLET | Freq: Every day | ORAL | 3 refills | Status: DC

## 2019-01-24 NOTE — Telephone Encounter (Signed)
Patient aware.

## 2019-01-24 NOTE — Telephone Encounter (Signed)
We increased her to amlodipine 5mg  2 daily in January due to elevated blood pressure- I will send in new rx for amlodipine 10mg  1 tablet daily Also metformin is only on chart to be taken if blood sugars go up . Her last hgba1c was 5.8% so she can stop for now. If blood sugars go up she will have to start back taking it again.

## 2019-01-24 NOTE — Telephone Encounter (Signed)
Patient states that insurance will not cover amlodipine at 5mg  two tablets daily.  Amlodipine not on current med list.  Patient also wants to know why she was put back on metformin

## 2019-03-06 NOTE — Progress Notes (Signed)
CCM note

## 2019-04-27 ENCOUNTER — Other Ambulatory Visit: Payer: Self-pay

## 2019-04-27 NOTE — Patient Outreach (Signed)
[facility]) Care Management  04/27/2019  JENSINE WATERHOUSE October 29, 1950 [id number]   Medication Adherence call to Mrs. Tressa Busman Telephone call to Patient regarding Medication Adherence unable to reach patient Patients phone is busy all the time not sure if correct number. Mrs. Mallery is showing past due on Metformin 500 mg under [facility].   [place] Management Direct Dial [phone]  Fax [phone] [email or web address]

## 2019-05-15 ENCOUNTER — Other Ambulatory Visit: Payer: Self-pay

## 2019-05-16 ENCOUNTER — Encounter: Payer: Self-pay | Admitting: Nurse Practitioner

## 2019-05-16 ENCOUNTER — Other Ambulatory Visit: Payer: Self-pay

## 2019-05-16 ENCOUNTER — Ambulatory Visit (INDEPENDENT_AMBULATORY_CARE_PROVIDER_SITE_OTHER): Payer: Medicare Other | Admitting: Nurse Practitioner

## 2019-05-16 VITALS — BP 134/68 | HR 72 | Temp 97.8°F | Resp 20 | Ht 61.0 in | Wt 169.0 lb

## 2019-05-16 DIAGNOSIS — K219 Gastro-esophageal reflux disease without esophagitis: Secondary | ICD-10-CM

## 2019-05-16 DIAGNOSIS — R7309 Other abnormal glucose: Secondary | ICD-10-CM | POA: Diagnosis not present

## 2019-05-16 DIAGNOSIS — E8881 Metabolic syndrome: Secondary | ICD-10-CM

## 2019-05-16 DIAGNOSIS — E782 Mixed hyperlipidemia: Secondary | ICD-10-CM | POA: Diagnosis not present

## 2019-05-16 DIAGNOSIS — I10 Essential (primary) hypertension: Secondary | ICD-10-CM | POA: Diagnosis not present

## 2019-05-16 DIAGNOSIS — Z720 Tobacco use: Secondary | ICD-10-CM

## 2019-05-16 DIAGNOSIS — E039 Hypothyroidism, unspecified: Secondary | ICD-10-CM | POA: Diagnosis not present

## 2019-05-16 DIAGNOSIS — Z6831 Body mass index (BMI) 31.0-31.9, adult: Secondary | ICD-10-CM

## 2019-05-16 DIAGNOSIS — J449 Chronic obstructive pulmonary disease, unspecified: Secondary | ICD-10-CM | POA: Diagnosis not present

## 2019-05-16 DIAGNOSIS — F330 Major depressive disorder, recurrent, mild: Secondary | ICD-10-CM

## 2019-05-16 LAB — BAYER DCA HB A1C WAIVED: HB A1C (BAYER DCA - WAIVED): 5.7 % (ref <7.0)

## 2019-05-16 MED ORDER — LORATADINE 10 MG PO TABS: 10.0000 mg | ORAL_TABLET | Freq: Every day | ORAL | 1 refills | Status: DC

## 2019-05-16 MED ORDER — BUDESONIDE-FORMOTEROL FUMARATE 160-4.5 MCG/ACT IN AERO: INHALATION_SPRAY | RESPIRATORY_TRACT | 1 refills | Status: DC

## 2019-05-16 MED ORDER — OMEPRAZOLE 40 MG PO CPDR: 40.0000 mg | DELAYED_RELEASE_CAPSULE | Freq: Every day | ORAL | 1 refills | Status: DC

## 2019-05-16 MED ORDER — AMLODIPINE BESYLATE 10 MG PO TABS: 10.0000 mg | ORAL_TABLET | Freq: Every day | ORAL | 1 refills | Status: DC

## 2019-05-16 MED ORDER — ATORVASTATIN CALCIUM 80 MG PO TABS: 80.0000 mg | ORAL_TABLET | Freq: Every day | ORAL | 1 refills | Status: DC

## 2019-05-16 MED ORDER — MONTELUKAST SODIUM 10 MG PO TABS: 10.0000 mg | ORAL_TABLET | Freq: Every day | ORAL | 1 refills | Status: DC

## 2019-05-16 MED ORDER — LEVOTHYROXINE SODIUM 75 MCG PO TABS: ORAL_TABLET | ORAL | 1 refills | Status: DC

## 2019-05-16 MED ORDER — FENOFIBRATE MICRONIZED 134 MG PO CAPS: ORAL_CAPSULE | ORAL | 1 refills | Status: DC
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
## 2019-05-16 NOTE — Patient Instructions (Signed)
Steps to Quit Smoking Smoking tobacco is the leading cause of preventable death. It can affect almost every organ in the body. Smoking puts you and people around you at risk for many serious, long-lasting (chronic) diseases. Quitting smoking can be hard, but it is one of the best things that you can do for your health. It is never too late to quit. How do I get ready to quit? When you decide to quit smoking, make a plan to help you succeed. Before you quit:  Pick a date to quit. Set a date within the next 2 weeks to give you time to prepare.  Write down the reasons why you are quitting. Keep this list in places where you will see it often.  Tell your family, friends, and co-workers that you are quitting. Their support is important.  Talk with your doctor about the choices that may help you quit.  Find out if your health insurance will pay for these treatments.  Know the people, places, things, and activities that make you want to smoke (triggers). Avoid them. What first steps can I take to quit smoking?  Throw away all cigarettes at home, at work, and in your car.  Throw away the things that you use when you smoke, such as ashtrays and lighters.  Clean your car. Make sure to empty the ashtray.  Clean your home, including curtains and carpets. What can I do to help me quit smoking? Talk with your doctor about taking medicines and seeing a counselor at the same time. You are more likely to succeed when you do both.  If you are pregnant or breastfeeding, talk with your doctor about counseling or other ways to quit smoking. Do not take medicine to help you quit smoking unless your doctor tells you to do so. To quit smoking: Quit right away  Quit smoking totally, instead of slowly cutting back on how much you smoke over a period of time.  Go to counseling. You are more likely to quit if you go to counseling sessions regularly. Take medicine You may take medicines to help you quit. Some  medicines need a prescription, and some you can buy over-the-counter. Some medicines may contain a drug called nicotine to replace the nicotine in cigarettes. Medicines may:  Help you to stop having the desire to smoke (cravings).  Help to stop the problems that come when you stop smoking (withdrawal symptoms). Your doctor may ask you to use:  Nicotine patches, gum, or lozenges.  Nicotine inhalers or sprays.  Non-nicotine medicine that is taken by mouth. Find resources Find resources and other ways to help you quit smoking and remain smoke-free after you quit. These resources are most helpful when you use them often. They include:  Online chats with a counselor.  Phone quitlines.  Printed self-help materials.  Support groups or group counseling.  Text messaging programs.  Mobile phone apps. Use apps on your mobile phone or tablet that can help you stick to your quit plan. There are many free apps for mobile phones and tablets as well as websites. Examples include Quit Guide from the CDC and smokefree.gov  What things can I do to make it easier to quit?   Talk to your family and friends. Ask them to support and encourage you.  Call a phone quitline (1-800-QUIT-NOW), reach out to support groups, or work with a counselor.  Ask people who smoke to not smoke around you.  Avoid places that make you want to smoke,   such as: ? Bars. ? Parties. ? Smoke-break areas at work.  Spend time with people who do not smoke.  Lower the stress in your life. Stress can make you want to smoke. Try these things to help your stress: ? Getting regular exercise. ? Doing deep-breathing exercises. ? Doing yoga. ? Meditating. ? Doing a body scan. To do this, close your eyes, focus on one area of your body at a time from head to toe. Notice which parts of your body are tense. Try to relax the muscles in those areas. How will I feel when I quit smoking? Day 1 to 3 weeks Within the first 24 hours,  you may start to have some problems that come from quitting tobacco. These problems are very bad 2-3 days after you quit, but they do not often last for more than 2-3 weeks. You may get these symptoms:  Mood swings.  Feeling restless, nervous, angry, or annoyed.  Trouble concentrating.  Dizziness.  Strong desire for high-sugar foods and nicotine.  Weight gain.  Trouble pooping (constipation).  Feeling like you may vomit (nausea).  Coughing or a sore throat.  Changes in how the medicines that you take for other issues work in your body.  Depression.  Trouble sleeping (insomnia). Week 3 and afterward After the first 2-3 weeks of quitting, you may start to notice more positive results, such as:  Better sense of smell and taste.  Less coughing and sore throat.  Slower heart rate.  Lower blood pressure.  Clearer skin.  Better breathing.  Fewer sick days. Quitting smoking can be hard. Do not give up if you fail the first time. Some people need to try a few times before they succeed. Do your best to stick to your quit plan, and talk with your doctor if you have any questions or concerns. Summary  Smoking tobacco is the leading cause of preventable death. Quitting smoking can be hard, but it is one of the best things that you can do for your health.  When you decide to quit smoking, make a plan to help you succeed.  Quit smoking right away, not slowly over a period of time.  When you start quitting, seek help from your doctor, family, or friends. This information is not intended to replace advice given to you by your health care provider. Make sure you discuss any questions you have with your health care provider. Document Revised: 01/13/2019 Document Reviewed: 07/09/2018 Elsevier Patient Education  2020 [employer] Inc.  

## 2019-05-17 LAB — THYROID PANEL WITH TSH
Free Thyroxine Index: 2.3 (ref 1.2–4.9)
T3 Uptake Ratio: 24 % (ref 24–39)
T4, Total: 9.4 ug/dL (ref 4.5–12.0)
TSH: 1.2 u[IU]/mL (ref 0.450–4.500)

## 2019-05-17 LAB — CMP14+EGFR
ALT: 13 IU/L (ref 0–32)
AST: 15 IU/L (ref 0–40)
Albumin/Globulin Ratio: 2 (ref 1.2–2.2)
Albumin: 4.5 g/dL (ref 3.8–4.8)
Alkaline Phosphatase: 55 IU/L (ref 39–117)
BUN/Creatinine Ratio: 17 (ref 12–28)
BUN: 12 mg/dL (ref 8–27)
Bilirubin Total: 0.3 mg/dL (ref 0.0–1.2)
CO2: 19 mmol/L — ABNORMAL LOW (ref 20–29)
Calcium: 10.2 mg/dL (ref 8.7–10.3)
Chloride: 101 mmol/L (ref 96–106)
Creatinine, Ser: 0.72 mg/dL (ref 0.57–1.00)
GFR calc Af Amer: 100 mL/min/{1.73_m2} (ref >59)
GFR calc non Af Amer: 86 mL/min/{1.73_m2} (ref >59)
Globulin, Total: 2.2 g/dL (ref 1.5–4.5)
Glucose: 95 mg/dL (ref 65–99)
Potassium: 4.3 mmol/L (ref 3.5–5.2)
Sodium: 140 mmol/L (ref 134–144)
Total Protein: 6.7 g/dL (ref 6.0–8.5)

## 2019-05-17 LAB — LIPID PANEL
Chol/HDL Ratio: 3.2 ratio (ref 0.0–4.4)
Cholesterol, Total: 133 mg/dL (ref 100–199)
HDL: 42 mg/dL (ref >39)
LDL Chol Calc (NIH): 65 mg/dL (ref 0–99)
Triglycerides: 148 mg/dL (ref 0–149)
VLDL Cholesterol Cal: 26 mg/dL (ref 5–40)

## 2019-06-01 ENCOUNTER — Encounter: Payer: Self-pay | Admitting: Nurse Practitioner

## 2019-06-01 ENCOUNTER — Ambulatory Visit (INDEPENDENT_AMBULATORY_CARE_PROVIDER_SITE_OTHER): Payer: Medicare Other | Admitting: Nurse Practitioner

## 2019-06-01 DIAGNOSIS — M5432 Sciatica, left side: Secondary | ICD-10-CM

## 2019-06-01 MED ORDER — PREDNISONE 10 MG (21) PO TBPK: ORAL_TABLET | ORAL | 0 refills | Status: DC

## 2019-06-01 NOTE — Progress Notes (Signed)
Virtual Visit via telephone Note Due to COVID-19 pandemic this visit was conducted virtually. This visit type was conducted due to national recommendations for restrictions regarding the COVID-19 Pandemic (e.g. social distancing, sheltering in place) in an effort to limit this patient's exposure and mitigate transmission in our community. All issues noted in this document were discussed and addressed.  A physical exam was not performed with this format.  I connected with Karla Maxwell on 06/01/19 at 9:45 by telephone and verified that I am speaking with the correct person using two identifiers. Karla Maxwell is currently located at home and her husband is currently with her during visit. The provider, Mary-Margaret Hassell Done, FNP is located in their office at time of visit.  I discussed the limitations, risks, security and privacy concerns of performing an evaluation and management service by telephone and the availability of in person appointments. I also discussed with the patient that there may be a patient responsible charge related to this service. The patient expressed understanding and agreed to proceed.   History and Present Illness:   Chief Complaint: Leg Pain   HPI Patient calls in stating that left leg started feeling "tired" and then started aching and hurting. Rates pain 10/10. She says the entire leg is sore from knee down. The back of leg is sore from knee up. She denies any calf pain with foot flexion. No swelling in calf area. She gets numbness in leg when sitting and pain increases with standing. She says her foot is fairly numb.her foot is very cold but not pale or purple. Good cap refill.    Review of Systems  Constitutional: Negative for diaphoresis and weight loss.  Eyes: Negative for blurred vision, double vision and pain.  Respiratory: Negative for shortness of breath.   Cardiovascular: Negative for chest pain, palpitations, orthopnea and leg swelling.    Gastrointestinal: Negative for abdominal pain.  Skin: Negative for rash.  Neurological: Negative for dizziness, sensory change, loss of consciousness, weakness and headaches.  Endo/Heme/Allergies: Negative for polydipsia. Does not bruise/bleed easily.  Psychiatric/Behavioral: Negative for memory loss. The patient does not have insomnia.   All other systems reviewed and are negative.    Observations/Objective: Alert and oriented- answers all questions appropriately No distress  Pain down back of upper leg Numbness of foot Good cap refill- according to patient  Assessment and Plan: Karla Maxwell in today with chief complaint of Leg Pain   1. Sciatica of left side Rest Heating pad - predniSONE (STERAPRED UNI-PAK 21 TAB) 10 MG (21) TBPK tablet; As directed x 6 days  Dispense: 21 tablet; Refill: 0    Follow Up Instructions: Call me Monday and let me know how it is.    I discussed the assessment and treatment plan with the patient. The patient was provided an opportunity to ask questions and all were answered. The patient agreed with the plan and demonstrated an understanding of the instructions.   The patient was advised to call back or seek an in-person evaluation if the symptoms worsen or if the condition fails to improve as anticipated.  The above assessment and management plan was discussed with the patient. The patient verbalized understanding of and has agreed to the management plan. Patient is aware to call the clinic if symptoms persist or worsen. Patient is aware when to return to the clinic for a follow-up visit. Patient educated on when it is appropriate to go to the emergency department.   Time call ended:  9:58  I provided 13 minutes of non-face-to-face time during this encounter.    Mary-Margaret Hassell Done, FNP

## 2019-06-06 ENCOUNTER — Telehealth: Payer: Self-pay | Admitting: Nurse Practitioner

## 2019-06-06 ENCOUNTER — Other Ambulatory Visit: Payer: Self-pay | Admitting: Nurse Practitioner

## 2019-06-06 DIAGNOSIS — M5432 Sciatica, left side: Secondary | ICD-10-CM

## 2019-06-06 NOTE — Progress Notes (Unsigned)
[facility] orth

## 2019-06-06 NOTE — Telephone Encounter (Signed)
Not sure they hav eortho in [place] that will address this issue because I think it is conming form back but will try

## 2019-06-06 NOTE — Telephone Encounter (Signed)
Was prescribed prednisone for sciatic. Foot still feels like needles or poking her in her foot cause toe pain. Patient is still taking the prednisone. Her last one will be tomorrow. Please advise on if she can do anything else. Patient uses [facility] in [place].

## 2019-06-06 NOTE — Telephone Encounter (Signed)
Patient aware and verbalized understanding. She would like to go to ortho in [place] not [facility].

## 2019-06-06 NOTE — Telephone Encounter (Signed)
Will need to do referral to [facility]- is that ok with her

## 2019-06-07 ENCOUNTER — Telehealth: Payer: Self-pay | Admitting: Nurse Practitioner

## 2019-06-09 NOTE — Telephone Encounter (Signed)
Please make sure patient is aware of appointment

## 2019-06-09 NOTE — Telephone Encounter (Signed)
LM for Patient

## 2019-06-13 ENCOUNTER — Ambulatory Visit: Payer: Medicare Other

## 2019-06-13 ENCOUNTER — Encounter: Payer: Self-pay | Admitting: Orthopaedic Surgery

## 2019-06-13 ENCOUNTER — Ambulatory Visit: Payer: Medicare Other | Admitting: Orthopaedic Surgery

## 2019-06-13 ENCOUNTER — Other Ambulatory Visit: Payer: Self-pay

## 2019-06-13 VITALS — BP 199/80 | HR 104 | Ht 61.0 in | Wt 165.0 lb

## 2019-06-13 DIAGNOSIS — G8929 Other chronic pain: Secondary | ICD-10-CM | POA: Diagnosis not present

## 2019-06-13 DIAGNOSIS — M5442 Lumbago with sciatica, left side: Secondary | ICD-10-CM

## 2019-06-13 MED ORDER — NAPROXEN 500 MG PO TABS: 500.0000 mg | ORAL_TABLET | Freq: Two times a day (BID) | ORAL | 5 refills | Status: DC
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
## 2019-06-13 NOTE — Patient Instructions (Signed)
Steps to Quit Smoking Smoking tobacco is the leading cause of preventable death. It can affect almost every organ in the body. Smoking puts you and people around you at risk for many serious, long-lasting (chronic) diseases. Quitting smoking can be hard, but it is one of the best things that you can do for your health. It is never too late to quit. How do I get ready to quit? When you decide to quit smoking, make a plan to help you succeed. Before you quit:  Pick a date to quit. Set a date within the next 2 weeks to give you time to prepare.  Write down the reasons why you are quitting. Keep this list in places where you will see it often.  Tell your family, friends, and co-workers that you are quitting. Their support is important.  Talk with your doctor about the choices that may help you quit.  Find out if your health insurance will pay for these treatments.  Know the people, places, things, and activities that make you want to smoke (triggers). Avoid them. What first steps can I take to quit smoking?  Throw away all cigarettes at home, at work, and in your car.  Throw away the things that you use when you smoke, such as ashtrays and lighters.  Clean your car. Make sure to empty the ashtray.  Clean your home, including curtains and carpets. What can I do to help me quit smoking? Talk with your doctor about taking medicines and seeing a counselor at the same time. You are more likely to succeed when you do both.  If you are pregnant or breastfeeding, talk with your doctor about counseling or other ways to quit smoking. Do not take medicine to help you quit smoking unless your doctor tells you to do so. To quit smoking: Quit right away  Quit smoking totally, instead of slowly cutting back on how much you smoke over a period of time.  Go to counseling. You are more likely to quit if you go to counseling sessions regularly. Take medicine You may take medicines to help you quit. Some  medicines need a prescription, and some you can buy over-the-counter. Some medicines may contain a drug called nicotine to replace the nicotine in cigarettes. Medicines may:  Help you to stop having the desire to smoke (cravings).  Help to stop the problems that come when you stop smoking (withdrawal symptoms). Your doctor may ask you to use:  Nicotine patches, gum, or lozenges.  Nicotine inhalers or sprays.  Non-nicotine medicine that is taken by mouth. Find resources Find resources and other ways to help you quit smoking and remain smoke-free after you quit. These resources are most helpful when you use them often. They include:  Online chats with a counselor.  Phone quitlines.  Printed self-help materials.  Support groups or group counseling.  Text messaging programs.  Mobile phone apps. Use apps on your mobile phone or tablet that can help you stick to your quit plan. There are many free apps for mobile phones and tablets as well as websites. Examples include Quit Guide from the CDC and smokefree.gov  What things can I do to make it easier to quit?   Talk to your family and friends. Ask them to support and encourage you.  Call a phone quitline (1-800-QUIT-NOW), reach out to support groups, or work with a counselor.  Ask people who smoke to not smoke around you.  Avoid places that make you want to smoke,   such as: ? Bars. ? Parties. ? Smoke-break areas at work.  Spend time with people who do not smoke.  Lower the stress in your life. Stress can make you want to smoke. Try these things to help your stress: ? Getting regular exercise. ? Doing deep-breathing exercises. ? Doing yoga. ? Meditating. ? Doing a body scan. To do this, close your eyes, focus on one area of your body at a time from head to toe. Notice which parts of your body are tense. Try to relax the muscles in those areas. How will I feel when I quit smoking? Day 1 to 3 weeks Within the first 24 hours,  you may start to have some problems that come from quitting tobacco. These problems are very bad 2-3 days after you quit, but they do not often last for more than 2-3 weeks. You may get these symptoms:  Mood swings.  Feeling restless, nervous, angry, or annoyed.  Trouble concentrating.  Dizziness.  Strong desire for high-sugar foods and nicotine.  Weight gain.  Trouble pooping (constipation).  Feeling like you may vomit (nausea).  Coughing or a sore throat.  Changes in how the medicines that you take for other issues work in your body.  Depression.  Trouble sleeping (insomnia). Week 3 and afterward After the first 2-3 weeks of quitting, you may start to notice more positive results, such as:  Better sense of smell and taste.  Less coughing and sore throat.  Slower heart rate.  Lower blood pressure.  Clearer skin.  Better breathing.  Fewer sick days. Quitting smoking can be hard. Do not give up if you fail the first time. Some people need to try a few times before they succeed. Do your best to stick to your quit plan, and talk with your doctor if you have any questions or concerns. Summary  Smoking tobacco is the leading cause of preventable death. Quitting smoking can be hard, but it is one of the best things that you can do for your health.  When you decide to quit smoking, make a plan to help you succeed.  Quit smoking right away, not slowly over a period of time.  When you start quitting, seek help from your doctor, family, or friends. This information is not intended to replace advice given to you by your health care provider. Make sure you discuss any questions you have with your health care provider. Document Revised: 01/13/2019 Document Reviewed: 07/09/2018 Elsevier Patient Education  2020 [employer] Inc.  

## 2019-06-28 ENCOUNTER — Ambulatory Visit (HOSPITAL_COMMUNITY)
Admission: RE | Admit: 2019-06-28 | Discharge: 2019-06-28 | Disposition: A | Discharge status: Home / Self Care | Payer: Medicare Other | Source: Ambulatory Visit | Attending: Orthopaedic Surgery | Admitting: Orthopaedic Surgery

## 2019-06-28 ENCOUNTER — Other Ambulatory Visit: Payer: Self-pay

## 2019-06-28 DIAGNOSIS — G8929 Other chronic pain: Secondary | ICD-10-CM | POA: Insufficient documentation

## 2019-06-28 DIAGNOSIS — M5442 Lumbago with sciatica, left side: Secondary | ICD-10-CM | POA: Insufficient documentation

## 2019-06-28 DIAGNOSIS — M48061 Spinal stenosis, lumbar region without neurogenic claudication: Secondary | ICD-10-CM | POA: Diagnosis not present

## 2019-06-29 ENCOUNTER — Ambulatory Visit: Payer: Medicare Other | Admitting: Orthopaedic Surgery

## 2019-06-29 ENCOUNTER — Encounter: Payer: Self-pay | Admitting: Orthopaedic Surgery

## 2019-06-29 VITALS — BP 160/71 | HR 76 | Temp 97.2°F | Ht 61.0 in | Wt 168.8 lb

## 2019-06-29 DIAGNOSIS — G8929 Other chronic pain: Secondary | ICD-10-CM | POA: Diagnosis not present

## 2019-06-29 DIAGNOSIS — M5442 Lumbago with sciatica, left side: Secondary | ICD-10-CM
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
## 2019-06-29 NOTE — Addendum Note (Signed)
Addended by: Derek Mound A on: 06/29/2019 05:01 PM   Modules accepted: Orders

## 2019-07-11 ENCOUNTER — Ambulatory Visit: Payer: Medicare Other | Admitting: *Deleted

## 2019-07-11 NOTE — Chronic Care Management (AMB) (Signed)
  Chronic Care Management   Outreach Note  07/11/2019 Name: Karla Maxwell MRN: [id number] DOB: 1950/08/23  Referred by: Chevis Pretty, FNP Reason for referral : Chronic Care Management (RN follow up)   An unsuccessful telephone follow-up was attempted today. The patient was referred to the case management team for assistance with care management and care coordination.   Follow Up Plan: The care management team will reach out to the patient again over the next 45 days.   [name], BSN, RN-BC Embedded Chronic Care Manager [facility] Direct Dial: [phone]

## 2019-07-19 ENCOUNTER — Encounter: Payer: Self-pay | Admitting: Physical Medicine and Rehabilitation

## 2019-07-19 ENCOUNTER — Other Ambulatory Visit: Payer: Self-pay

## 2019-07-19 ENCOUNTER — Ambulatory Visit (INDEPENDENT_AMBULATORY_CARE_PROVIDER_SITE_OTHER): Payer: Medicare Other | Admitting: Physical Medicine and Rehabilitation

## 2019-07-19 ENCOUNTER — Ambulatory Visit: Payer: Self-pay

## 2019-07-19 VITALS — BP 130/59 | HR 73

## 2019-07-19 DIAGNOSIS — M5116 Intervertebral disc disorders with radiculopathy, lumbar region: Secondary | ICD-10-CM

## 2019-07-19 DIAGNOSIS — M5416 Radiculopathy, lumbar region: Secondary | ICD-10-CM | POA: Diagnosis not present

## 2019-07-19 DIAGNOSIS — M48062 Spinal stenosis, lumbar region with neurogenic claudication: Secondary | ICD-10-CM

## 2019-07-19 MED ORDER — METHYLPREDNISOLONE ACETATE 80 MG/ML IJ SUSP
40.0000 mg | Freq: Once | INTRAMUSCULAR | Status: AC
  Administered 2019-07-19: 40 mg

## 2019-07-19 NOTE — Progress Notes (Signed)
Pt states pain in the lower back that radiates into the left leg all the way down with numbness in the left foot. Pt states pain started years ago and has gotten worse in January 2021. Sitting and not being active makes pain worse. Naproxen helps with pain.   .Numeric Pain Rating Scale and Functional Assessment Average Pain 5   In the last MONTH (on 0-10 scale) has pain interfered with the following?  1. General activity like being  able to carry out your everyday physical activities such as walking, climbing stairs, carrying groceries, or moving a chair?  Rating(7)   +Driver, -BT, -Dye Allergies.

## 2019-08-02 ENCOUNTER — Other Ambulatory Visit: Payer: Self-pay

## 2019-08-02 DIAGNOSIS — J189 Pneumonia, unspecified organism: Secondary | ICD-10-CM

## 2019-08-02 NOTE — Progress Notes (Signed)
Spoke with patient and advised that she needed to come in for follow up xray for recent pneumonia. Patient verbalized understanding. Future order placed and patient will drop by. Confirmed that she had no covid symptoms.

## 2019-08-03 ENCOUNTER — Other Ambulatory Visit: Payer: Self-pay

## 2019-08-03 ENCOUNTER — Other Ambulatory Visit: Payer: Medicare Other

## 2019-08-03 ENCOUNTER — Ambulatory Visit (INDEPENDENT_AMBULATORY_CARE_PROVIDER_SITE_OTHER): Payer: Medicare Other

## 2019-08-03 DIAGNOSIS — J189 Pneumonia, unspecified organism: Secondary | ICD-10-CM

## 2019-08-03 DIAGNOSIS — I700 Atherosclerosis of aorta: Secondary | ICD-10-CM | POA: Diagnosis not present

## 2019-08-08 NOTE — Procedures (Signed)
Lumbar Epidural Steroid Injection - Interlaminar Approach with Fluoroscopic Guidance  Patient: Karla Maxwell      Date of Birth: 10-19-1950 MRN: [id number] PCP: Chevis Pretty, FNP      Visit Date: 07/19/2019   Universal Protocol:     Consent Given By: the patient  Position: PRONE  Additional Comments: Vital signs were monitored before and after the procedure. Patient was prepped and draped in the usual sterile fashion. The correct patient, procedure, and site was verified.   Injection Procedure Details:  Procedure Site One Meds Administered:  Meds ordered this encounter  Medications  . methylPREDNISolone acetate (DEPO-MEDROL) injection 40 mg     Laterality: Left  Location/Site:  L5-S1  Needle size: 20 G  Needle type: Tuohy  Needle Placement: Paramedian epidural  Findings:   -Comments: Excellent flow of contrast into the epidural space.  Procedure Details: Using a paramedian approach from the side mentioned above, the region overlying the inferior lamina was localized under fluoroscopic visualization and the soft tissues overlying this structure were infiltrated with 4 ml. of 1% Lidocaine without Epinephrine. The Tuohy needle was inserted into the epidural space using a paramedian approach.   The epidural space was localized using loss of resistance along with lateral and bi-planar fluoroscopic views.  After negative aspirate for air, blood, and CSF, a 2 ml. volume of Isovue-250 was injected into the epidural space and the flow of contrast was observed. Radiographs were obtained for documentation purposes.    The injectate was administered into the level noted above.   Additional Comments:  The patient tolerated the procedure well Dressing: 2 x 2 sterile gauze and Band-Aid    Post-procedure details: Patient was observed during the procedure. Post-procedure instructions were reviewed.  Patient left the clinic in stable condition.

## 2019-08-08 NOTE — Progress Notes (Signed)
Karla Maxwell - 69 y.o. female MRN [id number]  Date of birth: [birth year]  Office Visit Note: Visit Date: 07/19/2019 PCP: Chevis Pretty, FNP Referred by: Hassell Done, Mary-Margaret, *  Subjective: Chief Complaint  Patient presents with  . Lower Back - Pain  . Left Leg - Pain  . Left Foot - Numbness   HPI:  Karla Maxwell is a 69 y.o. female who comes in today For lumbar epidural injection for chronic worsening severe year left radicular leg pain with MRI evidence of severe multifactorial stenosis at L4-5.  She is having left radicular pain with numbness and tingling.  She has had no focal weakness no prior lumbar surgery.  We discussed her MRI and showed her pictures with spine model.  We will complete interlaminar injection at L5-S1 below the stenosis.  Depending on relief would look at transforaminal approach at the level of stenosis.  She may unfortunately need to be seen by one of the spine surgeons for evaluation for potential decompression.  ROS Otherwise per HPI.  Assessment & Plan: Visit Diagnoses:  1. Lumbar radiculopathy   2. Spinal stenosis of lumbar region with neurogenic claudication   3. Radiculopathy due to lumbar intervertebral disc disorder     Plan: No additional findings.   Meds & Orders:  Meds ordered this encounter  Medications  . methylPREDNISolone acetate (DEPO-MEDROL) injection 40 mg    Orders Placed This Encounter  Procedures  . XR C-ARM NO REPORT  . Ambulatory referral to Orthopedic Surgery  . Epidural Steroid injection    Follow-up: No follow-ups on file.   Procedures: No procedures performed  Lumbar Epidural Steroid Injection - Interlaminar Approach with Fluoroscopic Guidance  Patient: Karla Maxwell      Date of Birth: 03-Jan-1951 MRN: [id number] PCP: Chevis Pretty, FNP      Visit Date: 07/19/2019   Universal Protocol:     Consent Given By: the patient  Position: PRONE  Additional Comments: Vital signs were monitored  before and after the procedure. Patient was prepped and draped in the usual sterile fashion. The correct patient, procedure, and site was verified.   Injection Procedure Details:  Procedure Site One Meds Administered:  Meds ordered this encounter  Medications  . methylPREDNISolone acetate (DEPO-MEDROL) injection 40 mg     Laterality: Left  Location/Site:  L5-S1  Needle size: 20 G  Needle type: Tuohy  Needle Placement: Paramedian epidural  Findings:   -Comments: Excellent flow of contrast into the epidural space.  Procedure Details: Using a paramedian approach from the side mentioned above, the region overlying the inferior lamina was localized under fluoroscopic visualization and the soft tissues overlying this structure were infiltrated with 4 ml. of 1% Lidocaine without Epinephrine. The Tuohy needle was inserted into the epidural space using a paramedian approach.   The epidural space was localized using loss of resistance along with lateral and bi-planar fluoroscopic views.  After negative aspirate for air, blood, and CSF, a 2 ml. volume of Isovue-250 was injected into the epidural space and the flow of contrast was observed. Radiographs were obtained for documentation purposes.    The injectate was administered into the level noted above.   Additional Comments:  The patient tolerated the procedure well Dressing: 2 x 2 sterile gauze and Band-Aid    Post-procedure details: Patient was observed during the procedure. Post-procedure instructions were reviewed.  Patient left the clinic in stable condition.    Clinical History: MRI LUMBAR SPINE WITHOUT CONTRAST  TECHNIQUE: Multiplanar, multisequence  MR imaging of the lumbar spine was performed. No intravenous contrast was administered.  COMPARISON:  Lumbar spine radiographs 06/13/2019  FINDINGS: Segmentation: 5 non rib-bearing lumbar type vertebral bodies are present. The lowest fully formed vertebral body is  L5.  Alignment: Grade 1 retrolisthesis is again seen at L2-3 and L3-4. Lumbar lordosis is preserved. Levoconvex curvature is centered at L2-3. Rightward curvature is present at L4-5.  Vertebrae: A remote superior endplate fracture at L2 is worse on the right. Fatty endplate marrow changes are present on the right at L1-2. Mild endplate marrow changes are present on the left at L4-5.  Conus medullaris and cauda equina: Conus extends to the L1 level. Conus and cauda equina appear normal.  Paraspinal and other soft tissues: Limited imaging the abdomen is unremarkable. There is no significant adenopathy. No solid organ lesions are present.  Disc levels:  T12-L1: Negative.  L1-2: A broad-based disc protrusion is present. Far right annular tear is noted. Mild facet hypertrophy is noted bilaterally. Central canal is patent. Moderate to severe right and moderate left foraminal stenosis is present.  L2-3: A broad-based disc protrusion is present. Moderate facet hypertrophy is noted bilaterally. Moderate right and mild left subarticular stenosis is present. Subarticular narrowing is worse on the right.  L3-4: A broad-based disc protrusion is present. Advanced facet hypertrophy and ligamentum flavum thickening is noted. Moderate subarticular and foraminal stenosis is present bilaterally, left greater than right.  L4-5: A broad-based disc protrusion is present. Moderate facet hypertrophy and ligamentum flavum thickening is noted. Severe central canal stenosis is present. Severe left and moderate right foraminal narrowing is present.  L5-S1: Mild disc bulging is present. Moderate facet spurring is worse left than right. The central canal is patent. Foramina are patent bilaterally.  IMPRESSION: 1. Multilevel spondylosis of the lumbar spine as described. 2. Moderate to severe right and moderate left foraminal stenosis at L1-2. 3. Moderate right and mild left subarticular  stenosis at L2-3 likely impacting the L3 nerve roots. 4. Moderate subarticular and foraminal narrowing bilaterally at L3-4 is worse on the left. 5. Severe central canal stenosis at L4-5 with severe left and moderate right foraminal narrowing. 6. Moderate facet spurring at L5-S1 without significant stenosis.   Electronically Signed   By: San Morelle M.D.   On: 06/28/2019 20:28     Objective:  VS:  HT:    WT:   BMI:     BP:(!) 130/59  HR:73bpm  TEMP: ( )  RESP:  Physical Exam  Ortho Exam Imaging: No results found.

## 2019-08-10 ENCOUNTER — Ambulatory Visit: Payer: Medicare Other | Admitting: Orthopaedic Surgery

## 2019-08-11 ENCOUNTER — Ambulatory Visit: Payer: Medicare Other | Admitting: *Deleted

## 2019-08-11 NOTE — Chronic Care Management (AMB) (Signed)
  Chronic Care Management   Outreach Note  08/11/2019 Name: Karla Maxwell MRN: [id number] DOB: [birth year]  Referred by: Chevis Pretty, FNP Reason for referral : Chronic Care Management (RN follow up)   An unsuccessful telephone follow-up was attempted today. The patient was referred to the case management team for assistance with care management and care coordination.   Follow Up Plan: The care management team will reach out to the patient again over the next 30 days.   [name], BSN, RN-BC Embedded Chronic Care Manager [facility] [place] [facility] Direct Dial: [phone]

## 2019-08-16 ENCOUNTER — Ambulatory Visit: Payer: Medicare Other | Admitting: Specialist

## 2019-08-16 ENCOUNTER — Encounter: Payer: Self-pay | Admitting: Specialist

## 2019-08-16 ENCOUNTER — Other Ambulatory Visit: Payer: Self-pay

## 2019-08-16 VITALS — BP 127/67 | HR 70 | Ht 61.5 in | Wt 169.0 lb

## 2019-08-16 DIAGNOSIS — M4156 Other secondary scoliosis, lumbar region: Secondary | ICD-10-CM | POA: Diagnosis not present

## 2019-08-16 DIAGNOSIS — Z8781 Personal history of (healed) traumatic fracture: Secondary | ICD-10-CM | POA: Diagnosis not present

## 2019-08-16 DIAGNOSIS — M5416 Radiculopathy, lumbar region: Secondary | ICD-10-CM

## 2019-08-16 DIAGNOSIS — M5136 Other intervertebral disc degeneration, lumbar region: Secondary | ICD-10-CM | POA: Diagnosis not present

## 2019-08-16 DIAGNOSIS — M48062 Spinal stenosis, lumbar region with neurogenic claudication: Secondary | ICD-10-CM | POA: Diagnosis not present

## 2019-08-16 MED ORDER — GABAPENTIN 100 MG PO CAPS: 100.0000 mg | ORAL_CAPSULE | Freq: Every day | ORAL | 2 refills | Status: DC
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
## 2019-08-16 NOTE — Patient Instructions (Addendum)
Avoid bending, stooping and avoid lifting weights greater than 10 lbs. Avoid prolong standing and walking. Avoid frequent bending and stooping  No lifting greater than 10 lbs. May use ice or moist heat for pain. Weight loss is of benefit. Handicap license is approved. Avoid bending, stooping and avoid lifting weights greater than 10 lbs. Avoid prolong standing and walking.  Surgery scheduling secretary Kandice Hams, will call you in the next week to schedule for surgery.  Surgery recommended is a four level lumbar laminectomy surgery L2-3, L3-4,L4-5 and L5-S1  this would be done with partial hemilaminectomies left L2-3, L3-4 and L5-S1 and central laminectomy L4-5 for spinal stenosis with the use of OR microscope.Take hydrocodone for for pain. Risk of surgery includes risk of infection 1 in 200 patients, bleeding 1/2% chance you would need a transfusion.   Risk to the nerves is one in 10,000. Will start gabapentin 100 mg low dose at night and can increase to 2 tab at night if needed to decrease night pain. Call if you wish for [place] to schedule surgery or further epidural steroids.  Expect improved walking and standing tolerance. Expect relief of leg pain but numbness may persist depending on the length and degree of pressure that has been present.

## 2019-08-16 NOTE — Addendum Note (Signed)
Addended by: Basil Dess on: 08/16/2019 12:42 PM   Modules accepted: Orders

## 2019-08-18 ENCOUNTER — Telehealth: Payer: Self-pay | Admitting: Nurse Practitioner

## 2019-08-18 NOTE — Telephone Encounter (Signed)
Patient states that she has been seeing a specialist and he has started her on Gabapentin. She states that after next month she wont be seeing him any more and wants to know if [facility] will be able to continue filling her gabapentin. I advised patient that [facility]MM could continue that med and fill. Patient verbalized understanding.

## 2019-09-01 ENCOUNTER — Ambulatory Visit: Payer: Medicare Other | Admitting: *Deleted

## 2019-09-05 NOTE — Chronic Care Management (AMB) (Signed)
  Chronic Care Management   Outreach Note  09/05/2019 Name: Karla Maxwell MRN: [id number] DOB: [birth year]  Referred by: Chevis Pretty, FNP Reason for referral : Chronic Care Management (Follow up)   An unsuccessful telephone follow-up was attempted today. The patient was referred to the case management team for assistance with care management and care coordination.   Follow Up Plan: The care management team will reach out to the patient again over the next 30 days.   [name], BSN, RN-BC Embedded Chronic Care Manager [facility] [place] Management Direct Dial: [phone]

## 2019-09-07 ENCOUNTER — Ambulatory Visit (INDEPENDENT_AMBULATORY_CARE_PROVIDER_SITE_OTHER): Payer: Medicare Other | Admitting: *Deleted

## 2019-09-07 DIAGNOSIS — Z0000 Encounter for general adult medical examination without abnormal findings: Secondary | ICD-10-CM
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
## 2019-09-14 ENCOUNTER — Ambulatory Visit: Payer: Medicare Other | Admitting: Specialist

## 2019-09-21 ENCOUNTER — Telehealth: Payer: Self-pay | Admitting: *Deleted

## 2019-09-21 ENCOUNTER — Ambulatory Visit (INDEPENDENT_AMBULATORY_CARE_PROVIDER_SITE_OTHER): Payer: Medicare Other | Admitting: *Deleted

## 2019-09-21 DIAGNOSIS — J449 Chronic obstructive pulmonary disease, unspecified: Secondary | ICD-10-CM

## 2019-09-21 DIAGNOSIS — M5432 Sciatica, left side: Secondary | ICD-10-CM

## 2019-09-21 MED ORDER — GABAPENTIN 100 MG PO CAPS: 100.0000 mg | ORAL_CAPSULE | Freq: Every day | ORAL | 2 refills | Status: DC

## 2019-09-21 NOTE — Telephone Encounter (Signed)
09/21/2019  Ms Karla Maxwell is unhappy with Dr Otho Ket recommendation for surgery on her lumbar spine. She does not plan to follow-up with him and is asking if [name] can refill her gabapentin to [facility]. She last had it filled on 09/12/19. She's taking 100mg  qhs now but feels that a stronger dose may help more.   I will forward to PCP for review and recommendation.   [name], BSN, RN-BC Embedded Chronic Care Manager [facility] Direct Dial: [phone]

## 2019-09-21 NOTE — Patient Instructions (Signed)
Visit Information  Goals Addressed            This Visit's Progress     Patient Stated   . "I want to better manage my back pain" (pt-stated)       CARE PLAN ENTRY (see longitudinal plan of care for additional care plan information)  Current Barriers:  . Care Coordination needs related to lower back pain with radicular left leg and foot pain in a patient with lumbar spinal stenosis, HTN, and COPD (disease states)  Nurse Case Manager Clinical Goal(s):  Marland Kitchen Over the next 30 days, patient will work with PCP regarding management of her chronic back pain . Over the next 60 days, patient will talk with RN Care Manager regarding chronic back pain  Interventions:  . Inter-disciplinary care team collaboration (see longitudinal plan of care) . Talked with patient by telephone . Chart reviewed including recent office notes and imaging reports . Discussed visit with Dr Ernestina Patches and Dr Louanne Skye o Patient is not interested in surgery that Dr Louanne Skye recommended. She is concerned about long-term effects . Reviewed and discussed medications: Neurontin 100mg  qhs o Patient requesting that PCP take over refilling this medication . Message sent to Chevis Pretty, FNP regarding patient's request that she take over refilling neurontin because she does not plan to follow-up with Dr Louanne Skye . Provided patient with RN Care Manager contact information and encouraged to reach out as needed  Patient Self Care Activities:  . Performs ADL's independently . Performs IADL's independently  Initial goal documentation     . "I want to keep my COPD under control" (pt-stated)       Current Barriers:  . Chronic Disease Management support and education needs related to COPD  Nurse Case Manager Clinical Goal(s):  Marland Kitchen Over the next 60 days, patient will continue take demonstrate good healthcare management by taking medications as prescribed  Interventions:  . Evaluation of current treatment plan related to COPD and  patient's adherence to plan as established by provider. . Reviewed medications with patient and discussed Symbicort and Singulair . Discussed plans with patient for ongoing care management follow up and provided patient with direct contact information for care management team  Patient Self Care Activities:  . Performs IADL's independently . Calls provider office for new concerns or questions  Please see past updates related to this goal by clicking on the "Past Updates" button in the selected goal         The patient verbalized understanding of instructions provided today and declined a print copy of patient instruction materials.   Follow-up Plan The care management team will reach out to the patient again over the next 60 days.   [name], BSN, RN-BC Embedded Chronic Care Manager [facility] [place] [facility] Direct Dial: [phone]

## 2019-09-21 NOTE — Telephone Encounter (Signed)
neurontin refilled

## 2019-09-21 NOTE — Chronic Care Management (AMB) (Signed)
Chronic Care Management   Follow Up Note   09/21/2019 Name: Karla Maxwell MRN: [id number] DOB: 24-Apr-1951  Referred by: Chevis Pretty, FNP Reason for referral : Chronic Care Management (RN follow up)   Karla Maxwell is a 69 y.o. year old female who is a primary care patient of Chevis Pretty, [place]. The CCM team was consulted for assistance with chronic disease management and care coordination needs.    Review of patient status, including review of consultants reports, relevant laboratory and other test results, and collaboration with appropriate care team members and the patient's provider was performed as part of comprehensive patient evaluation and provision of chronic care management services.    I talked with Ms Viger today regarding management of her chronic medical conditions.   SDOH (Social Determinants of Health) assessments performed: No See Care Plan activities for detailed interventions related to [facility])     Outpatient Encounter Medications as of 09/21/2019  Medication Sig  . amLODipine (NORVASC) 10 MG tablet Take 1 tablet (10 mg total) by mouth daily.  Marland Kitchen aspirin EC 81 MG tablet Take 162 mg by mouth daily.  Marland Kitchen atorvastatin (LIPITOR) 80 MG tablet Take 1 tablet (80 mg total) by mouth daily.  . blood glucose meter kit and supplies KIT Dispense based on patient and insurance preference. Use up to four times daily as directed. (FOR ICD-9 250.00, 250.01).  . budesonide-formoterol (SYMBICORT) 160-4.5 MCG/ACT inhaler INHALE 2 PUFFS BY MOUTH INTO  THE LUNGS TWICE A DAY AS INSTRUCTED  . fenofibrate micronized (LOFIBRA) 134 MG capsule TAKE ONE CAPSULE BY MOUTH ONE TIME DAILY BEFORE BREAKFAST  . gabapentin (NEURONTIN) 100 MG capsule Take 1 capsule (100 mg total) by mouth at bedtime.  Marland Kitchen levothyroxine (SYNTHROID) 75 MCG tablet TAKE 1 TABLET (75 MCG TOTAL)   BY MOUTH DAILY.  Marland Kitchen loratadine (CLARITIN) 10 MG tablet Take 1 tablet (10 mg total) by mouth daily.  . metFORMIN  (GLUCOPHAGE) 500 MG tablet TAKE 1 2 (ONE HALF) TABLET BY MOUTH TWICE DAILY WITH MEALS  . montelukast (SINGULAIR) 10 MG tablet Take 1 tablet (10 mg total) by mouth daily with breakfast.  . omeprazole (PRILOSEC) 40 MG capsule Take 1 capsule (40 mg total) by mouth daily.   No facility-administered encounter medications on file as of 09/21/2019.     RN Care Plan   . "I want to better manage my back pain" (pt-stated)       CARE PLAN ENTRY (see longitudinal plan of care for additional care plan information)  Current Barriers:  . Care Coordination needs related to lower back pain with radicular left leg and foot pain in a patient with lumbar spinal stenosis, HTN, and COPD (disease states)  Nurse Case Manager Clinical Goal(s):  Marland Kitchen Over the next 30 days, patient will work with PCP regarding management of her chronic back pain . Over the next 60 days, patient will talk with RN Care Manager regarding chronic back pain  Interventions:  . Inter-disciplinary care team collaboration (see longitudinal plan of care) . Talked with patient by telephone . Chart reviewed including recent office notes and imaging reports . Discussed visit with Dr Ernestina Patches and Dr Louanne Skye o Patient is not interested in surgery that Dr Louanne Skye recommended. She is concerned about long-term effects . Reviewed and discussed medications: Neurontin '100mg'$  qhs o Patient requesting that PCP take over refilling this medication . Message sent to Chevis Pretty, FNP regarding patient's request that she take over refilling neurontin because she does not plan  to follow-up with Dr Louanne Skye . Provided patient with RN Care Manager contact information and encouraged to reach out as needed  Patient Self Care Activities:  . Performs ADL's independently . Performs IADL's independently  Initial goal documentation     . "I want to keep my COPD under control" (pt-stated)       Current Barriers:  . Chronic Disease Management support and education  needs related to COPD  Nurse Case Manager Clinical Goal(s):  Marland Kitchen Over the next 60 days, patient will continue take demonstrate good healthcare management by taking medications as prescribed  Interventions:  . Evaluation of current treatment plan related to COPD and patient's adherence to plan as established by provider. . Reviewed medications with patient and discussed Symbicort and Singulair . Discussed plans with patient for ongoing care management follow up and provided patient with direct contact information for care management team  Patient Self Care Activities:  . Performs IADL's independently . Calls provider office for new concerns or questions  Please see past updates related to this goal by clicking on the "Past Updates" button in the selected goal          Plan:   The care management team will reach out to the patient again over the next 60 days.   [name], BSN, RN-BC Embedded Chronic Care Manager [facility] [place] [facility] Direct Dial: [phone]

## 2019-11-14 ENCOUNTER — Other Ambulatory Visit: Payer: Self-pay

## 2019-11-14 ENCOUNTER — Ambulatory Visit (INDEPENDENT_AMBULATORY_CARE_PROVIDER_SITE_OTHER): Payer: Medicare Other | Admitting: Nurse Practitioner

## 2019-11-14 ENCOUNTER — Encounter: Payer: Self-pay | Admitting: Nurse Practitioner

## 2019-11-14 VITALS — BP 129/52 | HR 69 | Temp 97.9°F | Resp 20 | Ht 61.0 in | Wt 159.0 lb

## 2019-11-14 DIAGNOSIS — M8580 Other specified disorders of bone density and structure, unspecified site: Secondary | ICD-10-CM

## 2019-11-14 DIAGNOSIS — J449 Chronic obstructive pulmonary disease, unspecified: Secondary | ICD-10-CM

## 2019-11-14 DIAGNOSIS — E119 Type 2 diabetes mellitus without complications: Secondary | ICD-10-CM

## 2019-11-14 DIAGNOSIS — I10 Essential (primary) hypertension: Secondary | ICD-10-CM

## 2019-11-14 DIAGNOSIS — K219 Gastro-esophageal reflux disease without esophagitis: Secondary | ICD-10-CM | POA: Diagnosis not present

## 2019-11-14 DIAGNOSIS — E782 Mixed hyperlipidemia: Secondary | ICD-10-CM | POA: Diagnosis not present

## 2019-11-14 DIAGNOSIS — J4489 Other specified chronic obstructive pulmonary disease: Secondary | ICD-10-CM

## 2019-11-14 DIAGNOSIS — Z720 Tobacco use: Secondary | ICD-10-CM

## 2019-11-14 DIAGNOSIS — F330 Major depressive disorder, recurrent, mild: Secondary | ICD-10-CM

## 2019-11-14 DIAGNOSIS — E039 Hypothyroidism, unspecified: Secondary | ICD-10-CM | POA: Diagnosis not present

## 2019-11-14 DIAGNOSIS — Z6834 Body mass index (BMI) 34.0-34.9, adult: Secondary | ICD-10-CM

## 2019-11-14 DIAGNOSIS — G8929 Other chronic pain: Secondary | ICD-10-CM

## 2019-11-14 DIAGNOSIS — M5442 Lumbago with sciatica, left side: Secondary | ICD-10-CM

## 2019-11-14 LAB — BAYER DCA HB A1C WAIVED: HB A1C (BAYER DCA - WAIVED): 5.9 % (ref <7.0)

## 2019-11-14 MED ORDER — OMEPRAZOLE 40 MG PO CPDR: 40.0000 mg | DELAYED_RELEASE_CAPSULE | Freq: Every day | ORAL | 1 refills | Status: DC

## 2019-11-14 MED ORDER — MONTELUKAST SODIUM 10 MG PO TABS: 10.0000 mg | ORAL_TABLET | Freq: Every day | ORAL | 1 refills | Status: DC

## 2019-11-14 MED ORDER — FENOFIBRATE MICRONIZED 134 MG PO CAPS: ORAL_CAPSULE | ORAL | 1 refills | Status: DC

## 2019-11-14 MED ORDER — LEVOTHYROXINE SODIUM 75 MCG PO TABS: ORAL_TABLET | ORAL | 1 refills | Status: DC

## 2019-11-14 MED ORDER — GABAPENTIN 300 MG PO CAPS: 300.0000 mg | ORAL_CAPSULE | Freq: Three times a day (TID) | ORAL | 3 refills | Status: DC

## 2019-11-14 MED ORDER — ATORVASTATIN CALCIUM 80 MG PO TABS: 80.0000 mg | ORAL_TABLET | Freq: Every day | ORAL | 1 refills | Status: DC

## 2019-11-14 MED ORDER — AMLODIPINE BESYLATE 10 MG PO TABS: 10.0000 mg | ORAL_TABLET | Freq: Every day | ORAL | 1 refills | Status: DC

## 2019-11-14 MED ORDER — METFORMIN HCL 500 MG PO TABS: ORAL_TABLET | ORAL | 1 refills | Status: DC

## 2019-11-14 MED ORDER — BUDESONIDE-FORMOTEROL FUMARATE 160-4.5 MCG/ACT IN AERO: INHALATION_SPRAY | RESPIRATORY_TRACT | 1 refills | Status: DC
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
## 2019-11-15 LAB — CBC WITH DIFFERENTIAL/PLATELET
Basophils Absolute: 0.1 10*3/uL (ref 0.0–0.2)
Basos: 1 %
EOS (ABSOLUTE): 0.2 10*3/uL (ref 0.0–0.4)
Eos: 2 %
Hematocrit: 37.7 % (ref 34.0–46.6)
Hemoglobin: 12 g/dL (ref 11.1–15.9)
Immature Grans (Abs): 0 10*3/uL (ref 0.0–0.1)
Immature Granulocytes: 0 %
Lymphocytes Absolute: 3.8 10*3/uL — ABNORMAL HIGH (ref 0.7–3.1)
Lymphs: 33 %
MCH: 26.8 pg (ref 26.6–33.0)
MCHC: 31.8 g/dL (ref 31.5–35.7)
MCV: 84 fL (ref 79–97)
Monocytes Absolute: 0.8 10*3/uL (ref 0.1–0.9)
Monocytes: 7 %
Neutrophils Absolute: 6.5 10*3/uL (ref 1.4–7.0)
Neutrophils: 57 %
Platelets: 493 10*3/uL — ABNORMAL HIGH (ref 150–450)
RBC: 4.48 x10E6/uL (ref 3.77–5.28)
RDW: 14.5 % (ref 11.7–15.4)
WBC: 11.3 10*3/uL — ABNORMAL HIGH (ref 3.4–10.8)

## 2019-11-15 LAB — LIPID PANEL
Chol/HDL Ratio: 3.3 ratio (ref 0.0–4.4)
Cholesterol, Total: 127 mg/dL (ref 100–199)
HDL: 39 mg/dL — ABNORMAL LOW (ref >39)
LDL Chol Calc (NIH): 66 mg/dL (ref 0–99)
Triglycerides: 122 mg/dL (ref 0–149)
VLDL Cholesterol Cal: 22 mg/dL (ref 5–40)

## 2019-11-15 LAB — CMP14+EGFR
ALT: 14 IU/L (ref 0–32)
AST: 15 IU/L (ref 0–40)
Albumin/Globulin Ratio: 1.9 (ref 1.2–2.2)
Albumin: 4.4 g/dL (ref 3.8–4.8)
Alkaline Phosphatase: 56 IU/L (ref 48–121)
BUN/Creatinine Ratio: 9 — ABNORMAL LOW (ref 12–28)
BUN: 7 mg/dL — ABNORMAL LOW (ref 8–27)
Bilirubin Total: 0.3 mg/dL (ref 0.0–1.2)
CO2: 20 mmol/L (ref 20–29)
Calcium: 10.4 mg/dL — ABNORMAL HIGH (ref 8.7–10.3)
Chloride: 108 mmol/L — ABNORMAL HIGH (ref 96–106)
Creatinine, Ser: 0.82 mg/dL (ref 0.57–1.00)
GFR calc Af Amer: 84 mL/min/{1.73_m2} (ref >59)
GFR calc non Af Amer: 73 mL/min/{1.73_m2} (ref >59)
Globulin, Total: 2.3 g/dL (ref 1.5–4.5)
Glucose: 118 mg/dL — ABNORMAL HIGH (ref 65–99)
Potassium: 4.4 mmol/L (ref 3.5–5.2)
Sodium: 144 mmol/L (ref 134–144)
Total Protein: 6.7 g/dL (ref 6.0–8.5)

## 2019-11-16 ENCOUNTER — Other Ambulatory Visit: Payer: Self-pay | Admitting: Nurse Practitioner

## 2019-11-16 DIAGNOSIS — Z1231 Encounter for screening mammogram for malignant neoplasm of breast: Secondary | ICD-10-CM

## 2019-11-20 ENCOUNTER — Other Ambulatory Visit: Payer: Self-pay | Admitting: Nurse Practitioner

## 2019-11-21 ENCOUNTER — Ambulatory Visit: Payer: Medicare Other | Admitting: *Deleted

## 2019-11-21 DIAGNOSIS — J449 Chronic obstructive pulmonary disease, unspecified: Secondary | ICD-10-CM

## 2019-11-21 DIAGNOSIS — E782 Mixed hyperlipidemia: Secondary | ICD-10-CM

## 2019-11-21 DIAGNOSIS — I10 Essential (primary) hypertension: Secondary | ICD-10-CM

## 2019-11-21 NOTE — Chronic Care Management (AMB) (Signed)
  Chronic Care Management   Follow-up Outreach  11/21/2019 Name: Karla Maxwell MRN: [id number] DOB: 10-07-1950  Referred by: Chevis Pretty, FNP Reason for referral : Chronic Care Management   An unsuccessful telephone follow-up was attempted today. The patient was referred to the case management team for assistance with care management and care coordination.   Follow Up Plan: The care management team will reach out to the patient again over the next 30 days.   [name], BSN, RN-BC Embedded Chronic Care Manager [facility] [place] [facility] Direct Dial: [phone]

## 2019-12-27 ENCOUNTER — Other Ambulatory Visit: Payer: Self-pay

## 2019-12-27 ENCOUNTER — Ambulatory Visit
Admission: RE | Admit: 2019-12-27 | Discharge: 2019-12-27 | Disposition: A | Discharge status: Home / Self Care | Payer: Medicare Other | Source: Ambulatory Visit | Attending: Nurse Practitioner | Admitting: Nurse Practitioner

## 2019-12-27 DIAGNOSIS — Z1231 Encounter for screening mammogram for malignant neoplasm of breast: Secondary | ICD-10-CM

## 2020-01-11 ENCOUNTER — Ambulatory Visit (INDEPENDENT_AMBULATORY_CARE_PROVIDER_SITE_OTHER): Payer: Medicare Other | Admitting: Family

## 2020-01-11 ENCOUNTER — Encounter: Payer: Self-pay | Admitting: Family

## 2020-01-11 DIAGNOSIS — J449 Chronic obstructive pulmonary disease, unspecified: Secondary | ICD-10-CM

## 2020-01-11 DIAGNOSIS — Z720 Tobacco use: Secondary | ICD-10-CM

## 2020-01-11 DIAGNOSIS — J441 Chronic obstructive pulmonary disease with (acute) exacerbation: Secondary | ICD-10-CM

## 2020-01-11 MED ORDER — AZITHROMYCIN 250 MG PO TABS: ORAL_TABLET | ORAL | 0 refills | Status: DC

## 2020-01-11 MED ORDER — PREDNISONE 10 MG (21) PO TBPK: ORAL_TABLET | ORAL | 0 refills | Status: DC

## 2020-01-11 NOTE — Progress Notes (Signed)
Virtual Visit via telephone Note Due to COVID-19 pandemic this visit was conducted virtually. This visit type was conducted due to national recommendations for restrictions regarding the COVID-19 Pandemic (e.g. social distancing, sheltering in place) in an effort to limit this patient's exposure and mitigate transmission in our community. All issues noted in this document were discussed and addressed.  A physical exam was not performed with this format.  I connected with Karla Maxwell on 01/11/20 at 1:54 pm  by telephone and verified that I am speaking with the correct person using two identifiers. Karla Maxwell is currently located at home and no one is currently with her during visit. The provider, Evelina Dun, FNP is located in their office at time of visit.  I discussed the limitations, risks, security and privacy concerns of performing an evaluation and management service by telephone and the availability of in person appointments. I also discussed with the patient that there may be a patient responsible charge related to this service. The patient expressed understanding and agreed to proceed.   History and Present Illness:  Pt call the office today with cough that started Saturday. She has not been COVID tested. She reports she had both COVID vaccines in April 2021. Cough This is a new problem. The current episode started in the past 7 days. The problem has been unchanged. The problem occurs every few minutes. The cough is non-productive. Associated symptoms include chills, ear pain, a fever, headaches, myalgias, nasal congestion, postnasal drip, shortness of breath and wheezing. Pertinent negatives include no ear congestion. The symptoms are aggravated by lying down. Risk factors for lung disease include smoking/tobacco exposure. She has tried rest and OTC cough suppressant for the symptoms. The treatment provided mild relief. Her past medical history is significant for emphysema.       Review of Systems  Constitutional: Positive for chills and fever.  HENT: Positive for ear pain and postnasal drip.   Respiratory: Positive for cough, shortness of breath and wheezing.   Musculoskeletal: Positive for myalgias.  Neurological: Positive for headaches.  All other systems reviewed and are negative.    Observations/Objective: No SOB or distress noted   Assessment and Plan: 1. COPD (chronic obstructive pulmonary disease) with chronic bronchitis (HCC) - Novel Coronavirus, NAA ([facility]) - predniSONE (STERAPRED UNI-PAK 21 TAB) 10 MG (21) TBPK tablet; Use as directed  Dispense: 21 tablet; Refill: 0 - azithromycin (ZITHROMAX) 250 MG tablet; Take 500 mg once, then 250 mg for four days  Dispense: 6 tablet; Refill: 0  2. COPD exacerbation ([place]) - Take meds as prescribed - Use a cool mist humidifier  -Use saline nose sprays frequently -Force fluids -For any cough or congestion  Use plain Mucinex- regular strength or max strength is fine -For fever or aces or pains- take tylenol or ibuprofen. -Throat lozenges if help -Call if symptoms worsen or do not improve  - Novel Coronavirus, NAA ([facility])  3. Tobacco abuse Smoking cessation discussed      I discussed the assessment and treatment plan with the patient. The patient was provided an opportunity to ask questions and all were answered. The patient agreed with the plan and demonstrated an understanding of the instructions.   The patient was advised to call back or seek an in-person evaluation if the symptoms worsen or if the condition fails to improve as anticipated.  The above assessment and management plan was discussed with the patient. The patient verbalized understanding of and has agreed to the management  plan. Patient is aware to call the clinic if symptoms persist or worsen. Patient is aware when to return to the clinic for a follow-up visit. Patient educated on when it is appropriate to go to the emergency  department.   Time call ended:  2:03 pm   I provided 9 minutes of non-face-to-face time during this encounter.    Evelina Dun, FNP

## 2020-01-13 LAB — NOVEL CORONAVIRUS, NAA: SARS-CoV-2, NAA: NOT DETECTED

## 2020-01-13 LAB — SARS-COV-2, NAA 2 DAY TAT

## 2020-01-16 ENCOUNTER — Ambulatory Visit (INDEPENDENT_AMBULATORY_CARE_PROVIDER_SITE_OTHER): Payer: Medicare Other

## 2020-01-16 ENCOUNTER — Ambulatory Visit (INDEPENDENT_AMBULATORY_CARE_PROVIDER_SITE_OTHER): Payer: Medicare Other | Admitting: Nurse Practitioner

## 2020-01-16 ENCOUNTER — Encounter: Payer: Self-pay | Admitting: Nurse Practitioner

## 2020-01-16 ENCOUNTER — Other Ambulatory Visit: Payer: Self-pay

## 2020-01-16 VITALS — BP 160/77 | HR 119 | Temp 97.4°F | Resp 20 | Ht 61.0 in | Wt 154.0 lb

## 2020-01-16 DIAGNOSIS — R0602 Shortness of breath: Secondary | ICD-10-CM

## 2020-01-16 DIAGNOSIS — R05 Cough: Secondary | ICD-10-CM | POA: Diagnosis not present

## 2020-01-16 DIAGNOSIS — R0600 Dyspnea, unspecified: Secondary | ICD-10-CM | POA: Diagnosis not present

## 2020-01-16 NOTE — Patient Instructions (Signed)
Chronic Obstructive Pulmonary Disease Chronic obstructive pulmonary disease (COPD) is a long-term (chronic) lung problem. When you have COPD, it is hard for air to get in and out of your lungs. Usually the condition gets worse over time, and your lungs will never return to normal. There are things you can do to keep yourself as healthy as possible.  Your doctor may treat your condition with: ? Medicines. ? Oxygen. ? Lung surgery.  Your doctor may also recommend: ? Rehabilitation. This includes steps to make your body work better. It may involve a team of specialists. ? Quitting smoking, if you smoke. ? Exercise and changes to your diet. ? Comfort measures (palliative care). Follow these instructions at home: Medicines  Take over-the-counter and prescription medicines only as told by your doctor.  Talk to your doctor before taking any cough or allergy medicines. You may need to avoid medicines that cause your lungs to be dry. Lifestyle  If you smoke, stop. Smoking makes the problem worse. If you need help quitting, ask your doctor.  Avoid being around things that make your breathing worse. This may include smoke, chemicals, and fumes.  Stay active, but remember to rest as well.  Learn and use tips on how to relax.  Make sure you get enough sleep. Most adults need at least 7 hours of sleep every night.  Eat healthy foods. Eat smaller meals more often. Rest before meals. Controlled breathing Learn and use tips on how to control your breathing as told by your doctor. Try:  Breathing in (inhaling) through your nose for 1 second. Then, pucker your lips and breath out (exhale) through your lips for 2 seconds.  Putting one hand on your belly (abdomen). Breathe in slowly through your nose for 1 second. Your hand on your belly should move out. Pucker your lips and breathe out slowly through your lips. Your hand on your belly should move in as you breathe out.  Controlled coughing Learn  and use controlled coughing to clear mucus from your lungs. Follow these steps: 1. Lean your head a little forward. 2. Breathe in deeply. 3. Try to hold your breath for 3 seconds. 4. Keep your mouth slightly open while coughing 2 times. 5. Spit any mucus out into a tissue. 6. Rest and do the steps again 1 or 2 times as needed. General instructions  Make sure you get all the shots (vaccines) that your doctor recommends. Ask your doctor about a flu shot and a pneumonia shot.  Use oxygen therapy and pulmonary rehabilitation if told by your doctor. If you need home oxygen therapy, ask your doctor if you should buy a tool to measure your oxygen level (oximeter).  Make a COPD action plan with your doctor. This helps you to know what to do if you feel worse than usual.  Manage any other conditions you have as told by your doctor.  Avoid going outside when it is very hot, cold, or humid.  Avoid people who have a sickness you can catch (contagious).  Keep all follow-up visits as told by your doctor. This is important. Contact a doctor if:  You cough up more mucus than usual.  There is a change in the color or thickness of the mucus.  It is harder to breathe than usual.  Your breathing is faster than usual.  You have trouble sleeping.  You need to use your medicines more often than usual.  You have trouble doing your normal activities such as getting dressed   or walking around the house. Get help right away if:  You have shortness of breath while resting.  You have shortness of breath that stops you from: ? Being able to talk. ? Doing normal activities.  Your chest hurts for longer than 5 minutes.  Your skin color is more blue than usual.  Your pulse oximeter shows that you have low oxygen for longer than 5 minutes.  You have a fever.  You feel too tired to breathe normally. Summary  Chronic obstructive pulmonary disease (COPD) is a long-term lung problem.  The way your  lungs work will never return to normal. Usually the condition gets worse over time. There are things you can do to keep yourself as healthy as possible.  Take over-the-counter and prescription medicines only as told by your doctor.  If you smoke, stop. Smoking makes the problem worse. This information is not intended to replace advice given to you by your health care provider. Make sure you discuss any questions you have with your health care provider. Document Revised: 04/02/2017 Document Reviewed: 05/25/2016 Elsevier Patient Education  2020 [employer] Inc.  

## 2020-01-16 NOTE — Progress Notes (Signed)
° °  Subjective:    Patient ID: Karla Maxwell, female    DOB: Jul 07, 1950, 69 y.o.   MRN: [id number]   Chief Complaint: Shortness of Breath   HPI Patient comes in today c/o SOB. She was sick about a year and half ago just prior to covid testing. She was in hospital and almost had to be put on ventilator. We truly feel that she probably had covid then. Since then she has frequent SOB that seems to be worsening. She has b=not felt well the last several weeks. Feels really fatigue with DOE.   Review of Systems  Constitutional: Negative for diaphoresis.  Eyes: Negative for pain.  Respiratory: Positive for cough and shortness of breath.   Cardiovascular: Negative for chest pain, palpitations and leg swelling.  Gastrointestinal: Negative for abdominal pain.  Endocrine: Negative for polydipsia.  Skin: Negative for rash.  Neurological: Negative for dizziness, weakness and headaches.  Hematological: Does not bruise/bleed easily.  All other systems reviewed and are negative.      Objective:   Physical Exam Vitals and nursing note reviewed.  Constitutional:      Appearance: She is well-developed.  Pulmonary:     Breath sounds: Examination of the right-upper field reveals wheezing. Examination of the left-upper field reveals wheezing. Examination of the right-middle field reveals wheezing. Examination of the left-middle field reveals wheezing. Examination of the right-lower field reveals rales. Examination of the left-lower field reveals rales. Wheezing and rales present.  Skin:    General: Skin is warm.  Neurological:     General: No focal deficit present.     Mental Status: She is alert.  Psychiatric:        Behavior: Behavior normal.    BP (!) 160/77    Pulse (!) 119    Temp (!) 97.4 F (36.3 C) (Temporal)    Resp 20    Ht 5\' 1"  (1.549 m)    Wt 154 lb (69.9 kg)    SpO2 (!) 86%    BMI 29.10 kg/m   Chest xray- chronic bronchitic changes-Preliminary reading by Ronnald Collum, FNP   [facility]   O2 sat at rest 85-86%     Assessment & Plan:  Karla Maxwell in today with chief complaint of Shortness of Breath   1. SOB (shortness of breath) rest - DG Chest 2 View - For home use only DME oxygen    The above assessment and management plan was discussed with the patient. The patient verbalized understanding of and has agreed to the management plan. Patient is aware to call the clinic if symptoms persist or worsen. Patient is aware when to return to the clinic for a follow-up visit. Patient educated on when it is appropriate to go to the emergency department.   Mary-Margaret Hassell Done, FNP

## 2020-01-17 ENCOUNTER — Telehealth: Payer: Self-pay | Admitting: Nurse Practitioner

## 2020-01-17 DIAGNOSIS — R0602 Shortness of breath: Secondary | ICD-10-CM | POA: Diagnosis not present

## 2020-01-17 NOTE — Telephone Encounter (Signed)
Patient was advised all orders were faxed to [facility] gave her their number to see where they where at inf the processes.

## 2020-02-16 DIAGNOSIS — J449 Chronic obstructive pulmonary disease, unspecified: Secondary | ICD-10-CM | POA: Diagnosis not present

## 2020-02-27 ENCOUNTER — Other Ambulatory Visit: Payer: Self-pay | Admitting: Nurse Practitioner

## 2020-02-27 DIAGNOSIS — E782 Mixed hyperlipidemia: Secondary | ICD-10-CM

## 2020-03-18 DIAGNOSIS — J449 Chronic obstructive pulmonary disease, unspecified: Secondary | ICD-10-CM | POA: Diagnosis not present

## 2020-04-17 DIAGNOSIS — J449 Chronic obstructive pulmonary disease, unspecified: Secondary | ICD-10-CM | POA: Diagnosis not present

## 2020-05-16 ENCOUNTER — Other Ambulatory Visit: Payer: Self-pay

## 2020-05-16 ENCOUNTER — Ambulatory Visit (INDEPENDENT_AMBULATORY_CARE_PROVIDER_SITE_OTHER): Payer: Medicare Other | Admitting: Nurse Practitioner

## 2020-05-16 ENCOUNTER — Ambulatory Visit (INDEPENDENT_AMBULATORY_CARE_PROVIDER_SITE_OTHER): Payer: Medicare Other

## 2020-05-16 ENCOUNTER — Encounter: Payer: Self-pay | Admitting: Nurse Practitioner

## 2020-05-16 ENCOUNTER — Other Ambulatory Visit: Payer: Self-pay | Admitting: Nurse Practitioner

## 2020-05-16 VITALS — BP 138/54 | HR 77 | Temp 97.6°F | Ht 61.0 in | Wt 149.2 lb

## 2020-05-16 DIAGNOSIS — J4489 Other specified chronic obstructive pulmonary disease: Secondary | ICD-10-CM

## 2020-05-16 DIAGNOSIS — I10 Essential (primary) hypertension: Secondary | ICD-10-CM

## 2020-05-16 DIAGNOSIS — M5441 Lumbago with sciatica, right side: Secondary | ICD-10-CM

## 2020-05-16 DIAGNOSIS — D696 Thrombocytopenia, unspecified: Secondary | ICD-10-CM | POA: Diagnosis not present

## 2020-05-16 DIAGNOSIS — E8881 Metabolic syndrome: Secondary | ICD-10-CM

## 2020-05-16 DIAGNOSIS — M5442 Lumbago with sciatica, left side: Secondary | ICD-10-CM

## 2020-05-16 DIAGNOSIS — J449 Chronic obstructive pulmonary disease, unspecified: Secondary | ICD-10-CM | POA: Diagnosis not present

## 2020-05-16 DIAGNOSIS — Z720 Tobacco use: Secondary | ICD-10-CM

## 2020-05-16 DIAGNOSIS — E782 Mixed hyperlipidemia: Secondary | ICD-10-CM

## 2020-05-16 DIAGNOSIS — F330 Major depressive disorder, recurrent, mild: Secondary | ICD-10-CM

## 2020-05-16 DIAGNOSIS — E039 Hypothyroidism, unspecified: Secondary | ICD-10-CM | POA: Diagnosis not present

## 2020-05-16 DIAGNOSIS — R739 Hyperglycemia, unspecified: Secondary | ICD-10-CM | POA: Diagnosis not present

## 2020-05-16 DIAGNOSIS — K219 Gastro-esophageal reflux disease without esophagitis: Secondary | ICD-10-CM | POA: Diagnosis not present

## 2020-05-16 DIAGNOSIS — G8929 Other chronic pain: Secondary | ICD-10-CM

## 2020-05-16 DIAGNOSIS — M8588 Other specified disorders of bone density and structure, other site: Secondary | ICD-10-CM

## 2020-05-16 DIAGNOSIS — Z780 Asymptomatic menopausal state: Secondary | ICD-10-CM | POA: Diagnosis not present

## 2020-05-16 DIAGNOSIS — M8580 Other specified disorders of bone density and structure, unspecified site: Secondary | ICD-10-CM

## 2020-05-16 LAB — BAYER DCA HB A1C WAIVED: HB A1C (BAYER DCA - WAIVED): 6.1 % (ref <7.0)

## 2020-05-16 MED ORDER — AMLODIPINE BESYLATE 10 MG PO TABS
10.0000 mg | ORAL_TABLET | Freq: Every day | ORAL | 1 refills | Status: DC
Start: 2020-05-16 — End: 2020-11-11

## 2020-05-16 MED ORDER — FENOFIBRATE MICRONIZED 134 MG PO CAPS: ORAL_CAPSULE | ORAL | 1 refills | Status: DC

## 2020-05-16 MED ORDER — BUDESONIDE-FORMOTEROL FUMARATE 160-4.5 MCG/ACT IN AERO: INHALATION_SPRAY | RESPIRATORY_TRACT | 1 refills | Status: DC

## 2020-05-16 MED ORDER — TIZANIDINE HCL 4 MG PO TABS: 4.0000 mg | ORAL_TABLET | Freq: Three times a day (TID) | ORAL | 1 refills | Status: DC | PRN

## 2020-05-16 MED ORDER — OMEPRAZOLE 40 MG PO CPDR: 40.0000 mg | DELAYED_RELEASE_CAPSULE | Freq: Every day | ORAL | 1 refills | Status: DC

## 2020-05-16 MED ORDER — LEVOTHYROXINE SODIUM 75 MCG PO TABS
ORAL_TABLET | ORAL | 1 refills | Status: DC
Start: 2020-05-16 — End: 2020-08-15

## 2020-05-16 MED ORDER — ATORVASTATIN CALCIUM 80 MG PO TABS
80.0000 mg | ORAL_TABLET | Freq: Every day | ORAL | 1 refills | Status: DC
Start: 2020-05-16 — End: 2020-08-15

## 2020-05-16 MED ORDER — MONTELUKAST SODIUM 10 MG PO TABS
10.0000 mg | ORAL_TABLET | Freq: Every day | ORAL | 1 refills | Status: DC
Start: 2020-05-16 — End: 2020-08-15
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
## 2020-05-16 NOTE — Patient Instructions (Signed)
Chronic Back Pain When back pain lasts longer than 3 months, it is called chronic back pain. Pain may get worse at certain times (flare-ups). There are things you can do at home to manage your pain. Follow these instructions at home: Pay attention to any changes in your symptoms. Take these actions to help with your pain: Managing pain and stiffness  If told, put ice on the painful area. Your doctor may tell you to use ice for 24-48 hours after the flare-up starts. To do this: ? Put ice in a plastic bag. ? Place a towel between your skin and the bag. ? Leave the ice on for 20 minutes, 2-3 times a day.  If told, put heat on the painful area. Do this as often as told by your doctor. Use the heat source that your doctor recommends, such as a moist heat pack or a heating pad. ? Place a towel between your skin and the heat source. ? Leave the heat on for 20-30 minutes. ? Take off the heat if your skin turns bright red. This is especially important if you are unable to feel pain, heat, or cold. You may have a greater risk of getting burned.  Soak in a warm bath. This can help relieve pain.      Activity  Avoid bending and other activities that make pain worse.  When standing: ? Keep your upper back and neck straight. ? Keep your shoulders pulled back. ? Avoid slouching.  When sitting: ? Keep your back straight. ? Relax your shoulders. Do not round your shoulders or pull them backward.  Do not sit or stand in one place for long periods of time.  Take short rest breaks during the day. Lying down or standing is usually better than sitting. Resting can help relieve pain.  When sitting or lying down for a long time, do some mild activity or stretching. This will help to prevent stiffness and pain.  Get regular exercise. Ask your doctor what activities are safe for you.  Do not lift anything that is heavier than 10 lb (4.5 kg) or the limit that you are told, until your doctor says that it  is safe.  To prevent injury when you lift things: ? Bend your knees. ? Keep the weight close to your body. ? Avoid twisting.  Sleep on a firm mattress. Try lying on your side with your knees slightly bent. If you lie on your back, put a pillow under your knees.   Medicines  Treatment may include medicines for pain and swelling taken by mouth or put on the skin, prescription pain medicine, or muscle relaxants.  Take over-the-counter and prescription medicines only as told by your doctor.  Ask your doctor if the medicine prescribed to you: ? Requires you to avoid driving or using machinery. ? Can cause trouble pooping (constipation). You may need to take these actions to prevent or treat trouble pooping:  Drink enough fluid to keep your pee (urine) pale yellow.  Take over-the-counter or prescription medicines.  Eat foods that are high in fiber. These include beans, whole grains, and fresh fruits and vegetables.  Limit foods that are high in fat and sugars. These include fried or sweet foods. General instructions  Do not use any products that contain nicotine or tobacco, such as cigarettes, e-cigarettes, and chewing tobacco. If you need help quitting, ask your doctor.  Keep all follow-up visits as told by your doctor. This is important. Contact a doctor if:    Your pain does not get better with rest or medicine.  Your pain gets worse, or you have new pain.  You have a high fever.  You lose weight very quickly.  You have trouble doing your normal activities. Get help right away if:  One or both of your legs or feet feel weak.  One or both of your legs or feet lose feeling (have numbness).  You have trouble controlling when you poop (have a bowel movement) or pee (urinate).  You have bad back pain and: ? You feel like you may vomit (nauseous), or you vomit. ? You have pain in your belly (abdomen). ? You have shortness of breath. ? You faint. Summary  When back pain  lasts longer than 3 months, it is called chronic back pain.  Pain may get worse at certain times (flare-ups).  Use ice and heat as told by your doctor. Your doctor may tell you to use ice after flare-ups. This information is not intended to replace advice given to you by your health care provider. Make sure you discuss any questions you have with your health care provider. Document Revised: 05/31/2019 Document Reviewed: 05/31/2019 Elsevier Patient Education  2021 Elsevier Inc.  

## 2020-05-17 LAB — CMP14+EGFR
ALT: 13 IU/L (ref 0–32)
AST: 18 IU/L (ref 0–40)
Albumin/Globulin Ratio: 2 (ref 1.2–2.2)
Albumin: 4.7 g/dL (ref 3.8–4.8)
Alkaline Phosphatase: 52 IU/L (ref 44–121)
BUN/Creatinine Ratio: 15 (ref 12–28)
BUN: 11 mg/dL (ref 8–27)
Bilirubin Total: 0.2 mg/dL (ref 0.0–1.2)
CO2: 20 mmol/L (ref 20–29)
Calcium: 10.4 mg/dL — ABNORMAL HIGH (ref 8.7–10.3)
Chloride: 104 mmol/L (ref 96–106)
Creatinine, Ser: 0.71 mg/dL (ref 0.57–1.00)
GFR calc Af Amer: 100 mL/min/{1.73_m2} (ref >59)
GFR calc non Af Amer: 87 mL/min/{1.73_m2} (ref >59)
Globulin, Total: 2.4 g/dL (ref 1.5–4.5)
Glucose: 105 mg/dL — ABNORMAL HIGH (ref 65–99)
Potassium: 4.4 mmol/L (ref 3.5–5.2)
Sodium: 142 mmol/L (ref 134–144)
Total Protein: 7.1 g/dL (ref 6.0–8.5)

## 2020-05-17 LAB — CBC WITH DIFFERENTIAL/PLATELET
Basophils Absolute: 0.1 10*3/uL (ref 0.0–0.2)
Basos: 1 %
EOS (ABSOLUTE): 0.2 10*3/uL (ref 0.0–0.4)
Eos: 2 %
Hematocrit: 36.5 % (ref 34.0–46.6)
Hemoglobin: 11.8 g/dL (ref 11.1–15.9)
Immature Grans (Abs): 0 10*3/uL (ref 0.0–0.1)
Immature Granulocytes: 0 %
Lymphocytes Absolute: 3.9 10*3/uL — ABNORMAL HIGH (ref 0.7–3.1)
Lymphs: 41 %
MCH: 27.5 pg (ref 26.6–33.0)
MCHC: 32.3 g/dL (ref 31.5–35.7)
MCV: 85 fL (ref 79–97)
Monocytes Absolute: 0.8 10*3/uL (ref 0.1–0.9)
Monocytes: 8 %
Neutrophils Absolute: 4.7 10*3/uL (ref 1.4–7.0)
Neutrophils: 48 %
Platelets: 501 10*3/uL — ABNORMAL HIGH (ref 150–450)
RBC: 4.29 x10E6/uL (ref 3.77–5.28)
RDW: 14.9 % (ref 11.7–15.4)
WBC: 9.6 10*3/uL (ref 3.4–10.8)

## 2020-05-17 LAB — LIPID PANEL
Chol/HDL Ratio: 2.8 ratio (ref 0.0–4.4)
Cholesterol, Total: 135 mg/dL (ref 100–199)
HDL: 49 mg/dL (ref >39)
LDL Chol Calc (NIH): 64 mg/dL (ref 0–99)
Triglycerides: 125 mg/dL (ref 0–149)
VLDL Cholesterol Cal: 22 mg/dL (ref 5–40)

## 2020-05-17 LAB — THYROID PANEL WITH TSH
Free Thyroxine Index: 2.7 (ref 1.2–4.9)
T3 Uptake Ratio: 25 % (ref 24–39)
T4, Total: 10.6 ug/dL (ref 4.5–12.0)
TSH: 1.62 u[IU]/mL (ref 0.450–4.500)

## 2020-05-17 NOTE — Addendum Note (Signed)
Addended by: Chevis Pretty on: 05/17/2020 12:15 PM   Modules accepted: Orders

## 2020-05-18 DIAGNOSIS — J449 Chronic obstructive pulmonary disease, unspecified: Secondary | ICD-10-CM | POA: Diagnosis not present

## 2020-06-07 ENCOUNTER — Encounter (HOSPITAL_COMMUNITY): Payer: Self-pay | Admitting: Hematology and Oncology

## 2020-06-07 ENCOUNTER — Other Ambulatory Visit: Payer: Self-pay

## 2020-06-07 ENCOUNTER — Inpatient Hospital Stay (HOSPITAL_COMMUNITY): Payer: Medicare Other | Attending: Hematology and Oncology | Admitting: Hematology and Oncology

## 2020-06-07 ENCOUNTER — Inpatient Hospital Stay (HOSPITAL_COMMUNITY): Payer: Medicare Other

## 2020-06-07 VITALS — BP 162/62 | HR 93 | Temp 98.8°F | Resp 17 | Ht 61.0 in | Wt 160.2 lb

## 2020-06-07 DIAGNOSIS — I10 Essential (primary) hypertension: Secondary | ICD-10-CM | POA: Diagnosis not present

## 2020-06-07 DIAGNOSIS — D75839 Thrombocytosis, unspecified: Secondary | ICD-10-CM

## 2020-06-07 DIAGNOSIS — Z89201 Acquired absence of right upper limb, unspecified level: Secondary | ICD-10-CM | POA: Insufficient documentation

## 2020-06-07 DIAGNOSIS — E785 Hyperlipidemia, unspecified: Secondary | ICD-10-CM | POA: Insufficient documentation

## 2020-06-07 DIAGNOSIS — J449 Chronic obstructive pulmonary disease, unspecified: Secondary | ICD-10-CM | POA: Diagnosis not present

## 2020-06-07 DIAGNOSIS — E079 Disorder of thyroid, unspecified: Secondary | ICD-10-CM | POA: Insufficient documentation

## 2020-06-07 DIAGNOSIS — Z780 Asymptomatic menopausal state: Secondary | ICD-10-CM | POA: Diagnosis not present

## 2020-06-07 DIAGNOSIS — Z90710 Acquired absence of both cervix and uterus: Secondary | ICD-10-CM | POA: Diagnosis not present

## 2020-06-07 DIAGNOSIS — E611 Iron deficiency: Secondary | ICD-10-CM | POA: Diagnosis not present

## 2020-06-07 DIAGNOSIS — F17210 Nicotine dependence, cigarettes, uncomplicated: Secondary | ICD-10-CM | POA: Diagnosis not present

## 2020-06-07 DIAGNOSIS — Z8619 Personal history of other infectious and parasitic diseases: Secondary | ICD-10-CM | POA: Diagnosis not present

## 2020-06-07 LAB — IRON AND TIBC
Iron: 44 ug/dL (ref 28–170)
Saturation Ratios: 8 % — ABNORMAL LOW (ref 10.4–31.8)
TIBC: 540 ug/dL — ABNORMAL HIGH (ref 250–450)
UIBC: 496 ug/dL

## 2020-06-07 LAB — CBC WITH DIFFERENTIAL/PLATELET
Abs Immature Granulocytes: 0.06 10*3/uL (ref 0.00–0.07)
Basophils Absolute: 0.1 10*3/uL (ref 0.0–0.1)
Basophils Relative: 1 %
Eosinophils Absolute: 0.1 10*3/uL (ref 0.0–0.5)
Eosinophils Relative: 1 %
HCT: 36.2 % (ref 36.0–46.0)
Hemoglobin: 11.6 g/dL — ABNORMAL LOW (ref 12.0–15.0)
Immature Granulocytes: 1 %
Lymphocytes Relative: 33 %
Lymphs Abs: 3.9 10*3/uL (ref 0.7–4.0)
MCH: 28.1 pg (ref 26.0–34.0)
MCHC: 32 g/dL (ref 30.0–36.0)
MCV: 87.7 fL (ref 80.0–100.0)
Monocytes Absolute: 0.8 10*3/uL (ref 0.1–1.0)
Monocytes Relative: 6 %
Neutro Abs: 6.7 10*3/uL (ref 1.7–7.7)
Neutrophils Relative %: 58 %
Platelets: 464 10*3/uL — ABNORMAL HIGH (ref 150–400)
RBC: 4.13 MIL/uL (ref 3.87–5.11)
RDW: 17.4 % — ABNORMAL HIGH (ref 11.5–15.5)
WBC: 11.7 10*3/uL — ABNORMAL HIGH (ref 4.0–10.5)
nRBC: 0 % (ref 0.0–0.2)

## 2020-06-07 LAB — FERRITIN: Ferritin: 11 ng/mL (ref 11–307)
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
## 2020-06-07 NOTE — Addendum Note (Signed)
Addended by: Donetta Potts on: 06/07/2020 01:49 PM   Modules accepted: Orders

## 2020-06-07 NOTE — Addendum Note (Signed)
Addended by: Adaline Sill on: 06/07/2020 01:45 PM   Modules accepted: Orders

## 2020-06-13 ENCOUNTER — Telehealth: Payer: Self-pay

## 2020-06-13 ENCOUNTER — Encounter (INDEPENDENT_AMBULATORY_CARE_PROVIDER_SITE_OTHER): Payer: Self-pay | Admitting: *Deleted

## 2020-06-13 LAB — MISC LABCORP TEST (SEND OUT): Labcorp test code: 489150

## 2020-06-13 NOTE — Telephone Encounter (Signed)
Spoke with patient- was told that sulfate and sulfa are 2 different things- go ahead and start in ferrous sulfate

## 2020-06-13 NOTE — Telephone Encounter (Signed)
Patient is concerned because her hematologist wants her to start Ferrous Sulfate 65 mg per day and she is allergic to Sulfa.  I don't think there is sulfa in the iron, right?

## 2020-06-14 LAB — JAK2 GENOTYPR

## 2020-06-17 LAB — CALRETICULIN (CALR) MUTATION ANALYSIS

## 2020-06-18 DIAGNOSIS — J449 Chronic obstructive pulmonary disease, unspecified: Secondary | ICD-10-CM | POA: Diagnosis not present

## 2020-07-08 ENCOUNTER — Other Ambulatory Visit: Payer: Self-pay

## 2020-07-08 ENCOUNTER — Inpatient Hospital Stay (HOSPITAL_COMMUNITY): Payer: Medicare Other | Attending: Hematology and Oncology | Admitting: Hematology

## 2020-07-08 VITALS — BP 132/68 | HR 88 | Temp 98.7°F | Resp 17 | Wt 144.5 lb

## 2020-07-08 DIAGNOSIS — D75839 Thrombocytosis, unspecified: Secondary | ICD-10-CM

## 2020-07-08 DIAGNOSIS — F17210 Nicotine dependence, cigarettes, uncomplicated: Secondary | ICD-10-CM | POA: Insufficient documentation

## 2020-07-08 DIAGNOSIS — D75838 Other thrombocytosis: Secondary | ICD-10-CM | POA: Insufficient documentation

## 2020-07-08 DIAGNOSIS — R197 Diarrhea, unspecified: Secondary | ICD-10-CM | POA: Diagnosis not present

## 2020-07-08 DIAGNOSIS — E079 Disorder of thyroid, unspecified: Secondary | ICD-10-CM | POA: Insufficient documentation
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
## 2020-07-08 NOTE — Patient Instructions (Signed)
[place] at [facility] Discharge Instructions  You were seen today by Dr. Delton Coombes. He went over your recent results; several genetic mutations were checked for myeloproliferative disorders and they were all negative. You will be scheduled to have a low-dose CT scan of your chest done before your next visit. Dr. Delton Coombes will see you back in 3 months for labs and follow up.   Thank you for choosing [place] at [facility] to provide your oncology and hematology care.  To afford each patient quality time with our provider, please arrive at least 15 minutes before your scheduled appointment time.   If you have a lab appointment with the [facility] please come in thru the Main Entrance and check in at the main information desk  You need to re-schedule your appointment should you arrive 10 or more minutes late.  We strive to give you quality time with our providers, and arriving late affects you and other patients whose appointments are after yours.  Also, if you no show three or more times for appointments you may be dismissed from the clinic at the providers discretion.     Again, thank you for choosing [facility].  Our hope is that these requests will decrease the amount of time that you wait before being seen by our physicians.       _____________________________________________________________  Should you have questions after your visit to [facility], please contact our office at [phone] between the hours of 8:00 a.m. and 4:30 p.m.  Voicemails left after 4:00 p.m. will not be returned until the following business day.  For prescription refill requests, have your pharmacy contact our office and allow 72 hours.    Cancer Center Support Programs:   > Cancer Support Group  2nd Tuesday of the month 1pm-2pm, Journey Room

## 2020-07-16 DIAGNOSIS — J449 Chronic obstructive pulmonary disease, unspecified: Secondary | ICD-10-CM | POA: Diagnosis not present

## 2020-08-05 ENCOUNTER — Ambulatory Visit (INDEPENDENT_AMBULATORY_CARE_PROVIDER_SITE_OTHER): Payer: Medicare Other | Admitting: Nurse Practitioner

## 2020-08-05 ENCOUNTER — Ambulatory Visit (INDEPENDENT_AMBULATORY_CARE_PROVIDER_SITE_OTHER): Payer: Medicare Other

## 2020-08-05 ENCOUNTER — Other Ambulatory Visit: Payer: Self-pay

## 2020-08-05 ENCOUNTER — Encounter: Payer: Self-pay | Admitting: Nurse Practitioner

## 2020-08-05 ENCOUNTER — Ambulatory Visit: Payer: Medicare Other | Admitting: Nurse Practitioner

## 2020-08-05 ENCOUNTER — Telehealth: Payer: Self-pay

## 2020-08-05 VITALS — BP 136/73 | HR 93 | Temp 97.4°F | Ht 61.0 in | Wt 145.4 lb

## 2020-08-05 DIAGNOSIS — M5441 Lumbago with sciatica, right side: Secondary | ICD-10-CM

## 2020-08-05 DIAGNOSIS — M5442 Lumbago with sciatica, left side: Secondary | ICD-10-CM

## 2020-08-05 DIAGNOSIS — G8929 Other chronic pain: Secondary | ICD-10-CM

## 2020-08-05 DIAGNOSIS — R109 Unspecified abdominal pain: Secondary | ICD-10-CM | POA: Diagnosis not present

## 2020-08-05 DIAGNOSIS — R1032 Left lower quadrant pain: Secondary | ICD-10-CM

## 2020-08-05 DIAGNOSIS — M1612 Unilateral primary osteoarthritis, left hip: Secondary | ICD-10-CM | POA: Diagnosis not present

## 2020-08-05 MED ORDER — PREDNISONE 10 MG (21) PO TBPK: ORAL_TABLET | ORAL | 0 refills | Status: DC

## 2020-08-05 MED ORDER — PREDNISONE 20 MG PO TABS: ORAL_TABLET | ORAL | 0 refills | Status: DC

## 2020-08-05 MED ORDER — METHYLPREDNISOLONE ACETATE 40 MG/ML IJ SUSP
40.0000 mg | Freq: Once | INTRAMUSCULAR | Status: AC
  Administered 2020-08-05: 40 mg via INTRAMUSCULAR

## 2020-08-05 NOTE — Telephone Encounter (Signed)
Returned patients call - has appointment today.

## 2020-08-05 NOTE — Progress Notes (Signed)
Subjective:    Patient ID: Karla Maxwell, female    DOB: [birth year], 70 y.o.   MRN: [id number]  Pt presents today with left sided hip pain that started Saturday. She got up Saturday morning and the pain began then. She has been having pain from her "side all the way down." Pain radiates down to her knee and foot. She has been taking tylenol but isnt sure it has helped. She has not taken any pain medicine today. She has been using heating pad this past weekend and today she was using ice packs. She denies any recent injury.   Of note, she has stopped taking her Fe+ supplement due to constipation.   Review of Systems  Constitutional: Negative for chills, fatigue and fever.  Respiratory: Negative for cough, shortness of breath and wheezing.   Cardiovascular: Negative for chest pain and palpitations.  Gastrointestinal: Positive for abdominal pain and constipation. Negative for diarrhea.  Genitourinary: Negative for difficulty urinating and dysuria.  Musculoskeletal: Positive for arthralgias and back pain.  Neurological: Negative for dizziness, light-headedness and headaches.         Objective:   Physical Exam Cardiovascular:     Heart sounds: Normal heart sounds.  Pulmonary:     Effort: Pulmonary effort is normal.     Breath sounds: Normal breath sounds.  Abdominal:     General: There is distension.     Palpations: Abdomen is soft.     Tenderness: There is abdominal tenderness.  Musculoskeletal:     Left hip: Tenderness present. Decreased range of motion.     Comments: Unable to tolerate raised left leg lifts, hip abduction and adduction.   Skin:    General: Skin is warm and dry.     Capillary Refill: Capillary refill takes less than 2 seconds.  Neurological:     Mental Status: She is alert and oriented to person, place, and time.  Psychiatric:        Mood and Affect: Mood normal.        Behavior: Behavior normal.    BP 136/73   Pulse 93   Temp (!) 97.4 F (36.3 C)  (Temporal)   Ht 5\' 1"  (1.549 m)   Wt 145 lb 6.4 oz (66 kg)   SpO2 94%   BMI 27.47 kg/m      Assessment & Plan:  [name] in today with chief complaint of Hip Pain (Patient states she has been having left hip pain that shoots down her leg x 2 days. )   1. Chronic bilateral low back pain with bilateral sciatica Moist heat rest - DG HIP UNILAT W OR W/O PELVIS 2-3 VIEWS LEFT - predniSONE (STERAPRED UNI-PAK 21 TAB) 10 MG (21) TBPK tablet; As directed x 6 days  Dispense: 21 tablet; Refill: 0 - predniSONE (DELTASONE) 20 MG tablet; 2 po at sametime daily for 5 days-  Dispense: 10 tablet; Refill: 0  2. Left lower quadrant abdominal pain  - DG Abd 1 View  3 Constipation increas fiber in diet Force flids Milk  Of magnesia and prune juice Start miralax daily after good results from MOM and pune juice  The above assessment and management plan was discussed with the patient. The patient verbalized understanding of and has agreed to the management plan. Patient is aware to call the clinic if symptoms persist or worsen. Patient is aware when to return to the clinic for a follow-up visit. Patient educated on when it is appropriate to go to  the emergency department.   Mary-Margaret Hassell Done, FNP

## 2020-08-05 NOTE — Patient Instructions (Signed)
Sciatica  Sciatica is pain, weakness, tingling, or loss of feeling (numbness) along the sciatic nerve. The sciatic nerve starts in the lower back and goes down the back of each leg. Sciatica usually goes away on its own or with treatment. Sometimes, sciatica may come back (recur). What are the causes? This condition happens when the sciatic nerve is pinched or has pressure put on it. This may be the result of:  A disk in between the bones of the spine bulging out too far (herniated disk).  Changes in the spinal disks that occur with aging.  A condition that affects a muscle in the butt.  Extra bone growth near the sciatic nerve.  A break (fracture) of the area between your hip bones (pelvis).  Pregnancy.  Tumor. This is rare. What increases the risk? You are more likely to develop this condition if you:  Play sports that put pressure or stress on the spine.  Have poor strength and ease of movement (flexibility).  Have had a back injury in the past.  Have had back surgery.  Sit for long periods of time.  Do activities that involve bending or lifting over and over again.  Are very overweight (obese). What are the signs or symptoms? Symptoms can vary from mild to very bad. They may include:  Any of these problems in the lower back, leg, hip, or butt: ? Mild tingling, loss of feeling, or dull aches. ? Burning sensations. ? Sharp pains.  Loss of feeling in the back of the calf or the sole of the foot.  Leg weakness.  Very bad back pain that makes it hard to move. These symptoms may get worse when you cough, sneeze, or laugh. They may also get worse when you sit or stand for long periods of time. How is this treated? This condition often gets better without any treatment. However, treatment may include:  Changing or cutting back on physical activity when you have pain.  Doing exercises and stretching.  Putting ice or heat on the affected area.  Medicines that  help: ? To relieve pain and swelling. ? To relax your muscles.  Shots (injections) of medicines that help to relieve pain, irritation, and swelling.  Surgery. Follow these instructions at home: Medicines  Take over-the-counter and prescription medicines only as told by your doctor.  Ask your doctor if the medicine prescribed to you: ? Requires you to avoid driving or using heavy machinery. ? Can cause trouble pooping (constipation). You may need to take these steps to prevent or treat trouble pooping:  Drink enough fluids to keep your pee (urine) pale yellow.  Take over-the-counter or prescription medicines.  Eat foods that are high in fiber. These include beans, whole grains, and fresh fruits and vegetables.  Limit foods that are high in fat and sugar. These include fried or sweet foods. Managing pain  If told, put ice on the affected area. ? Put ice in a plastic bag. ? Place a towel between your skin and the bag. ? Leave the ice on for 20 minutes, 2-3 times a day.  If told, put heat on the affected area. Use the heat source that your doctor tells you to use, such as a moist heat pack or a heating pad. ? Place a towel between your skin and the heat source. ? Leave the heat on for 20-30 minutes. ? Remove the heat if your skin turns bright red. This is very important if you are unable to feel pain,   heat, or cold. You may have a greater risk of getting burned.      Activity  Return to your normal activities as told by your doctor. Ask your doctor what activities are safe for you.  Avoid activities that make your symptoms worse.  Take short rests during the day. ? When you rest for a long time, do some physical activity or stretching between periods of rest. ? Avoid sitting for a long time without moving. Get up and move around at least one time each hour.  Exercise and stretch regularly, as told by your doctor.  Do not lift anything that is heavier than 10 lb (4.5 kg)  while you have symptoms of sciatica. ? Avoid lifting heavy things even when you do not have symptoms. ? Avoid lifting heavy things over and over.  When you lift objects, always lift in a way that is safe for your body. To do this, you should: ? Bend your knees. ? Keep the object close to your body. ? Avoid twisting.   General instructions  Stay at a healthy weight.  Wear comfortable shoes that support your feet. Avoid wearing high heels.  Avoid sleeping on a mattress that is too soft or too hard. You might have less pain if you sleep on a mattress that is firm enough to support your back.  Keep all follow-up visits as told by your doctor. This is important. Contact a doctor if:  You have pain that: ? Wakes you up when you are sleeping. ? Gets worse when you lie down. ? Is worse than the pain you have had in the past. ? Lasts longer than 4 weeks.  You lose weight without trying. Get help right away if:  You cannot control when you pee (urinate) or poop (have a bowel movement).  You have weakness in any of these areas and it gets worse: ? Lower back. ? The area between your hip bones. ? Butt. ? Legs.  You have redness or swelling of your back.  You have a burning feeling when you pee. Summary  Sciatica is pain, weakness, tingling, or loss of feeling (numbness) along the sciatic nerve.  This condition happens when the sciatic nerve is pinched or has pressure put on it.  Sciatica can cause pain, tingling, or loss of feeling (numbness) in the lower back, legs, hips, and butt.  Treatment often includes rest, exercise, medicines, and putting ice or heat on the affected area. This information is not intended to replace advice given to you by your health care provider. Make sure you discuss any questions you have with your health care provider. Document Revised: 05/09/2018 Document Reviewed: 05/09/2018 Elsevier Patient Education  2021 [employer]  

## 2020-08-12 ENCOUNTER — Encounter: Payer: Self-pay | Admitting: Nurse Practitioner

## 2020-08-12 ENCOUNTER — Ambulatory Visit (INDEPENDENT_AMBULATORY_CARE_PROVIDER_SITE_OTHER): Payer: Medicare Other | Admitting: Nurse Practitioner

## 2020-08-12 ENCOUNTER — Other Ambulatory Visit: Payer: Self-pay

## 2020-08-12 VITALS — BP 120/57 | HR 81 | Temp 97.7°F | Resp 20 | Ht 61.0 in | Wt 147.0 lb

## 2020-08-12 DIAGNOSIS — M549 Dorsalgia, unspecified: Secondary | ICD-10-CM

## 2020-08-12 DIAGNOSIS — M5430 Sciatica, unspecified side: Secondary | ICD-10-CM

## 2020-08-12 MED ORDER — HYDROCODONE-ACETAMINOPHEN 5-325 MG PO TABS: 1.0000 | ORAL_TABLET | Freq: Four times a day (QID) | ORAL | 0 refills | Status: DC | PRN

## 2020-08-12 MED ORDER — KETOROLAC TROMETHAMINE 60 MG/2ML IM SOLN
60.0000 mg | Freq: Once | INTRAMUSCULAR | Status: AC
  Administered 2020-08-12: 60 mg via INTRAMUSCULAR

## 2020-08-12 NOTE — Patient Instructions (Signed)
https://doi.[email or web address]">  Managing Chronic Back Pain Chronic back pain is back pain that lasts for 12 weeks or longer. It often affects the lower back. Back pain may feel like a muscle ache or a sharp, stabbing pain. It can be mild, moderate, or severe. If you have been diagnosed with chronic back pain, there are things you can do to manage your symptoms. You may have to try different things to see what works best for you. Your health care provider may also give you specific instructions. How to manage lifestyle changes Treating chronic back pain often starts with rest and pain relief, followed by exercises to restore movement and strength to your back (physical therapy). You may need surgery if other treatments do not help, or if your pain is caused by a condition or an injury. Follow your treatment plan as told by your health care provider. This may include:  Relaxation techniques.  Talk therapy or counseling with a mental health specialist. A form of talk therapy called cognitive behavioral therapy (CBT) can be especially helpful. This therapy helps you set goals and follow up on the changes that you make.  Acupuncture or massage therapy.  Local electrical stimulation.  Injections. These deliver numbing or pain-relieving medicines into your spine or the area of pain. How to recognize changes in your chronic back pain Your condition may improve with treatment. However, back pain may not go away or may get worse over time. Watch your symptoms carefully and let your health care provider know if your symptoms get worse or do not improve. Your back pain may be getting worse if you have:  Pain that begins to cause problems with posture.  Pain that gets worse when you are sitting, standing, walking, bending, or lifting.  Pain that affects you while you are active, or at rest, or both.  Pain that eventually makes it hard to move around (limits mobility).  Pain that occurs with  fever, weight loss, or difficulty urinating.  Pain that causes numbness and tingling. How to use body mechanics and posture to help with pain Healthy body mechanics and good posture can help to relieve stress on your back. Body mechanics refers to the movements and positions of your body during your daily activities. Posture is part of body mechanics. Good posture means:  Your spine is in its natural S-curve, or neutral, position.  Your shoulders are pulled back slightly.  Your head is not tipped forward. Follow these guidelines to improve your posture and body mechanics in your everyday activities. Standing  When standing, keep your spine neutral and your feet about hip-width apart. Keep your knees slightly bent. Your ears, shoulders, and hips should line up.  When you do a task in which you stand in one place for a long time, place one foot on a stable object that is 2-4 inches (5-10 cm) high, such as a footstool. This helps keep your spine neutral.   Sitting  When sitting, keep your spine neutral and your feet flat on the floor. Use a footrest, if necessary, and keep your thighs parallel to the floor. Avoid rounding your shoulders, and avoid tilting your head forward.  When working at a desk or a computer, keep your desk at a height where your hands are slightly lower than your elbows. Slide your chair under your desk so you are close enough to maintain good posture.  When working at a computer, place your monitor at a height where you are looking straight ahead and  you do not have to tilt your head forward or downward to view the screen.   Lifting  Keep your feet at least shoulder-width apart and tighten the muscles of your abdomen.  Bend your knees and hips and keep your spine neutral. Be sure to lift using the strength of your legs, not your back. Do not lock your knees straight out.  Always ask for help to lift heavy or awkward objects.   Resting  When lying down and resting,  avoid positions that are most painful.  If you have pain with activities such as sitting, bending, stooping, or squatting, lie in a position in which your body does not bend very much. For example, avoid curling up on your side with your arms and knees near your chest (fetal position).  If you have pain with activities such as standing for a long time or reaching with your arms, lie with your spine in a neutral position and bend your knees slightly. Try: ? Lying on your side with a pillow between your knees. ? Lying on your back with a pillow under your knees.   Follow these instructions at home: Medicines  Treatment may include over-the-counter or prescription medicines for pain and inflammation that are taken by mouth or applied to the skin. Another treatment may include muscle relaxants. Take over-the-counter and prescription medicines only as told by your health care provider.  Ask your health care provider if the medicine prescribed to you: ? Requires you to avoid driving or using machinery. ? Can cause constipation. You may need to take these actions to prevent or treat constipation:  Drink enough fluid to keep your urine pale yellow.  Take over-the-counter or prescription medicines.  Eat foods that are high in fiber, such as beans, whole grains, and fresh fruits and vegetables.  Limit foods that are high in fat and processed sugars, such as fried or sweet foods. Lifestyle  Do not use any products that contain nicotine or tobacco, such as cigarettes, e-cigarettes, and chewing tobacco. If you need help quitting, ask your health care provider.  Eat a healthy diet that includes foods such as vegetables, fruits, fish, and lean meats.  Work with your health care provider to achieve or maintain a healthy weight. General instructions  Get regular exercise as told. Exercise improves flexibility and strength.  If physical therapy was prescribed, do exercises as told by your health care  provider.  Use ice or heat therapy as told by your health care provider.  Keep all follow-up visits as told by your health care provider. This is important. Where can I get support? Consider joining a support group for people managing chronic back pain. Ask your health care provider about support groups in your area. You can also find online and in-person support groups through:  The American Chronic Pain Association: theacpa.org  Pain Connection Program: painconnection.org Contact a health care provider if:  You have pain that is not relieved with rest or medicine.  Your pain gets worse, or you have new pain.  You have a fever.  You have rapid weight loss.  You have trouble doing your normal activities. Get help right away if:  You have weakness or numbness in one or both of your legs or feet.  You have trouble controlling your bladder or your bowels.  You have severe back pain and have any of the following: ? Nausea or vomiting. ? Abdominal pain. ? Shortness of breath or you faint. Summary  Chronic back  pain is often treated with rest, pain relief, and physical therapy.  Talk therapy, acupuncture, massage, and local electrical stimulation may help.  Follow your treatment plan as told by your health care provider.  Joining a support group may help you manage chronic back pain. This information is not intended to replace advice given to you by your health care provider. Make sure you discuss any questions you have with your health care provider. Document Revised: 06/01/2019 Document Reviewed: 02/07/2019 Elsevier Patient Education  [place].

## 2020-08-12 NOTE — Progress Notes (Signed)
   Subjective:    Patient ID: Karla Maxwell, female    DOB: 03-02-1951, 70 y.o.   MRN: [id number]   Chief Complaint: Leg Pain (Left leg. Seen last week and no better/)   HPI Patient was seen on 08/05/20 with back pain radiating down left leg. She was given steroids and they did not help her pain at all. She rates pain 8/10 today. nothing makes pain better. Walking increases pain.   Review of Systems  Musculoskeletal: Positive for back pain.  Psychiatric/Behavioral: Negative.   All other systems reviewed and are negative.      Objective:   Physical Exam Vitals and nursing note reviewed.  Constitutional:      Appearance: Normal appearance.  Cardiovascular:     Rate and Rhythm: Normal rate and regular rhythm.     Heart sounds: Normal heart sounds.  Pulmonary:     Breath sounds: Normal breath sounds.  Musculoskeletal:     Comments: FROM of lumbar spine with pain on flexion (-) SLR on right (+) SLR On left at 45 degrees Motor strength and sensation distally intact.  Skin:    General: Skin is warm.  Neurological:     General: No focal deficit present.     Mental Status: She is alert and oriented to person, place, and time.  Psychiatric:        Mood and Affect: Mood normal.        Behavior: Behavior normal.    BP (!) 120/57   Pulse 81   Temp 97.7 F (36.5 C) (Temporal)   Resp 20   Ht 5\' 1"  (1.549 m)   Wt 147 lb (66.7 kg)   SpO2 95%   BMI 27.78 kg/m         Assessment & Plan:  Karla Maxwell in today with chief complaint of Leg Pain (Left leg. Seen last week and no better/)   1. Back pain with sciatica Moist heat rest - ketorolac (TORADOL) injection 60 mg - Ambulatory referral to Orthopedic Surgery - HYDROcodone-acetaminophen (NORCO/VICODIN) 5-325 MG tablet; Take 1 tablet by mouth every 6 (six) hours as needed for moderate pain.  Dispense: 30 tablet; Refill: 0    The above assessment and management plan was discussed with the patient. The patient  verbalized understanding of and has agreed to the management plan. Patient is aware to call the clinic if symptoms persist or worsen. Patient is aware when to return to the clinic for a follow-up visit. Patient educated on when it is appropriate to go to the emergency department.   Mary-Margaret Hassell Done, FNP

## 2020-08-15 ENCOUNTER — Other Ambulatory Visit: Payer: Self-pay | Admitting: Nurse Practitioner

## 2020-08-15 DIAGNOSIS — E039 Hypothyroidism, unspecified: Secondary | ICD-10-CM

## 2020-08-15 DIAGNOSIS — K219 Gastro-esophageal reflux disease without esophagitis: Secondary | ICD-10-CM

## 2020-08-15 DIAGNOSIS — E782 Mixed hyperlipidemia: Secondary | ICD-10-CM

## 2020-08-15 DIAGNOSIS — M5442 Lumbago with sciatica, left side: Secondary | ICD-10-CM

## 2020-08-15 DIAGNOSIS — G8929 Other chronic pain: Secondary | ICD-10-CM

## 2020-08-15 DIAGNOSIS — J449 Chronic obstructive pulmonary disease, unspecified: Secondary | ICD-10-CM

## 2020-08-15 NOTE — Telephone Encounter (Signed)
Office visit 08/12/20  Last refill 05/16/20, #90, 1 refill

## 2020-08-16 DIAGNOSIS — J449 Chronic obstructive pulmonary disease, unspecified: Secondary | ICD-10-CM | POA: Diagnosis not present

## 2020-08-21 DIAGNOSIS — M5137 Other intervertebral disc degeneration, lumbosacral region: Secondary | ICD-10-CM | POA: Diagnosis not present

## 2020-08-21 DIAGNOSIS — M9901 Segmental and somatic dysfunction of cervical region: Secondary | ICD-10-CM | POA: Diagnosis not present

## 2020-08-21 DIAGNOSIS — M9903 Segmental and somatic dysfunction of lumbar region: Secondary | ICD-10-CM | POA: Diagnosis not present

## 2020-08-21 DIAGNOSIS — M9902 Segmental and somatic dysfunction of thoracic region: Secondary | ICD-10-CM | POA: Diagnosis not present

## 2020-08-22 DIAGNOSIS — M9903 Segmental and somatic dysfunction of lumbar region: Secondary | ICD-10-CM | POA: Diagnosis not present

## 2020-08-22 DIAGNOSIS — M9902 Segmental and somatic dysfunction of thoracic region: Secondary | ICD-10-CM | POA: Diagnosis not present

## 2020-08-22 DIAGNOSIS — M9901 Segmental and somatic dysfunction of cervical region: Secondary | ICD-10-CM | POA: Diagnosis not present

## 2020-08-22 DIAGNOSIS — M5137 Other intervertebral disc degeneration, lumbosacral region: Secondary | ICD-10-CM | POA: Diagnosis not present

## 2020-08-26 DIAGNOSIS — M5137 Other intervertebral disc degeneration, lumbosacral region: Secondary | ICD-10-CM | POA: Diagnosis not present

## 2020-08-26 DIAGNOSIS — M9903 Segmental and somatic dysfunction of lumbar region: Secondary | ICD-10-CM | POA: Diagnosis not present

## 2020-08-26 DIAGNOSIS — M9901 Segmental and somatic dysfunction of cervical region: Secondary | ICD-10-CM | POA: Diagnosis not present

## 2020-08-26 DIAGNOSIS — M9902 Segmental and somatic dysfunction of thoracic region: Secondary | ICD-10-CM | POA: Diagnosis not present

## 2020-08-27 DIAGNOSIS — M9903 Segmental and somatic dysfunction of lumbar region: Secondary | ICD-10-CM | POA: Diagnosis not present

## 2020-08-27 DIAGNOSIS — M5137 Other intervertebral disc degeneration, lumbosacral region: Secondary | ICD-10-CM | POA: Diagnosis not present

## 2020-08-27 DIAGNOSIS — M9902 Segmental and somatic dysfunction of thoracic region: Secondary | ICD-10-CM | POA: Diagnosis not present

## 2020-08-27 DIAGNOSIS — M9901 Segmental and somatic dysfunction of cervical region: Secondary | ICD-10-CM | POA: Diagnosis not present

## 2020-08-29 DIAGNOSIS — M9903 Segmental and somatic dysfunction of lumbar region: Secondary | ICD-10-CM | POA: Diagnosis not present

## 2020-08-29 DIAGNOSIS — M9902 Segmental and somatic dysfunction of thoracic region: Secondary | ICD-10-CM | POA: Diagnosis not present

## 2020-08-29 DIAGNOSIS — M5137 Other intervertebral disc degeneration, lumbosacral region: Secondary | ICD-10-CM | POA: Diagnosis not present

## 2020-08-29 DIAGNOSIS — M9901 Segmental and somatic dysfunction of cervical region: Secondary | ICD-10-CM | POA: Diagnosis not present

## 2020-09-02 DIAGNOSIS — M9902 Segmental and somatic dysfunction of thoracic region: Secondary | ICD-10-CM | POA: Diagnosis not present

## 2020-09-02 DIAGNOSIS — M9903 Segmental and somatic dysfunction of lumbar region: Secondary | ICD-10-CM | POA: Diagnosis not present

## 2020-09-02 DIAGNOSIS — M9901 Segmental and somatic dysfunction of cervical region: Secondary | ICD-10-CM | POA: Diagnosis not present

## 2020-09-02 DIAGNOSIS — M5137 Other intervertebral disc degeneration, lumbosacral region: Secondary | ICD-10-CM | POA: Diagnosis not present

## 2020-09-03 DIAGNOSIS — M9902 Segmental and somatic dysfunction of thoracic region: Secondary | ICD-10-CM | POA: Diagnosis not present

## 2020-09-03 DIAGNOSIS — M9901 Segmental and somatic dysfunction of cervical region: Secondary | ICD-10-CM | POA: Diagnosis not present

## 2020-09-03 DIAGNOSIS — M9903 Segmental and somatic dysfunction of lumbar region: Secondary | ICD-10-CM | POA: Diagnosis not present

## 2020-09-03 DIAGNOSIS — M5137 Other intervertebral disc degeneration, lumbosacral region: Secondary | ICD-10-CM | POA: Diagnosis not present

## 2020-09-05 DIAGNOSIS — M9902 Segmental and somatic dysfunction of thoracic region: Secondary | ICD-10-CM | POA: Diagnosis not present

## 2020-09-05 DIAGNOSIS — M5137 Other intervertebral disc degeneration, lumbosacral region: Secondary | ICD-10-CM | POA: Diagnosis not present

## 2020-09-05 DIAGNOSIS — M9903 Segmental and somatic dysfunction of lumbar region: Secondary | ICD-10-CM | POA: Diagnosis not present

## 2020-09-05 DIAGNOSIS — M9901 Segmental and somatic dysfunction of cervical region: Secondary | ICD-10-CM | POA: Diagnosis not present

## 2020-09-09 ENCOUNTER — Ambulatory Visit (INDEPENDENT_AMBULATORY_CARE_PROVIDER_SITE_OTHER): Payer: Medicare Other

## 2020-09-09 VITALS — Ht 61.0 in | Wt 142.0 lb

## 2020-09-09 DIAGNOSIS — Z0000 Encounter for general adult medical examination without abnormal findings: Secondary | ICD-10-CM

## 2020-09-09 DIAGNOSIS — M9901 Segmental and somatic dysfunction of cervical region: Secondary | ICD-10-CM | POA: Diagnosis not present

## 2020-09-09 DIAGNOSIS — M5137 Other intervertebral disc degeneration, lumbosacral region: Secondary | ICD-10-CM | POA: Diagnosis not present

## 2020-09-09 DIAGNOSIS — M9903 Segmental and somatic dysfunction of lumbar region: Secondary | ICD-10-CM | POA: Diagnosis not present

## 2020-09-09 DIAGNOSIS — M9902 Segmental and somatic dysfunction of thoracic region: Secondary | ICD-10-CM | POA: Diagnosis not present

## 2020-09-09 NOTE — Patient Instructions (Signed)
[name] , Thank you for taking time to come for your Medicare Wellness Visit. I appreciate your ongoing commitment to your health goals. Please review the following plan we discussed and let me know if I can assist you in the future.   Screening recommendations/referrals: Colonoscopy: Has appointment 09/23/20 Mammogram: Done 12/27/2019 - Repeat annually Bone Density: Done 12/27/2019 - Repeat every 2-5 years Recommended yearly ophthalmology/optometry visit for glaucoma screening and checkup Recommended yearly dental visit for hygiene and checkup  Vaccinations: Influenza vaccine: Declined Pneumococcal vaccine: Done 02/24/2016 & 02/25/2017 Tdap vaccine: Done 02/14/2009 - DUE for repeat (every 10 years) Shingles vaccine: Zostavax done 05/14/2015 - DUE for Shingrix discussed. Please contact your pharmacy for coverage information.    Covid-19: J&J done 08/08/19 - Due for booster  Advanced directives: Advance directive discussed with you today. I have provided a copy for you to complete at home and have notarized. Once this is complete please bring a copy in to our office so we can scan it into your chart.  Conditions/risks identified: Aim for 30 minutes of exercise or brisk walking each day, drink 6-8 glasses of water and eat lots of fruits and vegetables.  Next appointment: Follow up in one year for your annual wellness visit    Preventive Care 65 Years and Older, Female Preventive care refers to lifestyle choices and visits with your health care provider that can promote health and wellness. What does preventive care include?  A yearly physical exam. This is also called an annual well check.  Dental exams once or twice a year.  Routine eye exams. Ask your health care provider how often you should have your eyes checked.  Personal lifestyle choices, including:  Daily care of your teeth and gums.  Regular physical activity.  Eating a healthy diet.  Avoiding tobacco and drug  use.  Limiting alcohol use.  Practicing safe sex.  Taking low-dose aspirin every day.  Taking vitamin and mineral supplements as recommended by your health care provider. What happens during an annual well check? The services and screenings done by your health care provider during your annual well check will depend on your age, overall health, lifestyle risk factors, and family history of disease. Counseling  Your health care provider may ask you questions about your:  Alcohol use.  Tobacco use.  Drug use.  Emotional well-being.  Home and relationship well-being.  Sexual activity.  Eating habits.  History of falls.  Memory and ability to understand (cognition).  Work and work Statistician.  Reproductive health. Screening  You may have the following tests or measurements:  Height, weight, and BMI.  Blood pressure.  Lipid and cholesterol levels. These may be checked every 5 years, or more frequently if you are over 66 years old.  Skin check.  Lung cancer screening. You may have this screening every year starting at age 10 if you have a 30-pack-year history of smoking and currently smoke or have quit within the past 15 years.  Fecal occult blood test (FOBT) of the stool. You may have this test every year starting at age 23.  Flexible sigmoidoscopy or colonoscopy. You may have a sigmoidoscopy every 5 years or a colonoscopy every 10 years starting at age 29.  Hepatitis C blood test.  Hepatitis B blood test.  Sexually transmitted disease (STD) testing.  Diabetes screening. This is done by checking your blood sugar (glucose) after you have not eaten for a while (fasting). You may have this done every 1-3 years.  Bone density scan. This is done to screen for osteoporosis. You may have this done starting at age 32.  Mammogram. This may be done every 1-2 years. Talk to your health care provider about how often you should have regular mammograms. Talk with your  health care provider about your test results, treatment options, and if necessary, the need for more tests. Vaccines  Your health care provider may recommend certain vaccines, such as:  Influenza vaccine. This is recommended every year.  Tetanus, diphtheria, and acellular pertussis (Tdap, Td) vaccine. You may need a Td booster every 10 years.  Zoster vaccine. You may need this after age 101.  Pneumococcal 13-valent conjugate (PCV13) vaccine. One dose is recommended after age 31.  Pneumococcal polysaccharide (PPSV23) vaccine. One dose is recommended after age 73. Talk to your health care provider about which screenings and vaccines you need and how often you need them. This information is not intended to replace advice given to you by your health care provider. Make sure you discuss any questions you have with your health care provider. Document Released: 05/17/2015 Document Revised: 01/08/2016 Document Reviewed: 02/19/2015 Elsevier Interactive Patient Education  2017 [place] Prevention in the Home Falls can cause injuries. They can happen to people of all ages. There are many things you can do to make your home safe and to help prevent falls. What can I do on the outside of my home?  Regularly fix the edges of walkways and driveways and fix any cracks.  Remove anything that might make you trip as you walk through a door, such as a raised step or threshold.  Trim any bushes or trees on the path to your home.  Use bright outdoor lighting.  Clear any walking paths of anything that might make someone trip, such as rocks or tools.  Regularly check to see if handrails are loose or broken. Make sure that both sides of any steps have handrails.  Any raised decks and porches should have guardrails on the edges.  Have any leaves, snow, or ice cleared regularly.  Use sand or salt on walking paths during winter.  Clean up any spills in your garage right away. This includes oil  or grease spills. What can I do in the bathroom?  Use night lights.  Install grab bars by the toilet and in the tub and shower. Do not use towel bars as grab bars.  Use non-skid mats or decals in the tub or shower.  If you need to sit down in the shower, use a plastic, non-slip stool.  Keep the floor dry. Clean up any water that spills on the floor as soon as it happens.  Remove soap buildup in the tub or shower regularly.  Attach bath mats securely with double-sided non-slip rug tape.  Do not have throw rugs and other things on the floor that can make you trip. What can I do in the bedroom?  Use night lights.  Make sure that you have a light by your bed that is easy to reach.  Do not use any sheets or blankets that are too big for your bed. They should not hang down onto the floor.  Have a firm chair that has side arms. You can use this for support while you get dressed.  Do not have throw rugs and other things on the floor that can make you trip. What can I do in the kitchen?  Clean up any spills right away.  Avoid walking  on wet floors.  Keep items that you use a lot in easy-to-reach places.  If you need to reach something above you, use a strong step stool that has a grab bar.  Keep electrical cords out of the way.  Do not use floor polish or wax that makes floors slippery. If you must use wax, use non-skid floor wax.  Do not have throw rugs and other things on the floor that can make you trip. What can I do with my stairs?  Do not leave any items on the stairs.  Make sure that there are handrails on both sides of the stairs and use them. Fix handrails that are broken or loose. Make sure that handrails are as long as the stairways.  Check any carpeting to make sure that it is firmly attached to the stairs. Fix any carpet that is loose or worn.  Avoid having throw rugs at the top or bottom of the stairs. If you do have throw rugs, attach them to the floor with  carpet tape.  Make sure that you have a light switch at the top of the stairs and the bottom of the stairs. If you do not have them, ask someone to add them for you. What else can I do to help prevent falls?  Wear shoes that:  Do not have high heels.  Have rubber bottoms.  Are comfortable and fit you well.  Are closed at the toe. Do not wear sandals.  If you use a stepladder:  Make sure that it is fully opened. Do not climb a closed stepladder.  Make sure that both sides of the stepladder are locked into place.  Ask someone to hold it for you, if possible.  Clearly mark and make sure that you can see:  Any grab bars or handrails.  First and last steps.  Where the edge of each step is.  Use tools that help you move around (mobility aids) if they are needed. These include:  Canes.  Walkers.  Scooters.  Crutches.  Turn on the lights when you go into a dark area. Replace any light bulbs as soon as they burn out.  Set up your furniture so you have a clear path. Avoid moving your furniture around.  If any of your floors are uneven, fix them.  If there are any pets around you, be aware of where they are.  Review your medicines with your doctor. Some medicines can make you feel dizzy. This can increase your chance of falling. Ask your doctor what other things that you can do to help prevent falls. This information is not intended to replace advice given to you by your health care provider. Make sure you discuss any questions you have with your health care provider. Document Released: 02/14/2009 Document Revised: 09/26/2015 Document Reviewed: 05/25/2014 Elsevier Interactive Patient Education  2017 [employer].
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
## 2020-09-10 DIAGNOSIS — M9903 Segmental and somatic dysfunction of lumbar region: Secondary | ICD-10-CM | POA: Diagnosis not present

## 2020-09-10 DIAGNOSIS — M9902 Segmental and somatic dysfunction of thoracic region: Secondary | ICD-10-CM | POA: Diagnosis not present

## 2020-09-10 DIAGNOSIS — M5137 Other intervertebral disc degeneration, lumbosacral region: Secondary | ICD-10-CM | POA: Diagnosis not present

## 2020-09-10 DIAGNOSIS — M9901 Segmental and somatic dysfunction of cervical region: Secondary | ICD-10-CM | POA: Diagnosis not present

## 2020-09-12 DIAGNOSIS — M9901 Segmental and somatic dysfunction of cervical region: Secondary | ICD-10-CM | POA: Diagnosis not present

## 2020-09-12 DIAGNOSIS — M5137 Other intervertebral disc degeneration, lumbosacral region: Secondary | ICD-10-CM | POA: Diagnosis not present

## 2020-09-12 DIAGNOSIS — M9903 Segmental and somatic dysfunction of lumbar region: Secondary | ICD-10-CM | POA: Diagnosis not present

## 2020-09-12 DIAGNOSIS — M9902 Segmental and somatic dysfunction of thoracic region: Secondary | ICD-10-CM | POA: Diagnosis not present

## 2020-09-15 DIAGNOSIS — J449 Chronic obstructive pulmonary disease, unspecified: Secondary | ICD-10-CM | POA: Diagnosis not present

## 2020-09-16 DIAGNOSIS — M5137 Other intervertebral disc degeneration, lumbosacral region: Secondary | ICD-10-CM | POA: Diagnosis not present

## 2020-09-16 DIAGNOSIS — M9903 Segmental and somatic dysfunction of lumbar region: Secondary | ICD-10-CM | POA: Diagnosis not present

## 2020-09-16 DIAGNOSIS — M9901 Segmental and somatic dysfunction of cervical region: Secondary | ICD-10-CM | POA: Diagnosis not present

## 2020-09-16 DIAGNOSIS — M9902 Segmental and somatic dysfunction of thoracic region: Secondary | ICD-10-CM | POA: Diagnosis not present

## 2020-09-17 DIAGNOSIS — M5137 Other intervertebral disc degeneration, lumbosacral region: Secondary | ICD-10-CM | POA: Diagnosis not present

## 2020-09-17 DIAGNOSIS — M9902 Segmental and somatic dysfunction of thoracic region: Secondary | ICD-10-CM | POA: Diagnosis not present

## 2020-09-17 DIAGNOSIS — M9901 Segmental and somatic dysfunction of cervical region: Secondary | ICD-10-CM | POA: Diagnosis not present

## 2020-09-17 DIAGNOSIS — M9903 Segmental and somatic dysfunction of lumbar region: Secondary | ICD-10-CM | POA: Diagnosis not present

## 2020-09-19 DIAGNOSIS — M5137 Other intervertebral disc degeneration, lumbosacral region: Secondary | ICD-10-CM | POA: Diagnosis not present

## 2020-09-19 DIAGNOSIS — M9901 Segmental and somatic dysfunction of cervical region: Secondary | ICD-10-CM | POA: Diagnosis not present

## 2020-09-19 DIAGNOSIS — M9903 Segmental and somatic dysfunction of lumbar region: Secondary | ICD-10-CM | POA: Diagnosis not present

## 2020-09-19 DIAGNOSIS — M9902 Segmental and somatic dysfunction of thoracic region: Secondary | ICD-10-CM | POA: Diagnosis not present

## 2020-09-19 DIAGNOSIS — H10013 Acute follicular conjunctivitis, bilateral: Secondary | ICD-10-CM | POA: Diagnosis not present

## 2020-09-23 ENCOUNTER — Telehealth (INDEPENDENT_AMBULATORY_CARE_PROVIDER_SITE_OTHER): Payer: Medicare Other | Admitting: Gastroenterology

## 2020-09-23 ENCOUNTER — Other Ambulatory Visit: Payer: Self-pay

## 2020-09-23 ENCOUNTER — Ambulatory Visit (INDEPENDENT_AMBULATORY_CARE_PROVIDER_SITE_OTHER): Payer: Medicare Other | Admitting: Gastroenterology

## 2020-09-23 ENCOUNTER — Encounter (INDEPENDENT_AMBULATORY_CARE_PROVIDER_SITE_OTHER): Payer: Self-pay | Admitting: Gastroenterology

## 2020-09-23 DIAGNOSIS — R197 Diarrhea, unspecified: Secondary | ICD-10-CM | POA: Insufficient documentation

## 2020-09-23 DIAGNOSIS — K625 Hemorrhage of anus and rectum: Secondary | ICD-10-CM | POA: Insufficient documentation

## 2020-09-23 DIAGNOSIS — K529 Noninfective gastroenteritis and colitis, unspecified: Secondary | ICD-10-CM | POA: Diagnosis not present

## 2020-09-23 DIAGNOSIS — D509 Iron deficiency anemia, unspecified: Secondary | ICD-10-CM | POA: Diagnosis not present
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
## 2020-09-23 NOTE — Patient Instructions (Addendum)
Continue oral iron daily Perform blood workup Schedule EGD and colonoscopy

## 2020-09-24 DIAGNOSIS — M9902 Segmental and somatic dysfunction of thoracic region: Secondary | ICD-10-CM | POA: Diagnosis not present

## 2020-09-24 DIAGNOSIS — M9903 Segmental and somatic dysfunction of lumbar region: Secondary | ICD-10-CM | POA: Diagnosis not present

## 2020-09-24 DIAGNOSIS — M5137 Other intervertebral disc degeneration, lumbosacral region: Secondary | ICD-10-CM | POA: Diagnosis not present

## 2020-09-24 DIAGNOSIS — M9901 Segmental and somatic dysfunction of cervical region: Secondary | ICD-10-CM | POA: Diagnosis not present

## 2020-09-25 ENCOUNTER — Encounter (INDEPENDENT_AMBULATORY_CARE_PROVIDER_SITE_OTHER): Payer: Self-pay

## 2020-09-25 ENCOUNTER — Telehealth (INDEPENDENT_AMBULATORY_CARE_PROVIDER_SITE_OTHER): Payer: Self-pay

## 2020-09-25 ENCOUNTER — Other Ambulatory Visit (INDEPENDENT_AMBULATORY_CARE_PROVIDER_SITE_OTHER): Payer: Self-pay

## 2020-09-25 MED ORDER — NA SULFATE-K SULFATE-MG SULF 17.5-3.13-1.6 GM/177ML PO SOLN: 1.0000 | Freq: Once | ORAL | 0 refills | Status: AC

## 2020-09-25 MED ORDER — MAGNESIUM CITRATE PO SOLN: 1.0000 | Freq: Once | ORAL | 0 refills | Status: AC

## 2020-09-25 NOTE — Telephone Encounter (Signed)
Karla Maxwell, CMA  

## 2020-09-25 NOTE — Telephone Encounter (Signed)
LeighAnn Josselyne Onofrio, CMA  

## 2020-09-26 DIAGNOSIS — M9902 Segmental and somatic dysfunction of thoracic region: Secondary | ICD-10-CM | POA: Diagnosis not present

## 2020-09-26 DIAGNOSIS — M5137 Other intervertebral disc degeneration, lumbosacral region: Secondary | ICD-10-CM | POA: Diagnosis not present

## 2020-09-26 DIAGNOSIS — M9903 Segmental and somatic dysfunction of lumbar region: Secondary | ICD-10-CM | POA: Diagnosis not present

## 2020-09-26 DIAGNOSIS — M9901 Segmental and somatic dysfunction of cervical region: Secondary | ICD-10-CM | POA: Diagnosis not present

## 2020-10-04 ENCOUNTER — Other Ambulatory Visit: Payer: Self-pay | Admitting: Nurse Practitioner

## 2020-10-08 ENCOUNTER — Ambulatory Visit (HOSPITAL_COMMUNITY)
Admission: RE | Admit: 2020-10-08 | Discharge: 2020-10-08 | Disposition: A | Discharge status: Home / Self Care | Payer: Medicare Other | Source: Ambulatory Visit | Attending: Hematology | Admitting: Hematology

## 2020-10-08 ENCOUNTER — Inpatient Hospital Stay (HOSPITAL_COMMUNITY): Payer: Medicare Other | Attending: Hematology

## 2020-10-08 ENCOUNTER — Other Ambulatory Visit: Payer: Self-pay

## 2020-10-08 DIAGNOSIS — R5383 Other fatigue: Secondary | ICD-10-CM | POA: Insufficient documentation

## 2020-10-08 DIAGNOSIS — Z803 Family history of malignant neoplasm of breast: Secondary | ICD-10-CM | POA: Diagnosis not present

## 2020-10-08 DIAGNOSIS — Z7982 Long term (current) use of aspirin: Secondary | ICD-10-CM | POA: Insufficient documentation

## 2020-10-08 DIAGNOSIS — D75839 Thrombocytosis, unspecified: Secondary | ICD-10-CM | POA: Insufficient documentation

## 2020-10-08 DIAGNOSIS — Z800 Family history of malignant neoplasm of digestive organs: Secondary | ICD-10-CM | POA: Diagnosis not present

## 2020-10-08 DIAGNOSIS — F17210 Nicotine dependence, cigarettes, uncomplicated: Secondary | ICD-10-CM | POA: Diagnosis not present

## 2020-10-08 DIAGNOSIS — M791 Myalgia, unspecified site: Secondary | ICD-10-CM | POA: Insufficient documentation

## 2020-10-08 DIAGNOSIS — R634 Abnormal weight loss: Secondary | ICD-10-CM | POA: Insufficient documentation

## 2020-10-08 LAB — CBC WITH DIFFERENTIAL/PLATELET
Abs Immature Granulocytes: 0.04 10*3/uL (ref 0.00–0.07)
Basophils Absolute: 0.1 10*3/uL (ref 0.0–0.1)
Basophils Relative: 1 %
Eosinophils Absolute: 0.2 10*3/uL (ref 0.0–0.5)
Eosinophils Relative: 2 %
HCT: 40.3 % (ref 36.0–46.0)
Hemoglobin: 13.1 g/dL (ref 12.0–15.0)
Immature Granulocytes: 0 %
Lymphocytes Relative: 31 %
Lymphs Abs: 3.3 10*3/uL (ref 0.7–4.0)
MCH: 29.9 pg (ref 26.0–34.0)
MCHC: 32.5 g/dL (ref 30.0–36.0)
MCV: 92 fL (ref 80.0–100.0)
Monocytes Absolute: 0.6 10*3/uL (ref 0.1–1.0)
Monocytes Relative: 6 %
Neutro Abs: 6.4 10*3/uL (ref 1.7–7.7)
Neutrophils Relative %: 60 %
Platelets: 567 10*3/uL — ABNORMAL HIGH (ref 150–400)
RBC: 4.38 MIL/uL (ref 3.87–5.11)
RDW: 15.2 % (ref 11.5–15.5)
WBC: 10.6 10*3/uL — ABNORMAL HIGH (ref 4.0–10.5)
nRBC: 0 % (ref 0.0–0.2)

## 2020-10-08 LAB — FERRITIN: Ferritin: 32 ng/mL (ref 11–307)

## 2020-10-08 LAB — IRON AND TIBC
Iron: 61 ug/dL (ref 28–170)
Saturation Ratios: 13 % (ref 10.4–31.8)
TIBC: 458 ug/dL — ABNORMAL HIGH (ref 250–450)
UIBC: 397 ug/dL

## 2020-10-08 LAB — SEDIMENTATION RATE: Sed Rate: 7 mm/hr (ref 0–22)

## 2020-10-10 DIAGNOSIS — M9903 Segmental and somatic dysfunction of lumbar region: Secondary | ICD-10-CM | POA: Diagnosis not present

## 2020-10-10 DIAGNOSIS — M9901 Segmental and somatic dysfunction of cervical region: Secondary | ICD-10-CM | POA: Diagnosis not present

## 2020-10-10 DIAGNOSIS — M9902 Segmental and somatic dysfunction of thoracic region: Secondary | ICD-10-CM | POA: Diagnosis not present

## 2020-10-10 DIAGNOSIS — M5137 Other intervertebral disc degeneration, lumbosacral region: Secondary | ICD-10-CM | POA: Diagnosis not present
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
## 2020-10-15 ENCOUNTER — Inpatient Hospital Stay (HOSPITAL_COMMUNITY): Payer: Medicare Other | Admitting: Hematology

## 2020-10-15 ENCOUNTER — Other Ambulatory Visit: Payer: Self-pay

## 2020-10-15 VITALS — BP 128/55 | HR 82 | Temp 96.9°F | Resp 16 | Wt 142.0 lb

## 2020-10-15 DIAGNOSIS — Z7982 Long term (current) use of aspirin: Secondary | ICD-10-CM | POA: Diagnosis not present

## 2020-10-15 DIAGNOSIS — M791 Myalgia, unspecified site: Secondary | ICD-10-CM | POA: Diagnosis not present

## 2020-10-15 DIAGNOSIS — D75839 Thrombocytosis, unspecified: Secondary | ICD-10-CM | POA: Diagnosis not present

## 2020-10-15 DIAGNOSIS — Z800 Family history of malignant neoplasm of digestive organs: Secondary | ICD-10-CM | POA: Diagnosis not present

## 2020-10-15 DIAGNOSIS — R634 Abnormal weight loss: Secondary | ICD-10-CM | POA: Diagnosis not present

## 2020-10-15 DIAGNOSIS — F17210 Nicotine dependence, cigarettes, uncomplicated: Secondary | ICD-10-CM | POA: Diagnosis not present

## 2020-10-15 DIAGNOSIS — Z803 Family history of malignant neoplasm of breast: Secondary | ICD-10-CM | POA: Diagnosis not present

## 2020-10-15 DIAGNOSIS — R5383 Other fatigue: Secondary | ICD-10-CM | POA: Diagnosis not present

## 2020-10-15 NOTE — Patient Instructions (Signed)
[facility] at [facility] °Discharge Instructions ° °You were seen today by Dr. Katragadda. He went over your recent results. Dr. Katragadda will see you back in 4 months for labs and follow up. ° ° °Thank you for choosing [facility] at [facility] to provide your oncology and hematology care.  To afford each patient quality time with our provider, please arrive at least 15 minutes before your scheduled appointment time.  ° °If you have a lab appointment with the Cancer Center please come in thru the Main Entrance and check in at the main information desk ° °You need to re-schedule your appointment should you arrive 10 or more minutes late.  We strive to give you quality time with our providers, and arriving late affects you and other patients whose appointments are after yours.  Also, if you no show three or more times for appointments you may be dismissed from the clinic at the providers discretion.     °Again, thank you for choosing [facility].  Our hope is that these requests will decrease the amount of time that you wait before being seen by our physicians.       °_____________________________________________________________ ° °Should you have questions after your visit to [facility], please contact our office at [phone] between the hours of 8:00 a.m. and 4:30 p.m.  Voicemails left after 4:00 p.m. will not be returned until the following business day.  For prescription refill requests, have your pharmacy contact our office and allow 72 hours.   ° °Cancer Center Support Programs:  ° °> Cancer Support Group  °2nd Tuesday of the month 1pm-2pm, Journey Room  ° ° °

## 2020-10-16 DIAGNOSIS — J449 Chronic obstructive pulmonary disease, unspecified: Secondary | ICD-10-CM | POA: Diagnosis not present

## 2020-10-24 DIAGNOSIS — M9902 Segmental and somatic dysfunction of thoracic region: Secondary | ICD-10-CM | POA: Diagnosis not present

## 2020-10-24 DIAGNOSIS — M9903 Segmental and somatic dysfunction of lumbar region: Secondary | ICD-10-CM | POA: Diagnosis not present

## 2020-10-24 DIAGNOSIS — M9901 Segmental and somatic dysfunction of cervical region: Secondary | ICD-10-CM | POA: Diagnosis not present

## 2020-10-24 DIAGNOSIS — M5137 Other intervertebral disc degeneration, lumbosacral region: Secondary | ICD-10-CM | POA: Diagnosis not present

## 2020-10-31 ENCOUNTER — Encounter (HOSPITAL_COMMUNITY): Payer: Self-pay | Admitting: Gastroenterology

## 2020-11-01 ENCOUNTER — Other Ambulatory Visit (HOSPITAL_COMMUNITY): Payer: Medicare Other

## 2020-11-01 ENCOUNTER — Other Ambulatory Visit: Payer: Self-pay

## 2020-11-01 ENCOUNTER — Other Ambulatory Visit (HOSPITAL_COMMUNITY)
Admission: RE | Admit: 2020-11-01 | Discharge: 2020-11-01 | Disposition: A | Discharge status: Home / Self Care | Payer: Medicare Other | Source: Ambulatory Visit | Attending: Gastroenterology | Admitting: Gastroenterology

## 2020-11-01 DIAGNOSIS — K529 Noninfective gastroenteritis and colitis, unspecified: Secondary | ICD-10-CM | POA: Diagnosis not present

## 2020-11-01 LAB — COMPREHENSIVE METABOLIC PANEL
ALT: 13 U/L (ref 0–44)
AST: 14 U/L — ABNORMAL LOW (ref 15–41)
Albumin: 4 g/dL (ref 3.5–5.0)
Alkaline Phosphatase: 51 U/L (ref 38–126)
Anion gap: 9 (ref 5–15)
BUN: 18 mg/dL (ref 8–23)
CO2: 22 mmol/L (ref 22–32)
Calcium: 9.1 mg/dL (ref 8.9–10.3)
Chloride: 103 mmol/L (ref 98–111)
Creatinine, Ser: 0.73 mg/dL (ref 0.44–1.00)
GFR, Estimated: >60 mL/min (ref >60)
Glucose, Bld: 107 mg/dL — ABNORMAL HIGH (ref 70–99)
Potassium: 3.9 mmol/L (ref 3.5–5.1)
Sodium: 134 mmol/L — ABNORMAL LOW (ref 135–145)
Total Bilirubin: 0.4 mg/dL (ref 0.3–1.2)
Total Protein: 6.7 g/dL (ref 6.5–8.1)

## 2020-11-01 LAB — CBC WITH DIFFERENTIAL/PLATELET
Abs Immature Granulocytes: 0.04 10*3/uL (ref 0.00–0.07)
Basophils Absolute: 0.1 10*3/uL (ref 0.0–0.1)
Basophils Relative: 1 %
Eosinophils Absolute: 0.1 10*3/uL (ref 0.0–0.5)
Eosinophils Relative: 1 %
HCT: 41 % (ref 36.0–46.0)
Hemoglobin: 13.5 g/dL (ref 12.0–15.0)
Immature Granulocytes: 0 %
Lymphocytes Relative: 40 %
Lymphs Abs: 5.1 10*3/uL — ABNORMAL HIGH (ref 0.7–4.0)
MCH: 30.3 pg (ref 26.0–34.0)
MCHC: 32.9 g/dL (ref 30.0–36.0)
MCV: 92.1 fL (ref 80.0–100.0)
Monocytes Absolute: 0.7 10*3/uL (ref 0.1–1.0)
Monocytes Relative: 6 %
Neutro Abs: 6.7 10*3/uL (ref 1.7–7.7)
Neutrophils Relative %: 52 %
Platelets: 481 10*3/uL — ABNORMAL HIGH (ref 150–400)
RBC: 4.45 MIL/uL (ref 3.87–5.11)
RDW: 15.5 % (ref 11.5–15.5)
WBC Morphology: INCREASED
WBC: 12.5 10*3/uL — ABNORMAL HIGH (ref 4.0–10.5)
nRBC: 0 % (ref 0.0–0.2)

## 2020-11-05 ENCOUNTER — Encounter (HOSPITAL_COMMUNITY)
Admission: RE | Disposition: A | Discharge status: Home / Self Care | Payer: Self-pay | Source: Home / Self Care | Attending: Gastroenterology

## 2020-11-05 ENCOUNTER — Encounter (HOSPITAL_COMMUNITY): Payer: Self-pay | Admitting: Gastroenterology

## 2020-11-05 ENCOUNTER — Ambulatory Visit (HOSPITAL_COMMUNITY): Payer: Medicare Other | Admitting: Anesthesiology

## 2020-11-05 ENCOUNTER — Ambulatory Visit (HOSPITAL_COMMUNITY)
Admission: RE | Admit: 2020-11-05 | Discharge: 2020-11-05 | Disposition: A | Discharge status: Home / Self Care | Payer: Medicare Other | Attending: Gastroenterology | Admitting: Gastroenterology

## 2020-11-05 ENCOUNTER — Other Ambulatory Visit: Payer: Self-pay

## 2020-11-05 DIAGNOSIS — F17210 Nicotine dependence, cigarettes, uncomplicated: Secondary | ICD-10-CM | POA: Diagnosis not present

## 2020-11-05 DIAGNOSIS — J45909 Unspecified asthma, uncomplicated: Secondary | ICD-10-CM | POA: Diagnosis not present

## 2020-11-05 DIAGNOSIS — Z91018 Allergy to other foods: Secondary | ICD-10-CM | POA: Diagnosis not present

## 2020-11-05 DIAGNOSIS — K649 Unspecified hemorrhoids: Secondary | ICD-10-CM | POA: Diagnosis not present

## 2020-11-05 DIAGNOSIS — K5730 Diverticulosis of large intestine without perforation or abscess without bleeding: Secondary | ICD-10-CM | POA: Diagnosis not present

## 2020-11-05 DIAGNOSIS — R197 Diarrhea, unspecified: Secondary | ICD-10-CM | POA: Diagnosis not present

## 2020-11-05 DIAGNOSIS — K219 Gastro-esophageal reflux disease without esophagitis: Secondary | ICD-10-CM | POA: Insufficient documentation

## 2020-11-05 DIAGNOSIS — I10 Essential (primary) hypertension: Secondary | ICD-10-CM | POA: Insufficient documentation

## 2020-11-05 DIAGNOSIS — Z79899 Other long term (current) drug therapy: Secondary | ICD-10-CM | POA: Insufficient documentation

## 2020-11-05 DIAGNOSIS — Z7951 Long term (current) use of inhaled steroids: Secondary | ICD-10-CM | POA: Diagnosis not present

## 2020-11-05 DIAGNOSIS — K623 Rectal prolapse: Secondary | ICD-10-CM | POA: Insufficient documentation

## 2020-11-05 DIAGNOSIS — K449 Diaphragmatic hernia without obstruction or gangrene: Secondary | ICD-10-CM | POA: Diagnosis not present

## 2020-11-05 DIAGNOSIS — E039 Hypothyroidism, unspecified: Secondary | ICD-10-CM | POA: Diagnosis not present

## 2020-11-05 DIAGNOSIS — E785 Hyperlipidemia, unspecified: Secondary | ICD-10-CM | POA: Diagnosis not present

## 2020-11-05 DIAGNOSIS — D1779 Benign lipomatous neoplasm of other sites: Secondary | ICD-10-CM | POA: Insufficient documentation

## 2020-11-05 DIAGNOSIS — D509 Iron deficiency anemia, unspecified: Secondary | ICD-10-CM | POA: Diagnosis not present

## 2020-11-05 DIAGNOSIS — Z882 Allergy status to sulfonamides status: Secondary | ICD-10-CM | POA: Insufficient documentation

## 2020-11-05 DIAGNOSIS — J449 Chronic obstructive pulmonary disease, unspecified: Secondary | ICD-10-CM | POA: Diagnosis not present

## 2020-11-05 DIAGNOSIS — K529 Noninfective gastroenteritis and colitis, unspecified: Secondary | ICD-10-CM | POA: Diagnosis not present

## 2020-11-05 DIAGNOSIS — Z79890 Hormone replacement therapy (postmenopausal): Secondary | ICD-10-CM | POA: Insufficient documentation

## 2020-11-05 DIAGNOSIS — Z7982 Long term (current) use of aspirin: Secondary | ICD-10-CM | POA: Insufficient documentation

## 2020-11-05 DIAGNOSIS — K625 Hemorrhage of anus and rectum: Secondary | ICD-10-CM | POA: Diagnosis not present

## 2020-11-05 HISTORY — PX: COLONOSCOPY WITH PROPOFOL: SHX5780

## 2020-11-05 HISTORY — PX: BIOPSY: SHX5522

## 2020-11-05 HISTORY — PX: ESOPHAGOGASTRODUODENOSCOPY (EGD) WITH PROPOFOL: SHX5813

## 2020-11-05 LAB — GLUCOSE, CAPILLARY: Glucose-Capillary: 116 mg/dL — ABNORMAL HIGH (ref 70–99)

## 2020-11-05 SURGERY — COLONOSCOPY WITH PROPOFOL
Anesthesia: General

## 2020-11-05 MED ORDER — PROPOFOL 10 MG/ML IV BOLUS
INTRAVENOUS | Status: AC
  Filled 2020-11-05: qty 80

## 2020-11-05 MED ORDER — STERILE WATER FOR IRRIGATION IR SOLN
Status: DC | PRN
  Administered 2020-11-05: 200 mL

## 2020-11-05 MED ORDER — PHENYLEPHRINE HCL (PRESSORS) 10 MG/ML IV SOLN
INTRAVENOUS | Status: DC | PRN
  Administered 2020-11-05 (×3): 80 ug via INTRAVENOUS

## 2020-11-05 MED ORDER — PROPOFOL 500 MG/50ML IV EMUL
INTRAVENOUS | Status: DC | PRN
  Administered 2020-11-05: 150 ug/kg/min via INTRAVENOUS

## 2020-11-05 MED ORDER — LACTATED RINGERS IV SOLN: INTRAVENOUS | Status: DC

## 2020-11-05 MED ORDER — PROPOFOL 10 MG/ML IV BOLUS
INTRAVENOUS | Status: DC | PRN
  Administered 2020-11-05: 20 mg via INTRAVENOUS
  Administered 2020-11-05 (×2): 50 mg via INTRAVENOUS
  Administered 2020-11-05: 30 mg via INTRAVENOUS
  Administered 2020-11-05 (×2): 25 mg via INTRAVENOUS
  Administered 2020-11-05 (×2): 20 mg via INTRAVENOUS

## 2020-11-05 NOTE — Anesthesia Postprocedure Evaluation (Signed)
Anesthesia Post Note  Patient: Karla Maxwell  Procedure(s) Performed: COLONOSCOPY WITH PROPOFOL ESOPHAGOGASTRODUODENOSCOPY (EGD) WITH PROPOFOL BIOPSY  Patient location during evaluation: Endoscopy Anesthesia Type: General Level of consciousness: awake and alert Pain management: pain level controlled Vital Signs Assessment: post-procedure vital signs reviewed and stable Respiratory status: spontaneous breathing Cardiovascular status: blood pressure returned to baseline and stable Postop Assessment: no apparent nausea or vomiting Anesthetic complications: no   No notable events documented.   Last Vitals:  Vitals:   11/05/20 0702  BP: (!) 154/66  Resp: 14  Temp: 37 C  SpO2: 96%    Last Pain:  Vitals:   11/05/20 0738  TempSrc:   PainSc: 0-No pain                 Wren Gallaga

## 2020-11-05 NOTE — Discharge Instructions (Addendum)
You are being discharged to home.  Resume your previous diet.  We are waiting for your pathology results.  Your physician has indicated that a repeat colonoscopy is not recommended due to your current age (41 years or older) for screening purposes.  Continue oral iron supplementation.

## 2020-11-05 NOTE — Transfer of Care (Signed)
Immediate Anesthesia Transfer of Care Note  Patient: Karla Maxwell  Procedure(s) Performed: COLONOSCOPY WITH PROPOFOL ESOPHAGOGASTRODUODENOSCOPY (EGD) WITH PROPOFOL BIOPSY  Patient Location: Endoscopy Unit  Anesthesia Type:General  Level of Consciousness: awake  Airway & Oxygen Therapy: Patient Spontanous Breathing  Post-op Assessment: Report given to RN  Post vital signs: Reviewed and stable  Last Vitals:  Vitals Value Taken Time  BP    Temp    Pulse    Resp    SpO2      Last Pain:  Vitals:   11/05/20 0738  TempSrc:   PainSc: 0-No pain      Patients Stated Pain Goal: 6 (00/17/49 4496)  Complications: No notable events documented.

## 2020-11-05 NOTE — Op Note (Signed)
[facility] Patient Name: Karla Maxwell Procedure Date: 11/05/2020 7:51 AM MRN: [id number] Date of Birth: 05-07-1950 Attending MD: Maylon Peppers ,  CSN: [id number] Age: 70 Admit Type: Outpatient Procedure:                Colonoscopy Indications:              Clinically significant diarrhea of unexplained                            origin, Iron deficiency anemia Providers:                Maylon Peppers, Lambert Mody, Kristine L.                            Risa Grill, Technician Referring MD:              Medicines:                Monitored Anesthesia Care Complications:            No immediate complications. Estimated Blood Loss:     Estimated blood loss: none. Procedure:                Pre-Anesthesia Assessment:                           - Prior to the procedure, a History and Physical                            was performed, and patient medications, allergies                            and sensitivities were reviewed. The patient's                            tolerance of previous anesthesia was reviewed.                           - The risks and benefits of the procedure and the                            sedation options and risks were discussed with the                            patient. All questions were answered and informed                            consent was obtained.                           - ASA Grade Assessment: II - A patient with mild                            systemic disease.                           After obtaining informed consent, the colonoscope  was passed under direct vision. Throughout the                            procedure, the patient's blood pressure, pulse, and                            oxygen saturations were monitored continuously. The                            [id number]-[id number] ([id number]) scope was introduced through                            the anus and advanced to the the cecum, identified                             by appendiceal orifice and ileocecal valve. The                            colonoscopy was technically difficult and complex                            due to a redundant colon. Successful completion of                            the procedure was aided by withdrawing the scope                            and replacing with the pediatric endoscope The                            patient tolerated the procedure well. The quality                            of the bowel preparation was excellent. and placing                            the patient on her back Scope In: 7:54:42 AM Scope Out: 8:44:24 AM Scope Withdrawal Time: 0 hours 11 minutes 40 seconds  Total Procedure Duration: 0 hours 49 minutes 42 seconds  Findings:      Hemorrhoids were found on perianal exam and a small rectal prolapse.      A few medium-mouthed diverticula were found in the sigmoid colon.      There was a small lipoma, in the ascending colon.      The sigmoid colon was significantly redundant. This could not be       traversed with pediatric scope, was difficult to advance. Had to switch       to ultra slim scope and put patient on her back.      The rest of the colon appeared normal. Biopsies for histology were taken       with a cold forceps from the right colon and left colon for evaluation       of microscopic colitis.      The retroflexed view of the distal rectum and anal verge was normal  and       showed no anal or rectal abnormalities. Impression:               - Hemorrhoids and rectal prolapse found on perianal                            exam.                           - Diverticulosis in the sigmoid colon.                           - Small lipoma in the ascending colon.                           - Redundant colon.                           - The rest of the examined colon is normal.                            Biopsied.                           - The distal rectum and anal verge are normal on                             retroflexion view. Moderate Sedation:      Per Anesthesia Care Recommendation:           - Discharge patient to home (ambulatory).                           - Resume previous diet.                           - Await pathology results.                           - Repeat colonoscopy is not recommended due to                            current age (31 years or older) for screening                            purposes.                           - Continue oral iron supplementation. Procedure Code(s):        --- Professional ---                           [phone], Colonoscopy, flexible; with biopsy, single                            or multiple Diagnosis Code(s):        --- Professional ---  K64.9, Unspecified hemorrhoids                           D17.5, Benign lipomatous neoplasm of                            intra-abdominal organs                           R19.7, Diarrhea, unspecified                           D50.9, Iron deficiency anemia, unspecified                           K57.30, Diverticulosis of large intestine without                            perforation or abscess without bleeding                           Q43.8, Other specified congenital malformations of                            intestine CPT copyright 2019 American Medical Association. All rights reserved. The codes documented in this report are preliminary and upon coder review may  be revised to meet current compliance requirements. Maylon Peppers, MD Maylon Peppers,  11/05/2020 8:52:25 AM This report has been signed electronically. Number of Addenda: 0

## 2020-11-05 NOTE — Op Note (Signed)
[facility] Patient Name: Karla Maxwell Procedure Date: 11/05/2020 7:06 AM MRN: [id number] Date of Birth: 09-12-1950 Attending MD: Maylon Peppers ,  CSN: [id number] Age: 70 Admit Type: Outpatient Procedure:                Upper GI endoscopy Indications:              Iron deficiency anemia, Diarrhea Providers:                Maylon Peppers, Lambert Mody, Kristine L.                            Risa Grill, Technician Referring MD:              Medicines:                Monitored Anesthesia Care Complications:            No immediate complications. Estimated Blood Loss:     Estimated blood loss: none. Procedure:                Pre-Anesthesia Assessment:                           - Prior to the procedure, a History and Physical                            was performed, and patient medications, allergies                            and sensitivities were reviewed. The patient's                            tolerance of previous anesthesia was reviewed.                           - The risks and benefits of the procedure and the                            sedation options and risks were discussed with the                            patient. All questions were answered and informed                            consent was obtained.                           - ASA Grade Assessment: II - A patient with mild                            systemic disease.                           After obtaining informed consent, the endoscope was                            passed under direct vision. Throughout the  procedure, the patient's blood pressure, pulse, and                            oxygen saturations were monitored continuously. The                            GIF-H190 ([id number]) was introduced through the                            mouth, and advanced to the second part of duodenum.                            The upper GI endoscopy was accomplished without                             difficulty. The patient tolerated the procedure                            well. Scope In: 7:42:57 AM Scope Out: 7:48:40 AM Total Procedure Duration: 0 hours 5 minutes 43 seconds  Findings:      A 2 cm hiatal hernia was present.      The entire examined stomach was normal.      The examined duodenum was normal. Biopsies were taken with a cold       forceps for histology. Impression:               - 2 cm hiatal hernia.                           - Normal stomach.                           - Normal examined duodenum. Biopsied. Moderate Sedation:      Per Anesthesia Care Recommendation:           - Discharge patient to home (ambulatory).                           - Resume previous diet.                           - Await pathology results. Procedure Code(s):        --- Professional ---                           [phone], Esophagogastroduodenoscopy, flexible,                            transoral; with biopsy, single or multiple Diagnosis Code(s):        --- Professional ---                           K44.9, Diaphragmatic hernia without obstruction or                            gangrene  D50.9, Iron deficiency anemia, unspecified                           R19.7, Diarrhea, unspecified CPT copyright 2019 American Medical Association. All rights reserved. The codes documented in this report are preliminary and upon coder review may  be revised to meet current compliance requirements. Maylon Peppers, MD Maylon Peppers,  11/05/2020 7:54:05 AM This report has been signed electronically. Number of Addenda: 0

## 2020-11-05 NOTE — Anesthesia Preprocedure Evaluation (Addendum)
Anesthesia Evaluation  Patient identified by MRN, date of birth, ID band Patient awake    Reviewed: Allergy & Precautions, NPO status , Patient's Chart, lab work & pertinent test results  History of Anesthesia Complications Negative for: history of anesthetic complications  Airway Mallampati: I  TM Distance: >3 FB Neck ROM: Full    Dental  (+) Dental Advisory Given, Chipped, Missing   Pulmonary shortness of breath and with exertion, asthma , COPD,  COPD inhaler, Current SmokerPatient did not abstain from smoking.,    Pulmonary exam normal breath sounds clear to auscultation       Cardiovascular Exercise Tolerance: Good hypertension, Pt. on medications Normal cardiovascular exam Rhythm:Regular Rate:Normal     Neuro/Psych PSYCHIATRIC DISORDERS Depression    GI/Hepatic GERD  Medicated and Controlled,(+)     substance abuse  marijuana use,   Endo/Other  Hypothyroidism   Renal/GU negative Renal ROS     Musculoskeletal  (+) Arthritis  (back pain),   Abdominal   Peds  Hematology  (+) anemia ,   Anesthesia Other Findings Right arm amputation   Reproductive/Obstetrics                           Anesthesia Physical Anesthesia Plan  ASA: 3  Anesthesia Plan: General   Post-op Pain Management:    Induction:   PONV Risk Score and Plan:   Airway Management Planned: Nasal Cannula and Natural Airway  Additional Equipment:   Intra-op Plan:   Post-operative Plan:   Informed Consent: I have reviewed the patients History and Physical, chart, labs and discussed the procedure including the risks, benefits and alternatives for the proposed anesthesia with the patient or authorized representative who has indicated his/her understanding and acceptance.     Dental advisory given  Plan Discussed with: CRNA and Surgeon  Anesthesia Plan Comments:        Anesthesia Quick Evaluation
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
## 2020-11-06 LAB — SURGICAL PATHOLOGY

## 2020-11-06 LAB — IGA: IgA: 44 mg/dL

## 2020-11-07 DIAGNOSIS — M9903 Segmental and somatic dysfunction of lumbar region: Secondary | ICD-10-CM | POA: Diagnosis not present

## 2020-11-07 DIAGNOSIS — M9901 Segmental and somatic dysfunction of cervical region: Secondary | ICD-10-CM | POA: Diagnosis not present

## 2020-11-07 DIAGNOSIS — M9902 Segmental and somatic dysfunction of thoracic region: Secondary | ICD-10-CM | POA: Diagnosis not present

## 2020-11-07 DIAGNOSIS — M5137 Other intervertebral disc degeneration, lumbosacral region: Secondary | ICD-10-CM | POA: Diagnosis not present

## 2020-11-08 LAB — TISSUE TRANSGLUTAMINASE, IGA: Tissue Transglutaminase Ab, IgA: <2 U/mL (ref 0–3)

## 2020-11-11 ENCOUNTER — Other Ambulatory Visit: Payer: Self-pay | Admitting: Nurse Practitioner

## 2020-11-11 DIAGNOSIS — I10 Essential (primary) hypertension: Secondary | ICD-10-CM

## 2020-11-13 ENCOUNTER — Encounter (HOSPITAL_COMMUNITY): Payer: Self-pay | Admitting: Gastroenterology

## 2020-11-15 DIAGNOSIS — J449 Chronic obstructive pulmonary disease, unspecified: Secondary | ICD-10-CM | POA: Diagnosis not present

## 2020-11-18 ENCOUNTER — Other Ambulatory Visit: Payer: Self-pay

## 2020-11-18 ENCOUNTER — Encounter: Payer: Self-pay | Admitting: Nurse Practitioner

## 2020-11-18 ENCOUNTER — Ambulatory Visit (INDEPENDENT_AMBULATORY_CARE_PROVIDER_SITE_OTHER): Payer: Medicare Other | Admitting: Nurse Practitioner

## 2020-11-18 VITALS — BP 114/65 | HR 82 | Temp 97.6°F | Resp 20 | Ht 61.0 in | Wt 138.0 lb

## 2020-11-18 DIAGNOSIS — J449 Chronic obstructive pulmonary disease, unspecified: Secondary | ICD-10-CM

## 2020-11-18 DIAGNOSIS — Z720 Tobacco use: Secondary | ICD-10-CM

## 2020-11-18 DIAGNOSIS — I10 Essential (primary) hypertension: Secondary | ICD-10-CM | POA: Diagnosis not present

## 2020-11-18 DIAGNOSIS — M5442 Lumbago with sciatica, left side: Secondary | ICD-10-CM

## 2020-11-18 DIAGNOSIS — G8929 Other chronic pain: Secondary | ICD-10-CM

## 2020-11-18 DIAGNOSIS — E039 Hypothyroidism, unspecified: Secondary | ICD-10-CM

## 2020-11-18 DIAGNOSIS — K219 Gastro-esophageal reflux disease without esophagitis: Secondary | ICD-10-CM | POA: Diagnosis not present

## 2020-11-18 DIAGNOSIS — E8881 Metabolic syndrome: Secondary | ICD-10-CM | POA: Diagnosis not present

## 2020-11-18 DIAGNOSIS — M5441 Lumbago with sciatica, right side: Secondary | ICD-10-CM

## 2020-11-18 DIAGNOSIS — Z6834 Body mass index (BMI) 34.0-34.9, adult: Secondary | ICD-10-CM

## 2020-11-18 DIAGNOSIS — F330 Major depressive disorder, recurrent, mild: Secondary | ICD-10-CM

## 2020-11-18 DIAGNOSIS — R739 Hyperglycemia, unspecified: Secondary | ICD-10-CM | POA: Diagnosis not present

## 2020-11-18 DIAGNOSIS — D509 Iron deficiency anemia, unspecified: Secondary | ICD-10-CM

## 2020-11-18 DIAGNOSIS — E782 Mixed hyperlipidemia: Secondary | ICD-10-CM

## 2020-11-18 LAB — CMP14+EGFR
ALT: 9 IU/L (ref 0–32)
AST: 11 IU/L (ref 0–40)
Albumin/Globulin Ratio: 2.2 (ref 1.2–2.2)
Albumin: 4.3 g/dL (ref 3.8–4.8)
Alkaline Phosphatase: 57 IU/L (ref 44–121)
BUN/Creatinine Ratio: 16 (ref 12–28)
BUN: 11 mg/dL (ref 8–27)
Bilirubin Total: 0.3 mg/dL (ref 0.0–1.2)
CO2: 22 mmol/L (ref 20–29)
Calcium: 10 mg/dL (ref 8.7–10.3)
Chloride: 103 mmol/L (ref 96–106)
Creatinine, Ser: 0.69 mg/dL (ref 0.57–1.00)
Globulin, Total: 2 g/dL (ref 1.5–4.5)
Glucose: 95 mg/dL (ref 65–99)
Potassium: 4.3 mmol/L (ref 3.5–5.2)
Sodium: 140 mmol/L (ref 134–144)
Total Protein: 6.3 g/dL (ref 6.0–8.5)
eGFR: 93 mL/min/{1.73_m2} (ref >59)

## 2020-11-18 LAB — CBC WITH DIFFERENTIAL/PLATELET
Basophils Absolute: 0.1 10*3/uL (ref 0.0–0.2)
Basos: 1 %
EOS (ABSOLUTE): 0.1 10*3/uL (ref 0.0–0.4)
Eos: 1 %
Hematocrit: 42.1 % (ref 34.0–46.6)
Hemoglobin: 13.4 g/dL (ref 11.1–15.9)
Immature Grans (Abs): 0 10*3/uL (ref 0.0–0.1)
Immature Granulocytes: 0 %
Lymphocytes Absolute: 5.4 10*3/uL — ABNORMAL HIGH (ref 0.7–3.1)
Lymphs: 43 %
MCH: 28.9 pg (ref 26.6–33.0)
MCHC: 31.8 g/dL (ref 31.5–35.7)
MCV: 91 fL (ref 79–97)
Monocytes Absolute: 0.9 10*3/uL (ref 0.1–0.9)
Monocytes: 7 %
Neutrophils Absolute: 6.1 10*3/uL (ref 1.4–7.0)
Neutrophils: 48 %
Platelets: 444 10*3/uL (ref 150–450)
RBC: 4.63 x10E6/uL (ref 3.77–5.28)
RDW: 14.1 % (ref 11.7–15.4)
WBC: 12.6 10*3/uL — ABNORMAL HIGH (ref 3.4–10.8)

## 2020-11-18 LAB — LIPID PANEL
Chol/HDL Ratio: 2.9 ratio (ref 0.0–4.4)
Cholesterol, Total: 135 mg/dL (ref 100–199)
HDL: 47 mg/dL (ref >39)
LDL Chol Calc (NIH): 66 mg/dL (ref 0–99)
Triglycerides: 127 mg/dL (ref 0–149)
VLDL Cholesterol Cal: 22 mg/dL (ref 5–40)

## 2020-11-18 LAB — BAYER DCA HB A1C WAIVED: HB A1C (BAYER DCA - WAIVED): 5.8 % (ref <7.0)

## 2020-11-18 MED ORDER — ATORVASTATIN CALCIUM 80 MG PO TABS: 80.0000 mg | ORAL_TABLET | Freq: Every day | ORAL | 1 refills | Status: DC

## 2020-11-18 MED ORDER — LEVOTHYROXINE SODIUM 75 MCG PO TABS: 75.0000 ug | ORAL_TABLET | Freq: Every day | ORAL | 1 refills | Status: DC

## 2020-11-18 MED ORDER — FENOFIBRATE 134 MG PO CAPS: ORAL_CAPSULE | ORAL | 1 refills | Status: DC

## 2020-11-18 MED ORDER — OMEPRAZOLE 40 MG PO CPDR: 40.0000 mg | DELAYED_RELEASE_CAPSULE | Freq: Every day | ORAL | 1 refills | Status: DC

## 2020-11-18 MED ORDER — MONTELUKAST SODIUM 10 MG PO TABS: 10.0000 mg | ORAL_TABLET | Freq: Every day | ORAL | 1 refills | Status: DC

## 2020-11-18 MED ORDER — AMLODIPINE BESYLATE 10 MG PO TABS: 10.0000 mg | ORAL_TABLET | Freq: Every day | ORAL | 1 refills | Status: DC

## 2020-11-18 MED ORDER — TIZANIDINE HCL 4 MG PO TABS
4.0000 mg | ORAL_TABLET | Freq: Every day | ORAL | 1 refills | Status: DC
Start: 2020-11-18 — End: 2021-05-22

## 2020-11-18 MED ORDER — BUDESONIDE-FORMOTEROL FUMARATE 160-4.5 MCG/ACT IN AERO: INHALATION_SPRAY | RESPIRATORY_TRACT | 1 refills | Status: DC

## 2020-11-18 MED ORDER — FERROUS SULFATE 325 (65 FE) MG PO TABS: 325.0000 mg | ORAL_TABLET | Freq: Every day | ORAL | 1 refills | Status: DC

## 2020-11-18 NOTE — Addendum Note (Signed)
Addended by: Chevis Pretty on: 11/18/2020 09:01 AM   Modules accepted: Orders
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
## 2020-11-18 NOTE — Patient Instructions (Signed)
Textbook of family medicine (9th ed., pp. 1062-1073). [place], [place]: [employer].">  Stress, Adult Stress is a normal reaction to life events. Stress is what you feel when life demands more than you are used to, or more than you think you can handle. Some stress can be useful, such as studying for a test or meeting a deadline at work. Stress that occurs too often or for too long can cause problems. It can affect your emotional health and interfere with relationships and normal daily activities. Too much stress can weaken your body's defense system (immune system) and increase your risk for physical illness. If you already have a medicalproblem, stress can make it worse. What are the causes? All sorts of life events can cause stress. An event that causes stress for one person may not be stressful for another person. Major life events, whether positive or negative, commonly cause stress. Examples include: Losing a job or starting a new job. Losing a loved one. Moving to a new town or home. Getting married or divorced. Having a baby. Getting injured or sick. Less obvious life events can also cause stress, especially if they occur day after day or in combination with each other. Examples include: Working long hours. Driving in traffic. Caring for children. Being in debt. Being in a difficult relationship. What are the signs or symptoms? Stress can cause emotional symptoms, including: Anxiety. This is feeling worried, afraid, on edge, overwhelmed, or out of control. Anger, including irritation or impatience. Depression. This is feeling sad, down, helpless, or guilty. Trouble focusing, remembering, or making decisions. Stress can cause physical symptoms, including: Aches and pains. These may affect your head, neck, back, stomach, or other areas of your body. Tight muscles or a clenched jaw. Low energy. Trouble sleeping. Stress can cause unhealthy behaviors, including: Eating to feel better  (overeating) or skipping meals. Working too much or putting off tasks. Smoking, drinking alcohol, or using drugs to feel better. How is this diagnosed? Stress is diagnosed through an assessment by your health care provider. He or she may diagnose this condition based on: Your symptoms and any stressful life events. Your medical history. Tests to rule out other causes of your symptoms. Depending on your condition, your health care provider may refer you to aspecialist for further evaluation. How is this treated?  Stress management techniques are the recommended treatment for stress. Medicineis not typically recommended for the treatment of stress. Techniques to reduce your reaction to stressful life events include: Stress identification. Monitor yourself for symptoms of stress and identify what causes stress for you. These skills may help you to avoid or prepare for stressful events. Time management. Set your priorities, keep a calendar of events, and learn to say no. Taking these actions can help you avoid making too many commitments. Techniques for coping with stress include: Rethinking the problem. Try to think realistically about stressful events rather than ignoring them or overreacting. Try to find the positives in a stressful situation rather than focusing on the negatives. Exercise. Physical exercise can release both physical and emotional tension. The key is to find a form of exercise that you enjoy and do it regularly. Relaxation techniques. These relax the body and mind. The key is to find one or more that you enjoy and use the techniques regularly. Examples include: Meditation, deep breathing, or progressive relaxation techniques. Yoga or tai chi. Biofeedback, mindfulness techniques, or journaling. Listening to music, being out in nature, or participating in other hobbies. Practicing a healthy lifestyle.   Eat a balanced diet, drink plenty of water, limit or avoid caffeine, and get  plenty of sleep. Having a strong support network. Spend time with family, friends, or other people you enjoy being around. Express your feelings and talk things over with someone you trust. Counseling or talk therapy with a mental health professional may be helpful if you are havingtrouble managing stress on your own. Follow these instructions at home: Lifestyle  Avoid drugs. Do not use any products that contain nicotine or tobacco, such as cigarettes, e-cigarettes, and chewing tobacco. If you need help quitting, ask your health care provider. Limit alcohol intake to no more than 1 drink a day for nonpregnant women and 2 drinks a day for men. One drink equals 12 oz of beer, 5 oz of wine, or 1 oz of hard liquor Do not use alcohol or drugs to relax. Eat a balanced diet that includes fresh fruits and vegetables, whole grains, lean meats, fish, eggs, and beans, and low-fat dairy. Avoid processed foods and foods high in added fat, sugar, and salt. Exercise at least 30 minutes on 5 or more days each week. Get 7-8 hours of sleep each night.  General instructions  Practice stress management techniques as discussed with your health care provider. Drink enough fluid to keep your urine clear or pale yellow. Take over-the-counter and prescription medicines only as told by your health care provider. Keep all follow-up visits as told by your health care provider. This is important.  Contact a health care provider if: Your symptoms get worse. You have new symptoms. You feel overwhelmed by your problems and can no longer manage them on your own. Get help right away if: You have thoughts of hurting yourself or others. If you ever feel like you may hurt yourself or others, or have thoughts about taking your own life, get help right away. You can go to your nearest emergency department or call: Your local emergency services (911 in the [place] A suicide crisis helpline, such as the [facility] at 4253100524. This is open 24 hours a day. Summary Stress is a normal reaction to life events. It can cause problems if it happens too often or for too long. Practicing stress management techniques is the best way to treat stress. Counseling or talk therapy with a mental health professional may be helpful if you are having trouble managing stress on your own. This information is not intended to replace advice given to you by your health care provider. Make sure you discuss any questions you have with your healthcare provider. Document Revised: 01/05/2020 Document Reviewed: 01/05/2020 Elsevier Patient Education  2022 [employer].

## 2020-11-21 DIAGNOSIS — M9902 Segmental and somatic dysfunction of thoracic region: Secondary | ICD-10-CM | POA: Diagnosis not present

## 2020-11-21 DIAGNOSIS — M9903 Segmental and somatic dysfunction of lumbar region: Secondary | ICD-10-CM | POA: Diagnosis not present

## 2020-11-21 DIAGNOSIS — M9901 Segmental and somatic dysfunction of cervical region: Secondary | ICD-10-CM | POA: Diagnosis not present

## 2020-11-21 DIAGNOSIS — M5137 Other intervertebral disc degeneration, lumbosacral region: Secondary | ICD-10-CM | POA: Diagnosis not present

## 2020-12-16 DIAGNOSIS — J449 Chronic obstructive pulmonary disease, unspecified: Secondary | ICD-10-CM | POA: Diagnosis not present

## 2020-12-19 DIAGNOSIS — M9903 Segmental and somatic dysfunction of lumbar region: Secondary | ICD-10-CM | POA: Diagnosis not present

## 2020-12-19 DIAGNOSIS — M5137 Other intervertebral disc degeneration, lumbosacral region: Secondary | ICD-10-CM | POA: Diagnosis not present

## 2020-12-19 DIAGNOSIS — M9902 Segmental and somatic dysfunction of thoracic region: Secondary | ICD-10-CM | POA: Diagnosis not present

## 2020-12-19 DIAGNOSIS — M9901 Segmental and somatic dysfunction of cervical region: Secondary | ICD-10-CM | POA: Diagnosis not present

## 2021-01-16 DIAGNOSIS — J449 Chronic obstructive pulmonary disease, unspecified: Secondary | ICD-10-CM | POA: Diagnosis not present

## 2021-01-16 DIAGNOSIS — M9901 Segmental and somatic dysfunction of cervical region: Secondary | ICD-10-CM | POA: Diagnosis not present

## 2021-01-16 DIAGNOSIS — M9903 Segmental and somatic dysfunction of lumbar region: Secondary | ICD-10-CM | POA: Diagnosis not present

## 2021-01-16 DIAGNOSIS — M9902 Segmental and somatic dysfunction of thoracic region: Secondary | ICD-10-CM | POA: Diagnosis not present

## 2021-01-16 DIAGNOSIS — M5137 Other intervertebral disc degeneration, lumbosacral region: Secondary | ICD-10-CM | POA: Diagnosis not present

## 2021-02-12 DIAGNOSIS — M9901 Segmental and somatic dysfunction of cervical region: Secondary | ICD-10-CM | POA: Diagnosis not present

## 2021-02-12 DIAGNOSIS — M5137 Other intervertebral disc degeneration, lumbosacral region: Secondary | ICD-10-CM | POA: Diagnosis not present

## 2021-02-12 DIAGNOSIS — M9902 Segmental and somatic dysfunction of thoracic region: Secondary | ICD-10-CM | POA: Diagnosis not present

## 2021-02-12 DIAGNOSIS — M9903 Segmental and somatic dysfunction of lumbar region: Secondary | ICD-10-CM | POA: Diagnosis not present

## 2021-02-13 ENCOUNTER — Inpatient Hospital Stay (HOSPITAL_COMMUNITY): Payer: Medicare Other | Attending: Hematology

## 2021-02-13 ENCOUNTER — Other Ambulatory Visit: Payer: Self-pay

## 2021-02-13 DIAGNOSIS — Z7982 Long term (current) use of aspirin: Secondary | ICD-10-CM | POA: Insufficient documentation

## 2021-02-13 DIAGNOSIS — D179 Benign lipomatous neoplasm, unspecified: Secondary | ICD-10-CM | POA: Diagnosis not present

## 2021-02-13 DIAGNOSIS — E611 Iron deficiency: Secondary | ICD-10-CM | POA: Diagnosis not present

## 2021-02-13 DIAGNOSIS — Z803 Family history of malignant neoplasm of breast: Secondary | ICD-10-CM | POA: Insufficient documentation

## 2021-02-13 DIAGNOSIS — K449 Diaphragmatic hernia without obstruction or gangrene: Secondary | ICD-10-CM | POA: Diagnosis not present

## 2021-02-13 DIAGNOSIS — Z800 Family history of malignant neoplasm of digestive organs: Secondary | ICD-10-CM | POA: Insufficient documentation

## 2021-02-13 DIAGNOSIS — Z809 Family history of malignant neoplasm, unspecified: Secondary | ICD-10-CM | POA: Diagnosis not present

## 2021-02-13 DIAGNOSIS — K649 Unspecified hemorrhoids: Secondary | ICD-10-CM | POA: Insufficient documentation

## 2021-02-13 DIAGNOSIS — D75839 Thrombocytosis, unspecified: Secondary | ICD-10-CM | POA: Insufficient documentation

## 2021-02-13 DIAGNOSIS — F17210 Nicotine dependence, cigarettes, uncomplicated: Secondary | ICD-10-CM | POA: Insufficient documentation

## 2021-02-13 LAB — CBC WITH DIFFERENTIAL/PLATELET
Abs Immature Granulocytes: 0.05 10*3/uL (ref 0.00–0.07)
Basophils Absolute: 0 10*3/uL (ref 0.0–0.1)
Basophils Relative: 0 %
Eosinophils Absolute: 0.1 10*3/uL (ref 0.0–0.5)
Eosinophils Relative: 1 %
HCT: 41.2 % (ref 36.0–46.0)
Hemoglobin: 13.5 g/dL (ref 12.0–15.0)
Immature Granulocytes: 0 %
Lymphocytes Relative: 26 %
Lymphs Abs: 4.1 10*3/uL — ABNORMAL HIGH (ref 0.7–4.0)
MCH: 30.1 pg (ref 26.0–34.0)
MCHC: 32.8 g/dL (ref 30.0–36.0)
MCV: 92 fL (ref 80.0–100.0)
Monocytes Absolute: 0.8 10*3/uL (ref 0.1–1.0)
Monocytes Relative: 5 %
Neutro Abs: 10.5 10*3/uL — ABNORMAL HIGH (ref 1.7–7.7)
Neutrophils Relative %: 68 %
Platelets: 421 10*3/uL — ABNORMAL HIGH (ref 150–400)
RBC: 4.48 MIL/uL (ref 3.87–5.11)
RDW: 14.7 % (ref 11.5–15.5)
Smear Review: NORMAL
WBC: 15.5 10*3/uL — ABNORMAL HIGH (ref 4.0–10.5)
nRBC: 0 % (ref 0.0–0.2)

## 2021-02-13 LAB — IRON AND TIBC
Iron: 42 ug/dL (ref 28–170)
Saturation Ratios: 10 % — ABNORMAL LOW (ref 10.4–31.8)
TIBC: 415 ug/dL (ref 250–450)
UIBC: 373 ug/dL

## 2021-02-13 LAB — FERRITIN: Ferritin: 33 ng/mL (ref 11–307)

## 2021-02-15 DIAGNOSIS — J449 Chronic obstructive pulmonary disease, unspecified: Secondary | ICD-10-CM | POA: Diagnosis not present

## 2021-02-18 LAB — BCR-ABL1 FISH
Cells Analyzed: 200
Cells Counted: 200
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
## 2021-02-20 ENCOUNTER — Other Ambulatory Visit: Payer: Self-pay

## 2021-02-20 ENCOUNTER — Inpatient Hospital Stay (HOSPITAL_COMMUNITY): Payer: Medicare Other | Admitting: Physician Assistant

## 2021-02-20 VITALS — BP 126/49 | HR 75 | Temp 97.1°F | Resp 16 | Wt 132.0 lb

## 2021-02-20 DIAGNOSIS — Z803 Family history of malignant neoplasm of breast: Secondary | ICD-10-CM | POA: Diagnosis not present

## 2021-02-20 DIAGNOSIS — D75839 Thrombocytosis, unspecified: Secondary | ICD-10-CM

## 2021-02-20 DIAGNOSIS — E611 Iron deficiency: Secondary | ICD-10-CM

## 2021-02-20 DIAGNOSIS — D72829 Elevated white blood cell count, unspecified: Secondary | ICD-10-CM | POA: Diagnosis not present

## 2021-02-20 DIAGNOSIS — Z7982 Long term (current) use of aspirin: Secondary | ICD-10-CM | POA: Diagnosis not present

## 2021-02-20 DIAGNOSIS — K449 Diaphragmatic hernia without obstruction or gangrene: Secondary | ICD-10-CM | POA: Diagnosis not present

## 2021-02-20 DIAGNOSIS — D179 Benign lipomatous neoplasm, unspecified: Secondary | ICD-10-CM | POA: Diagnosis not present

## 2021-02-20 DIAGNOSIS — F17210 Nicotine dependence, cigarettes, uncomplicated: Secondary | ICD-10-CM | POA: Diagnosis not present

## 2021-02-20 DIAGNOSIS — K649 Unspecified hemorrhoids: Secondary | ICD-10-CM | POA: Diagnosis not present

## 2021-02-20 DIAGNOSIS — Z800 Family history of malignant neoplasm of digestive organs: Secondary | ICD-10-CM | POA: Diagnosis not present

## 2021-02-20 DIAGNOSIS — Z809 Family history of malignant neoplasm, unspecified: Secondary | ICD-10-CM | POA: Diagnosis not present

## 2021-02-20 NOTE — Patient Instructions (Signed)
[place] at [facility] Discharge Instructions  You were seen today by Tarri Abernethy PA-C for your elevated white blood cells, elevated platelets, and low iron.  As we discussed, your elevated white blood cells and elevated platelets are most likely reactive due to inflammation and smoking.  Your iron remains low despite being on the iron pill.  I recommend IV iron, but understand that the cost may be too much to agree to IV iron.  I will speak with our financial advisor and see if we have any options available for you.  If we are unable to find a cheaper alternative to the IV iron, you should definitely continue to take the iron pill at home.  Take the iron pill with a glass of orange juice, because this may help your body to absorb it better.  LABS: Return in 6 months for repeat labs  OTHER TESTS: No other tests at this time  MEDICATIONS: No changes to home medications  FOLLOW-UP APPOINTMENT: Office visit in 6 months, after labs   Thank you for choosing [place] at [facility] to provide your oncology and hematology care.  To afford each patient quality time with our provider, please arrive at least 15 minutes before your scheduled appointment time.   If you have a lab appointment with the [facility] please come in thru the Main Entrance and check in at the main information desk.  You need to re-schedule your appointment should you arrive 10 or more minutes late.  We strive to give you quality time with our providers, and arriving late affects you and other patients whose appointments are after yours.  Also, if you no show three or more times for appointments you may be dismissed from the clinic at the providers discretion.     Again, thank you for choosing [facility].  Our hope is that these requests will decrease the amount of time that you wait before being seen by our physicians.        _____________________________________________________________  Should you have questions after your visit to [facility], please contact our office at [phone] and follow the prompts.  Our office hours are 8:00 a.m. and 4:30 p.m. Monday - Friday.  Please note that voicemails left after 4:00 p.m. may not be returned until the following business day.  We are closed weekends and major holidays.  You do have access to a nurse 24-7, just call the main number to the clinic [phone] and do not press any options, hold on the line and a nurse will answer the phone.    For prescription refill requests, have your pharmacy contact our office and allow 72 hours.    Due to Covid, you will need to wear a mask upon entering the hospital. If you do not have a mask, a mask will be given to you at the Main Entrance upon arrival. For doctor visits, patients may have 1 support person age 52 or older with them. For treatment visits, patients can not have anyone with them due to social distancing guidelines and our immunocompromised population.

## 2021-02-25 ENCOUNTER — Telehealth (HOSPITAL_COMMUNITY): Payer: Self-pay | Admitting: Hematology

## 2021-02-26 ENCOUNTER — Telehealth (HOSPITAL_COMMUNITY): Payer: Self-pay | Admitting: Physician Assistant

## 2021-03-18 DIAGNOSIS — J449 Chronic obstructive pulmonary disease, unspecified: Secondary | ICD-10-CM | POA: Diagnosis not present

## 2021-04-17 DIAGNOSIS — J449 Chronic obstructive pulmonary disease, unspecified: Secondary | ICD-10-CM | POA: Diagnosis not present

## 2021-05-02 ENCOUNTER — Other Ambulatory Visit: Payer: Self-pay | Admitting: Nurse Practitioner

## 2021-05-07 DIAGNOSIS — M5137 Other intervertebral disc degeneration, lumbosacral region: Secondary | ICD-10-CM | POA: Diagnosis not present

## 2021-05-07 DIAGNOSIS — M9902 Segmental and somatic dysfunction of thoracic region: Secondary | ICD-10-CM | POA: Diagnosis not present

## 2021-05-07 DIAGNOSIS — M9901 Segmental and somatic dysfunction of cervical region: Secondary | ICD-10-CM | POA: Diagnosis not present

## 2021-05-07 DIAGNOSIS — M9903 Segmental and somatic dysfunction of lumbar region: Secondary | ICD-10-CM | POA: Diagnosis not present

## 2021-05-18 DIAGNOSIS — J449 Chronic obstructive pulmonary disease, unspecified: Secondary | ICD-10-CM | POA: Diagnosis not present

## 2021-05-22 ENCOUNTER — Other Ambulatory Visit: Payer: Self-pay | Admitting: Nurse Practitioner

## 2021-05-22 ENCOUNTER — Encounter: Payer: Self-pay | Admitting: Nurse Practitioner

## 2021-05-22 ENCOUNTER — Ambulatory Visit (INDEPENDENT_AMBULATORY_CARE_PROVIDER_SITE_OTHER): Payer: Medicare Other | Admitting: Nurse Practitioner

## 2021-05-22 VITALS — BP 140/68 | HR 76 | Temp 97.8°F | Resp 20 | Ht 61.0 in | Wt 128.0 lb

## 2021-05-22 DIAGNOSIS — I10 Essential (primary) hypertension: Secondary | ICD-10-CM | POA: Diagnosis not present

## 2021-05-22 DIAGNOSIS — E8881 Metabolic syndrome: Secondary | ICD-10-CM

## 2021-05-22 DIAGNOSIS — Z1231 Encounter for screening mammogram for malignant neoplasm of breast: Secondary | ICD-10-CM

## 2021-05-22 DIAGNOSIS — Z6834 Body mass index (BMI) 34.0-34.9, adult: Secondary | ICD-10-CM

## 2021-05-22 DIAGNOSIS — K529 Noninfective gastroenteritis and colitis, unspecified: Secondary | ICD-10-CM | POA: Diagnosis not present

## 2021-05-22 DIAGNOSIS — K219 Gastro-esophageal reflux disease without esophagitis: Secondary | ICD-10-CM | POA: Diagnosis not present

## 2021-05-22 DIAGNOSIS — M5442 Lumbago with sciatica, left side: Secondary | ICD-10-CM

## 2021-05-22 DIAGNOSIS — M8580 Other specified disorders of bone density and structure, unspecified site: Secondary | ICD-10-CM

## 2021-05-22 DIAGNOSIS — J449 Chronic obstructive pulmonary disease, unspecified: Secondary | ICD-10-CM

## 2021-05-22 DIAGNOSIS — M5441 Lumbago with sciatica, right side: Secondary | ICD-10-CM

## 2021-05-22 DIAGNOSIS — Z23 Encounter for immunization: Secondary | ICD-10-CM

## 2021-05-22 DIAGNOSIS — Z720 Tobacco use: Secondary | ICD-10-CM

## 2021-05-22 DIAGNOSIS — E782 Mixed hyperlipidemia: Secondary | ICD-10-CM | POA: Diagnosis not present

## 2021-05-22 DIAGNOSIS — E039 Hypothyroidism, unspecified: Secondary | ICD-10-CM

## 2021-05-22 DIAGNOSIS — R739 Hyperglycemia, unspecified: Secondary | ICD-10-CM | POA: Diagnosis not present

## 2021-05-22 DIAGNOSIS — D509 Iron deficiency anemia, unspecified: Secondary | ICD-10-CM | POA: Diagnosis not present

## 2021-05-22 DIAGNOSIS — G8929 Other chronic pain: Secondary | ICD-10-CM

## 2021-05-22 DIAGNOSIS — J4489 Other specified chronic obstructive pulmonary disease: Secondary | ICD-10-CM

## 2021-05-22 LAB — BAYER DCA HB A1C WAIVED: HB A1C (BAYER DCA - WAIVED): 5.5 % (ref 4.8–5.6)

## 2021-05-22 MED ORDER — MONTELUKAST SODIUM 10 MG PO TABS: 10.0000 mg | ORAL_TABLET | Freq: Every day | ORAL | 1 refills | Status: DC

## 2021-05-22 MED ORDER — AMLODIPINE BESYLATE 10 MG PO TABS: 10.0000 mg | ORAL_TABLET | Freq: Every day | ORAL | 1 refills | Status: DC

## 2021-05-22 MED ORDER — LEVOTHYROXINE SODIUM 75 MCG PO TABS: 75.0000 ug | ORAL_TABLET | Freq: Every day | ORAL | 1 refills | Status: DC

## 2021-05-22 MED ORDER — FENOFIBRATE 134 MG PO CAPS: ORAL_CAPSULE | ORAL | 1 refills | Status: DC

## 2021-05-22 MED ORDER — FERROUS SULFATE 325 (65 FE) MG PO TABS: 325.0000 mg | ORAL_TABLET | Freq: Every day | ORAL | 1 refills | Status: DC

## 2021-05-22 MED ORDER — ATORVASTATIN CALCIUM 80 MG PO TABS: 80.0000 mg | ORAL_TABLET | Freq: Every day | ORAL | 1 refills | Status: DC

## 2021-05-22 MED ORDER — TIZANIDINE HCL 4 MG PO TABS: 4.0000 mg | ORAL_TABLET | Freq: Every day | ORAL | 1 refills | Status: DC

## 2021-05-22 MED ORDER — OMEPRAZOLE 40 MG PO CPDR: 40.0000 mg | DELAYED_RELEASE_CAPSULE | Freq: Every day | ORAL | 1 refills | Status: DC

## 2021-05-22 MED ORDER — BUDESONIDE-FORMOTEROL FUMARATE 160-4.5 MCG/ACT IN AERO: INHALATION_SPRAY | RESPIRATORY_TRACT | 1 refills | Status: DC

## 2021-05-22 NOTE — Addendum Note (Signed)
Addended by: Rolena Infante on: 05/22/2021 09:26 AM   Modules accepted: Orders

## 2021-05-22 NOTE — Patient Instructions (Signed)
Managing the Challenge of Quitting Smoking ?Quitting smoking is a physical and mental challenge. You will face cravings, withdrawal symptoms, and temptation. Before quitting, work with your health care provider to make a plan that can help you manage quitting. Preparation can help you quit and keep you from giving in. ?How to manage lifestyle changes ?Managing stress ?Stress can make you want to smoke, and wanting to smoke may cause stress. It is important to find ways to manage your stress. You might try some of the following: ?Practice relaxation techniques. ?Breathe slowly and deeply, in through your nose and out through your mouth. ?Listen to music. ?Soak in a bath or take a shower. ?Imagine a peaceful place or vacation. ?Get some support. ?Talk with family or friends about your stress. ?Join a support group. ?Talk with a counselor or therapist. ?Get some physical activity. ?Go for a walk, run, or bike ride. ?Play a favorite sport. ?Practice yoga. ? ?Medicines ?Talk with your health care provider about medicines that might help you deal with cravings and make quitting easier for you. ?Relationships ?Social situations can be difficult when you are quitting smoking. To manage this, you can: ?Avoid parties and other social situations where people might be smoking. ?Avoid alcohol. ?Leave right away if you have the urge to smoke. ?Explain to your family and friends that you are quitting smoking. Ask for support and let them know you might be a bit grumpy. ?Plan activities where smoking is not an option. ?General instructions ?Be aware that many people gain weight after they quit smoking. However, not everyone does. To keep from gaining weight, have a plan in place before you quit and stick to the plan after you quit. Your plan should include: ?Having healthy snacks. When you have a craving, it may help to: ?Eat popcorn, carrots, celery, or other cut vegetables. ?Chew sugar-free gum. ?Changing how you eat. ?Eat small  portion sizes at meals. ?Eat 4-6 small meals throughout the day instead of 1-2 large meals a day. ?Be mindful when you eat. Do not watch television or do other things that might distract you as you eat. ?Exercising regularly. ?Make time to exercise each day. If you do not have time for a long workout, do short bouts of exercise for 5-10 minutes several times a day. ?Do some form of strengthening exercise, such as weight lifting. ?Do some exercise that gets your heart beating and causes you to breathe deeply, such as walking fast, running, swimming, or biking. This is very important. ?Drinking plenty of water or other low-calorie or no-calorie drinks. Drink 6-8 glasses of water daily. ? ?How to recognize withdrawal symptoms ?Your body and mind may experience discomfort as you try to get used to not having nicotine in your system. These effects are called withdrawal symptoms. They may include: ?Feeling hungrier than normal. ?Having trouble concentrating. ?Feeling irritable or restless. ?Having trouble sleeping. ?Feeling depressed. ?Craving a cigarette. ?To manage withdrawal symptoms: ?Avoid places, people, and activities that trigger your cravings. ?Remember why you want to quit. ?Get plenty of sleep. ?Avoid coffee and other caffeinated drinks. These may worsen some of your symptoms. ?These symptoms may surprise you. But be assured that they are normal to have when quitting smoking. ?How to manage cravings ?Come up with a plan for how to deal with your cravings. The plan should include the following: ?A definition of the specific situation you want to deal with. ?An alternative action you will take. ?A clear idea for how this action   will help. ?The name of someone who might help you with this. ?Cravings usually last for 5-10 minutes. Consider taking the following actions to help you with your plan to deal with cravings: ?Keep your mouth busy. ?Chew sugar-free gum. ?Suck on hard candies or a straw. ?Brush your  teeth. ?Keep your hands and body busy. ?Change to a different activity right away. ?Squeeze or play with a ball. ?Do an activity or a hobby, such as making bead jewelry, practicing needlepoint, or working with wood. ?Mix up your normal routine. ?Take a short exercise break. Go for a quick walk or run up and down stairs. ?Focus on doing something kind or helpful for someone else. ?Call a friend or family member to talk during a craving. ?Join a support group. ?Contact a quitline. ?Where to find support ?To get help or find a support group: ?Call the National Cancer Institute's Smoking Quitline: 1-800-QUIT NOW (784-8669) ?Visit the website of the Substance Abuse and Mental Health Services Administration: www.samhsa.gov ?Text QUIT to SmokefreeTXT: 478848 ?Where to find more information ?Visit these websites to find more information on quitting smoking: ?National Cancer Institute: www.smokefree.gov ?American Lung Association: www.lung.org ?American Cancer Society: www.cancer.org ?Centers for Disease Control and Prevention: www.cdc.gov ?American Heart Association: www.heart.org ?Contact a health care provider if: ?You want to change your plan for quitting. ?The medicines you are taking are not helping. ?Your eating feels out of control or you cannot sleep. ?Get help right away if: ?You feel depressed or become very anxious. ?Summary ?Quitting smoking is a physical and mental challenge. You will face cravings, withdrawal symptoms, and temptation to smoke again. Preparation can help you as you go through these challenges. ?Try different techniques to manage stress, handle social situations, and prevent weight gain. ?You can deal with cravings by keeping your mouth busy (such as by chewing gum), keeping your hands and body busy, calling family or friends, or contacting a quitline for people who want to quit smoking. ?You can deal with withdrawal symptoms by avoiding places where people smoke, getting plenty of rest, and  avoiding drinks with caffeine. ?This information is not intended to replace advice given to you by your health care provider. Make sure you discuss any questions you have with your health care provider. ?Document Revised: 12/27/2020 Document Reviewed: 02/07/2019 ?Elsevier Patient Education ? 2022 Elsevier Inc. ? ?

## 2021-05-22 NOTE — Addendum Note (Signed)
Addended by: Rolena Infante on: 05/22/2021 02:06 PM   Modules accepted: Orders
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
## 2021-05-23 LAB — CBC WITH DIFFERENTIAL/PLATELET
Basophils Absolute: 0.1 10*3/uL (ref 0.0–0.2)
Basos: 1 %
EOS (ABSOLUTE): 0.1 10*3/uL (ref 0.0–0.4)
Eos: 1 %
Hematocrit: 41.6 % (ref 34.0–46.6)
Hemoglobin: 14.6 g/dL (ref 11.1–15.9)
Immature Grans (Abs): 0 10*3/uL (ref 0.0–0.1)
Immature Granulocytes: 0 %
Lymphocytes Absolute: 4.1 10*3/uL — ABNORMAL HIGH (ref 0.7–3.1)
Lymphs: 34 %
MCH: 29.9 pg (ref 26.6–33.0)
MCHC: 35.1 g/dL (ref 31.5–35.7)
MCV: 85 fL (ref 79–97)
Monocytes Absolute: 0.7 10*3/uL (ref 0.1–0.9)
Monocytes: 6 %
Neutrophils Absolute: 7.2 10*3/uL — ABNORMAL HIGH (ref 1.4–7.0)
Neutrophils: 58 %
Platelets: 483 10*3/uL — ABNORMAL HIGH (ref 150–450)
RBC: 4.89 x10E6/uL (ref 3.77–5.28)
RDW: 13.9 % (ref 11.7–15.4)
WBC: 12.2 10*3/uL — ABNORMAL HIGH (ref 3.4–10.8)

## 2021-05-23 LAB — LIPID PANEL
Chol/HDL Ratio: 2.5 ratio (ref 0.0–4.4)
Cholesterol, Total: 154 mg/dL (ref 100–199)
HDL: 61 mg/dL (ref >39)
LDL Chol Calc (NIH): 73 mg/dL (ref 0–99)
Triglycerides: 114 mg/dL (ref 0–149)
VLDL Cholesterol Cal: 20 mg/dL (ref 5–40)

## 2021-05-23 LAB — CMP14+EGFR
ALT: 14 IU/L (ref 0–32)
AST: 17 IU/L (ref 0–40)
Albumin/Globulin Ratio: 2.5 — ABNORMAL HIGH (ref 1.2–2.2)
Albumin: 4.9 g/dL — ABNORMAL HIGH (ref 3.8–4.8)
Alkaline Phosphatase: 67 IU/L (ref 44–121)
BUN/Creatinine Ratio: 16 (ref 12–28)
BUN: 10 mg/dL (ref 8–27)
Bilirubin Total: 0.5 mg/dL (ref 0.0–1.2)
CO2: 20 mmol/L (ref 20–29)
Calcium: 10.8 mg/dL — ABNORMAL HIGH (ref 8.7–10.3)
Chloride: 100 mmol/L (ref 96–106)
Creatinine, Ser: 0.64 mg/dL (ref 0.57–1.00)
Globulin, Total: 2 g/dL (ref 1.5–4.5)
Glucose: 111 mg/dL — ABNORMAL HIGH (ref 70–99)
Potassium: 4.5 mmol/L (ref 3.5–5.2)
Sodium: 138 mmol/L (ref 134–144)
Total Protein: 6.9 g/dL (ref 6.0–8.5)
eGFR: 95 mL/min/{1.73_m2} (ref >59)

## 2021-05-23 LAB — THYROID PANEL WITH TSH
Free Thyroxine Index: 3 (ref 1.2–4.9)
T3 Uptake Ratio: 28 % (ref 24–39)
T4, Total: 10.8 ug/dL (ref 4.5–12.0)
TSH: 1.42 u[IU]/mL (ref 0.450–4.500)

## 2021-05-28 ENCOUNTER — Telehealth: Payer: Self-pay | Admitting: Nurse Practitioner

## 2021-05-28 NOTE — Telephone Encounter (Signed)
Pt not really using her oxygen that much and would like order sent to [place] to discontinue. Handwritten Rx placed on providers desk for signature

## 2021-06-02 ENCOUNTER — Ambulatory Visit
Admission: RE | Admit: 2021-06-02 | Discharge: 2021-06-02 | Disposition: A | Discharge status: Home / Self Care | Payer: Medicare Other | Source: Ambulatory Visit | Attending: Nurse Practitioner | Admitting: Nurse Practitioner

## 2021-06-02 DIAGNOSIS — Z1231 Encounter for screening mammogram for malignant neoplasm of breast: Secondary | ICD-10-CM | POA: Diagnosis not present

## 2021-06-03 NOTE — Telephone Encounter (Signed)
DC order faxed to [facility]

## 2021-06-03 NOTE — Telephone Encounter (Signed)
Not received by [facility] yet

## 2021-06-04 DIAGNOSIS — M9901 Segmental and somatic dysfunction of cervical region: Secondary | ICD-10-CM | POA: Diagnosis not present

## 2021-06-04 DIAGNOSIS — M9903 Segmental and somatic dysfunction of lumbar region: Secondary | ICD-10-CM | POA: Diagnosis not present

## 2021-06-04 DIAGNOSIS — M9902 Segmental and somatic dysfunction of thoracic region: Secondary | ICD-10-CM | POA: Diagnosis not present

## 2021-06-04 DIAGNOSIS — M5137 Other intervertebral disc degeneration, lumbosacral region: Secondary | ICD-10-CM | POA: Diagnosis not present

## 2021-06-05 ENCOUNTER — Other Ambulatory Visit: Payer: Self-pay

## 2021-06-05 DIAGNOSIS — Z1211 Encounter for screening for malignant neoplasm of colon: Secondary | ICD-10-CM

## 2021-07-02 DIAGNOSIS — M9902 Segmental and somatic dysfunction of thoracic region: Secondary | ICD-10-CM | POA: Diagnosis not present

## 2021-07-02 DIAGNOSIS — M9903 Segmental and somatic dysfunction of lumbar region: Secondary | ICD-10-CM | POA: Diagnosis not present

## 2021-07-02 DIAGNOSIS — M5137 Other intervertebral disc degeneration, lumbosacral region: Secondary | ICD-10-CM | POA: Diagnosis not present

## 2021-07-02 DIAGNOSIS — M9901 Segmental and somatic dysfunction of cervical region: Secondary | ICD-10-CM | POA: Diagnosis not present

## 2021-07-29 ENCOUNTER — Other Ambulatory Visit: Payer: Self-pay | Admitting: Nurse Practitioner

## 2021-07-30 DIAGNOSIS — M9902 Segmental and somatic dysfunction of thoracic region: Secondary | ICD-10-CM | POA: Diagnosis not present

## 2021-07-30 DIAGNOSIS — M9903 Segmental and somatic dysfunction of lumbar region: Secondary | ICD-10-CM | POA: Diagnosis not present

## 2021-07-30 DIAGNOSIS — M9901 Segmental and somatic dysfunction of cervical region: Secondary | ICD-10-CM | POA: Diagnosis not present

## 2021-07-30 DIAGNOSIS — M5137 Other intervertebral disc degeneration, lumbosacral region: Secondary | ICD-10-CM | POA: Diagnosis not present

## 2021-08-21 ENCOUNTER — Inpatient Hospital Stay (HOSPITAL_COMMUNITY): Payer: Medicare Other | Attending: Hematology

## 2021-08-21 DIAGNOSIS — D75839 Thrombocytosis, unspecified: Secondary | ICD-10-CM

## 2021-08-21 DIAGNOSIS — D75838 Other thrombocytosis: Secondary | ICD-10-CM | POA: Insufficient documentation

## 2021-08-21 DIAGNOSIS — E079 Disorder of thyroid, unspecified: Secondary | ICD-10-CM | POA: Diagnosis not present

## 2021-08-21 DIAGNOSIS — M8580 Other specified disorders of bone density and structure, unspecified site: Secondary | ICD-10-CM | POA: Diagnosis not present

## 2021-08-21 DIAGNOSIS — D509 Iron deficiency anemia, unspecified: Secondary | ICD-10-CM | POA: Diagnosis not present

## 2021-08-21 DIAGNOSIS — Z79899 Other long term (current) drug therapy: Secondary | ICD-10-CM | POA: Diagnosis not present

## 2021-08-21 DIAGNOSIS — F17210 Nicotine dependence, cigarettes, uncomplicated: Secondary | ICD-10-CM | POA: Insufficient documentation

## 2021-08-21 DIAGNOSIS — D72829 Elevated white blood cell count, unspecified: Secondary | ICD-10-CM | POA: Insufficient documentation

## 2021-08-21 DIAGNOSIS — E611 Iron deficiency: Secondary | ICD-10-CM

## 2021-08-21 DIAGNOSIS — F1290 Cannabis use, unspecified, uncomplicated: Secondary | ICD-10-CM | POA: Diagnosis not present

## 2021-08-21 LAB — CBC WITH DIFFERENTIAL/PLATELET
Abs Immature Granulocytes: 0.05 10*3/uL (ref 0.00–0.07)
Basophils Absolute: 0.1 10*3/uL (ref 0.0–0.1)
Basophils Relative: 1 %
Eosinophils Absolute: 0.1 10*3/uL (ref 0.0–0.5)
Eosinophils Relative: 1 %
HCT: 40.4 % (ref 36.0–46.0)
Hemoglobin: 13.4 g/dL (ref 12.0–15.0)
Immature Granulocytes: 0 %
Lymphocytes Relative: 35 %
Lymphs Abs: 4.6 10*3/uL — ABNORMAL HIGH (ref 0.7–4.0)
MCH: 29.5 pg (ref 26.0–34.0)
MCHC: 33.2 g/dL (ref 30.0–36.0)
MCV: 89 fL (ref 80.0–100.0)
Monocytes Absolute: 0.9 10*3/uL (ref 0.1–1.0)
Monocytes Relative: 7 %
Neutro Abs: 7.5 10*3/uL (ref 1.7–7.7)
Neutrophils Relative %: 56 %
Platelets: 424 10*3/uL — ABNORMAL HIGH (ref 150–400)
RBC: 4.54 MIL/uL (ref 3.87–5.11)
RDW: 15 % (ref 11.5–15.5)
WBC: 13.2 10*3/uL — ABNORMAL HIGH (ref 4.0–10.5)
nRBC: 0 % (ref 0.0–0.2)

## 2021-08-21 LAB — LACTATE DEHYDROGENASE: LDH: 136 U/L (ref 98–192)

## 2021-08-21 LAB — IRON AND TIBC
Iron: 93 ug/dL (ref 28–170)
Saturation Ratios: 23 % (ref 10.4–31.8)
TIBC: 409 ug/dL (ref 250–450)
UIBC: 316 ug/dL

## 2021-08-21 LAB — SEDIMENTATION RATE: Sed Rate: 3 mm/hr (ref 0–22)

## 2021-08-21 LAB — C-REACTIVE PROTEIN: CRP: 0.6 mg/dL (ref <1.0)

## 2021-08-21 LAB — FERRITIN: Ferritin: 46 ng/mL (ref 11–307)
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
## 2021-08-25 ENCOUNTER — Inpatient Hospital Stay (HOSPITAL_COMMUNITY): Payer: Medicare Other | Admitting: Physician Assistant

## 2021-08-25 ENCOUNTER — Encounter (HOSPITAL_COMMUNITY): Payer: Self-pay | Admitting: Physician Assistant

## 2021-08-25 VITALS — BP 115/52 | HR 59 | Temp 97.0°F | Resp 17 | Ht 61.5 in | Wt 134.3 lb

## 2021-08-25 DIAGNOSIS — D72820 Lymphocytosis (symptomatic): Secondary | ICD-10-CM

## 2021-08-25 DIAGNOSIS — M8580 Other specified disorders of bone density and structure, unspecified site: Secondary | ICD-10-CM | POA: Diagnosis not present

## 2021-08-25 DIAGNOSIS — D75839 Thrombocytosis, unspecified: Secondary | ICD-10-CM

## 2021-08-25 DIAGNOSIS — D72829 Elevated white blood cell count, unspecified: Secondary | ICD-10-CM

## 2021-08-25 DIAGNOSIS — F17210 Nicotine dependence, cigarettes, uncomplicated: Secondary | ICD-10-CM | POA: Diagnosis not present

## 2021-08-25 DIAGNOSIS — Z87891 Personal history of nicotine dependence: Secondary | ICD-10-CM | POA: Diagnosis not present

## 2021-08-25 DIAGNOSIS — D509 Iron deficiency anemia, unspecified: Secondary | ICD-10-CM | POA: Diagnosis not present

## 2021-08-25 DIAGNOSIS — E611 Iron deficiency: Secondary | ICD-10-CM

## 2021-08-25 DIAGNOSIS — D75838 Other thrombocytosis: Secondary | ICD-10-CM | POA: Diagnosis not present

## 2021-08-25 DIAGNOSIS — Z79899 Other long term (current) drug therapy: Secondary | ICD-10-CM | POA: Diagnosis not present

## 2021-08-25 DIAGNOSIS — E079 Disorder of thyroid, unspecified: Secondary | ICD-10-CM | POA: Diagnosis not present

## 2021-08-25 NOTE — Patient Instructions (Signed)
[place] at [facility] ?Discharge Instructions ? ?You were seen today by Tarri Abernethy PA-C for your elevated white blood cells, elevated platelets, and low iron. ? ?As we discussed, your elevated white blood cells and elevated platelets are most likely reactive due to inflammation and smoking. ? ?Your iron level is slightly improved from taking the iron pill.  Continue to take iron tablet daily.  Take the iron pill with a glass of orange juice, because this may help your body to absorb it better. ? ?We will schedule you for a CT scan of your lungs in June 2023 for lung cancer screening due to your underlying risk and smoking history. ? ?LABS: Return in 6 months for repeat labs ? ?FOLLOW-UP APPOINTMENT: Office visit in 6 months, after labs ? ? ?Thank you for choosing [place] at [facility] to provide your oncology and hematology care.  To afford each patient quality time with our provider, please arrive at least 15 minutes before your scheduled appointment time.  ? ?If you have a lab appointment with the [facility] please come in thru the Main Entrance and check in at the main information desk. ? ?You need to re-schedule your appointment should you arrive 10 or more minutes late.  We strive to give you quality time with our providers, and arriving late affects you and other patients whose appointments are after yours.  Also, if you no show three or more times for appointments you may be dismissed from the clinic at the providers discretion.     ?Again, thank you for choosing [facility].  Our hope is that these requests will decrease the amount of time that you wait before being seen by our physicians.       ?_____________________________________________________________ ? ?Should you have questions after your visit to [facility], please contact our office at [phone] and follow the prompts.  Our office hours are 8:00 a.m.  and 4:30 p.m. Monday - Friday.  Please note that voicemails left after 4:00 p.m. may not be returned until the following business day.  We are closed weekends and major holidays.  You do have access to a nurse 24-7, just call the main number to the clinic [phone] and do not press any options, hold on the line and a nurse will answer the phone.   ? ?For prescription refill requests, have your pharmacy contact our office and allow 72 hours.   ? ?Due to Covid, you will need to wear a mask upon entering the hospital. If you do not have a mask, a mask will be given to you at the Main Entrance upon arrival. For doctor visits, patients may have 1 support person age 42 or older with them. For treatment visits, patients can not have anyone with them due to social distancing guidelines and our immunocompromised population.  ? ? ? ?

## 2021-08-27 DIAGNOSIS — M9902 Segmental and somatic dysfunction of thoracic region: Secondary | ICD-10-CM | POA: Diagnosis not present

## 2021-08-27 DIAGNOSIS — M9903 Segmental and somatic dysfunction of lumbar region: Secondary | ICD-10-CM | POA: Diagnosis not present

## 2021-08-27 DIAGNOSIS — M9901 Segmental and somatic dysfunction of cervical region: Secondary | ICD-10-CM | POA: Diagnosis not present

## 2021-08-27 DIAGNOSIS — M5137 Other intervertebral disc degeneration, lumbosacral region: Secondary | ICD-10-CM | POA: Diagnosis not present

## 2021-08-28 ENCOUNTER — Ambulatory Visit (HOSPITAL_COMMUNITY): Payer: Medicare Other | Admitting: Physician Assistant

## 2021-09-09 NOTE — Patient Instructions (Addendum)
Ms. Hoppes , ?Thank you for taking time to come for your Medicare Wellness Visit. I appreciate your ongoing commitment to your health goals. Please review the following plan we discussed and let me know if I can assist you in the future.  ? ?Screening recommendations/referrals: ?Colonoscopy: Done 11/05/2020 Repeat in 10 years ? ?Mammogram: Done 06/02/2021. Repeat annually ? ?Bone Density: Done 05/16/2020 Repeat every 2 years ? ?Recommended yearly ophthalmology/optometry visit for glaucoma screening and checkup ?Recommended yearly dental visit for hygiene and checkup ? ?Vaccinations: ?Influenza vaccine: Due Fall 2023. ?Pneumococcal vaccine: Done 02/24/2016 and 02/25/2017. ?Tdap vaccine: Done 05/22/2021 Repeat in 10 years ? ?Shingles vaccine: Done 05/14/2015   ?Covid-19:Done 08/08/2019 ? ?Advanced directives: Advance directive discussed with you today. Even though you declined this today, please call our office should you change your mind, and we can give you the proper paperwork for you to fill out. ? ? ?Conditions/risks identified: KEEP UP THE GOOD WORK!!  ?Aim for 30 minutes of exercise or brisk walking, 6-8 glasses of water, and 5 servings of fruits and vegetables each day. ? ? ?Next appointment: Follow up in one year for your annual wellness visit 2024 ? ? ?Preventive Care 71 Years and Older, Female ?Preventive care refers to lifestyle choices and visits with your health care provider that can promote health and wellness. ?What does preventive care include? ?A yearly physical exam. This is also called an annual well check. ?Dental exams once or twice a year. ?Routine eye exams. Ask your health care provider how often you should have your eyes checked. ?Personal lifestyle choices, including: ?Daily care of your teeth and gums. ?Regular physical activity. ?Eating a healthy diet. ?Avoiding tobacco and drug use. ?Limiting alcohol use. ?Practicing safe sex. ?Taking low-dose aspirin every day. ?Taking vitamin and mineral  supplements as recommended by your health care provider. ?What happens during an annual well check? ?The services and screenings done by your health care provider during your annual well check will depend on your age, overall health, lifestyle risk factors, and family history of disease. ?Counseling  ?Your health care provider may ask you questions about your: ?Alcohol use. ?Tobacco use. ?Drug use. ?Emotional well-being. ?Home and relationship well-being. ?Sexual activity. ?Eating habits. ?History of falls. ?Memory and ability to understand (cognition). ?Work and work Statistician. ?Reproductive health. ?Screening  ?You may have the following tests or measurements: ?Height, weight, and BMI. ?Blood pressure. ?Lipid and cholesterol levels. These may be checked every 5 years, or more frequently if you are over 60 years old. ?Skin check. ?Lung cancer screening. You may have this screening every year starting at age 71 if you have a 30-pack-year history of smoking and currently smoke or have quit within the past 15 years. ?Fecal occult blood test (FOBT) of the stool. You may have this test every year starting at age 71. ?Flexible sigmoidoscopy or colonoscopy. You may have a sigmoidoscopy every 5 years or a colonoscopy every 10 years starting at age 45. ?Hepatitis C blood test. ?Hepatitis B blood test. ?Sexually transmitted disease (STD) testing. ?Diabetes screening. This is done by checking your blood sugar (glucose) after you have not eaten for a while (fasting). You may have this done every 1-3 years. ?Bone density scan. This is done to screen for osteoporosis. You may have this done starting at age 71. ?Mammogram. This may be done every 1-2 years. Talk to your health care provider about how often you should have regular mammograms. ?Talk with your health care provider about your test  results, treatment options, and if necessary, the need for more tests. ?Vaccines  ?Your health care provider may recommend certain  vaccines, such as: ?Influenza vaccine. This is recommended every year. ?Tetanus, diphtheria, and acellular pertussis (Tdap, Td) vaccine. You may need a Td booster every 10 years. ?Zoster vaccine. You may need this after age 71. ?Pneumococcal 13-valent conjugate (PCV13) vaccine. One dose is recommended after age 8. ?Pneumococcal polysaccharide (PPSV23) vaccine. One dose is recommended after age 71. ?Talk to your health care provider about which screenings and vaccines you need and how often you need them. ?This information is not intended to replace advice given to you by your health care provider. Make sure you discuss any questions you have with your health care provider. ?Document Released: 05/17/2015 Document Revised: 01/08/2016 Document Reviewed: 02/19/2015 ?Elsevier Interactive Patient Education ? 2017 [place]. ? ?Fall Prevention in the Home ?Falls can cause injuries. They can happen to people of all ages. There are many things you can do to make your home safe and to help prevent falls. ?What can I do on the outside of my home? ?Regularly fix the edges of walkways and driveways and fix any cracks. ?Remove anything that might make you trip as you walk through a door, such as a raised step or threshold. ?Trim any bushes or trees on the path to your home. ?Use bright outdoor lighting. ?Clear any walking paths of anything that might make someone trip, such as rocks or tools. ?Regularly check to see if handrails are loose or broken. Make sure that both sides of any steps have handrails. ?Any raised decks and porches should have guardrails on the edges. ?Have any leaves, snow, or ice cleared regularly. ?Use sand or salt on walking paths during winter. ?Clean up any spills in your garage right away. This includes oil or grease spills. ?What can I do in the bathroom? ?Use night lights. ?Install grab bars by the toilet and in the tub and shower. Do not use towel bars as grab bars. ?Use non-skid mats or decals in  the tub or shower. ?If you need to sit down in the shower, use a plastic, non-slip stool. ?Keep the floor dry. Clean up any water that spills on the floor as soon as it happens. ?Remove soap buildup in the tub or shower regularly. ?Attach bath mats securely with double-sided non-slip rug tape. ?Do not have throw rugs and other things on the floor that can make you trip. ?What can I do in the bedroom? ?Use night lights. ?Make sure that you have a light by your bed that is easy to reach. ?Do not use any sheets or blankets that are too big for your bed. They should not hang down onto the floor. ?Have a firm chair that has side arms. You can use this for support while you get dressed. ?Do not have throw rugs and other things on the floor that can make you trip. ?What can I do in the kitchen? ?Clean up any spills right away. ?Avoid walking on wet floors. ?Keep items that you use a lot in easy-to-reach places. ?If you need to reach something above you, use a strong step stool that has a grab bar. ?Keep electrical cords out of the way. ?Do not use floor polish or wax that makes floors slippery. If you must use wax, use non-skid floor wax. ?Do not have throw rugs and other things on the floor that can make you trip. ?What can I do with my stairs? ?  Do not leave any items on the stairs. ?Make sure that there are handrails on both sides of the stairs and use them. Fix handrails that are broken or loose. Make sure that handrails are as long as the stairways. ?Check any carpeting to make sure that it is firmly attached to the stairs. Fix any carpet that is loose or worn. ?Avoid having throw rugs at the top or bottom of the stairs. If you do have throw rugs, attach them to the floor with carpet tape. ?Make sure that you have a light switch at the top of the stairs and the bottom of the stairs. If you do not have them, ask someone to add them for you. ?What else can I do to help prevent falls? ?Wear shoes that: ?Do not have high  heels. ?Have rubber bottoms. ?Are comfortable and fit you well. ?Are closed at the toe. Do not wear sandals. ?If you use a stepladder: ?Make sure that it is fully opened. Do not climb a closed stepladder. ?Make su

## 2021-09-10 ENCOUNTER — Ambulatory Visit (INDEPENDENT_AMBULATORY_CARE_PROVIDER_SITE_OTHER): Payer: Medicare Other

## 2021-09-10 VITALS — Ht 61.5 in | Wt 132.0 lb

## 2021-09-10 DIAGNOSIS — Z0000 Encounter for general adult medical examination without abnormal findings: Secondary | ICD-10-CM
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
## 2021-09-24 DIAGNOSIS — M9902 Segmental and somatic dysfunction of thoracic region: Secondary | ICD-10-CM | POA: Diagnosis not present

## 2021-09-24 DIAGNOSIS — M9901 Segmental and somatic dysfunction of cervical region: Secondary | ICD-10-CM | POA: Diagnosis not present

## 2021-09-24 DIAGNOSIS — M9903 Segmental and somatic dysfunction of lumbar region: Secondary | ICD-10-CM | POA: Diagnosis not present

## 2021-09-24 DIAGNOSIS — M5137 Other intervertebral disc degeneration, lumbosacral region: Secondary | ICD-10-CM | POA: Diagnosis not present

## 2021-10-16 ENCOUNTER — Ambulatory Visit (HOSPITAL_COMMUNITY)
Admission: RE | Admit: 2021-10-16 | Discharge: 2021-10-16 | Disposition: A | Discharge status: Home / Self Care | Payer: Medicare Other | Source: Ambulatory Visit | Attending: Physician Assistant | Admitting: Physician Assistant

## 2021-10-16 DIAGNOSIS — Z87891 Personal history of nicotine dependence: Secondary | ICD-10-CM

## 2021-10-16 DIAGNOSIS — F17210 Nicotine dependence, cigarettes, uncomplicated: Secondary | ICD-10-CM | POA: Diagnosis not present

## 2021-10-22 DIAGNOSIS — M9901 Segmental and somatic dysfunction of cervical region: Secondary | ICD-10-CM | POA: Diagnosis not present

## 2021-10-22 DIAGNOSIS — M9903 Segmental and somatic dysfunction of lumbar region: Secondary | ICD-10-CM | POA: Diagnosis not present

## 2021-10-22 DIAGNOSIS — M5137 Other intervertebral disc degeneration, lumbosacral region: Secondary | ICD-10-CM | POA: Diagnosis not present

## 2021-10-22 DIAGNOSIS — M9902 Segmental and somatic dysfunction of thoracic region: Secondary | ICD-10-CM | POA: Diagnosis not present

## 2021-10-23 ENCOUNTER — Other Ambulatory Visit: Payer: Self-pay | Admitting: Nurse Practitioner

## 2021-11-10 ENCOUNTER — Ambulatory Visit: Payer: Self-pay | Admitting: *Deleted

## 2021-11-10 DIAGNOSIS — E782 Mixed hyperlipidemia: Secondary | ICD-10-CM

## 2021-11-10 DIAGNOSIS — I10 Essential (primary) hypertension: Secondary | ICD-10-CM

## 2021-11-10 NOTE — Chronic Care Management (AMB) (Signed)
  Chronic Care Management   Note  11/10/2021 Name: Karla Maxwell MRN: [id number] DOB: 01/05/1951   Patient has not recently engaged with the Chronic Care Management RN Care Manager. Removing RN Care Manager from Care Team and closing [place]. If patient is currently engaged with another CCM team member I will forward this encounter to inform them of my case closure. Patient may be eligible for re-engagement with RN Care Manager in the future if necessary and can discuss this with their PCP.  [name], BSN, RN-BC Embedded Chronic Care Manager [facility] [place] [facility] Direct Dial: [phone]

## 2021-11-19 DIAGNOSIS — M9903 Segmental and somatic dysfunction of lumbar region: Secondary | ICD-10-CM | POA: Diagnosis not present

## 2021-11-19 DIAGNOSIS — M9902 Segmental and somatic dysfunction of thoracic region: Secondary | ICD-10-CM | POA: Diagnosis not present

## 2021-11-19 DIAGNOSIS — M5137 Other intervertebral disc degeneration, lumbosacral region: Secondary | ICD-10-CM | POA: Diagnosis not present

## 2021-11-19 DIAGNOSIS — M9901 Segmental and somatic dysfunction of cervical region: Secondary | ICD-10-CM | POA: Diagnosis not present

## 2021-11-20 ENCOUNTER — Encounter: Payer: Self-pay | Admitting: Nurse Practitioner

## 2021-11-20 ENCOUNTER — Ambulatory Visit (INDEPENDENT_AMBULATORY_CARE_PROVIDER_SITE_OTHER): Payer: Medicare Other | Admitting: Nurse Practitioner

## 2021-11-20 VITALS — BP 137/61 | HR 62 | Temp 97.9°F | Resp 20 | Ht 61.0 in | Wt 133.0 lb

## 2021-11-20 DIAGNOSIS — L989 Disorder of the skin and subcutaneous tissue, unspecified: Secondary | ICD-10-CM | POA: Diagnosis not present

## 2021-11-20 DIAGNOSIS — R739 Hyperglycemia, unspecified: Secondary | ICD-10-CM | POA: Diagnosis not present

## 2021-11-20 DIAGNOSIS — M8580 Other specified disorders of bone density and structure, unspecified site: Secondary | ICD-10-CM

## 2021-11-20 DIAGNOSIS — E782 Mixed hyperlipidemia: Secondary | ICD-10-CM | POA: Diagnosis not present

## 2021-11-20 DIAGNOSIS — E039 Hypothyroidism, unspecified: Secondary | ICD-10-CM | POA: Diagnosis not present

## 2021-11-20 DIAGNOSIS — E8881 Metabolic syndrome: Secondary | ICD-10-CM

## 2021-11-20 DIAGNOSIS — I10 Essential (primary) hypertension: Secondary | ICD-10-CM | POA: Diagnosis not present

## 2021-11-20 DIAGNOSIS — F330 Major depressive disorder, recurrent, mild: Secondary | ICD-10-CM

## 2021-11-20 DIAGNOSIS — D509 Iron deficiency anemia, unspecified: Secondary | ICD-10-CM

## 2021-11-20 DIAGNOSIS — K219 Gastro-esophageal reflux disease without esophagitis: Secondary | ICD-10-CM | POA: Diagnosis not present

## 2021-11-20 DIAGNOSIS — J449 Chronic obstructive pulmonary disease, unspecified: Secondary | ICD-10-CM

## 2021-11-20 DIAGNOSIS — Z720 Tobacco use: Secondary | ICD-10-CM

## 2021-11-20 DIAGNOSIS — J4489 Other specified chronic obstructive pulmonary disease: Secondary | ICD-10-CM

## 2021-11-20 DIAGNOSIS — Z6834 Body mass index (BMI) 34.0-34.9, adult: Secondary | ICD-10-CM

## 2021-11-20 LAB — BAYER DCA HB A1C WAIVED: HB A1C (BAYER DCA - WAIVED): 5.4 % (ref 4.8–5.6)

## 2021-11-20 MED ORDER — ATORVASTATIN CALCIUM 80 MG PO TABS: 80.0000 mg | ORAL_TABLET | Freq: Every day | ORAL | 1 refills | Status: DC

## 2021-11-20 MED ORDER — FENOFIBRATE 134 MG PO CAPS
ORAL_CAPSULE | ORAL | 1 refills | Status: DC
Start: 2021-11-20 — End: 2022-05-25

## 2021-11-20 MED ORDER — FERROUS SULFATE 325 (65 FE) MG PO TABS: 325.0000 mg | ORAL_TABLET | Freq: Every day | ORAL | 1 refills | Status: DC

## 2021-11-20 MED ORDER — BUDESONIDE-FORMOTEROL FUMARATE 160-4.5 MCG/ACT IN AERO
INHALATION_SPRAY | RESPIRATORY_TRACT | 1 refills | Status: DC
Start: 2021-11-20 — End: 2022-05-25

## 2021-11-20 MED ORDER — MONTELUKAST SODIUM 10 MG PO TABS
10.0000 mg | ORAL_TABLET | Freq: Every day | ORAL | 1 refills | Status: DC
Start: 2021-11-20 — End: 2022-05-25

## 2021-11-20 MED ORDER — OMEPRAZOLE 40 MG PO CPDR: 40.0000 mg | DELAYED_RELEASE_CAPSULE | Freq: Every day | ORAL | 1 refills | Status: DC

## 2021-11-20 MED ORDER — LEVOTHYROXINE SODIUM 75 MCG PO TABS: 75.0000 ug | ORAL_TABLET | Freq: Every day | ORAL | 1 refills | Status: DC

## 2021-11-20 MED ORDER — AMLODIPINE BESYLATE 10 MG PO TABS: 10.0000 mg | ORAL_TABLET | Freq: Every day | ORAL | 1 refills | Status: DC

## 2021-11-20 NOTE — Patient Instructions (Signed)
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
## 2021-11-21 LAB — CBC WITH DIFFERENTIAL/PLATELET
Basophils Absolute: 0.1 10*3/uL (ref 0.0–0.2)
Basos: 1 %
EOS (ABSOLUTE): 0.2 10*3/uL (ref 0.0–0.4)
Eos: 1 %
Hematocrit: 41.9 % (ref 34.0–46.6)
Hemoglobin: 14 g/dL (ref 11.1–15.9)
Immature Grans (Abs): 0 10*3/uL (ref 0.0–0.1)
Immature Granulocytes: 0 %
Lymphocytes Absolute: 4.7 10*3/uL — ABNORMAL HIGH (ref 0.7–3.1)
Lymphs: 39 %
MCH: 29.7 pg (ref 26.6–33.0)
MCHC: 33.4 g/dL (ref 31.5–35.7)
MCV: 89 fL (ref 79–97)
Monocytes Absolute: 0.8 10*3/uL (ref 0.1–0.9)
Monocytes: 7 %
Neutrophils Absolute: 6.3 10*3/uL (ref 1.4–7.0)
Neutrophils: 52 %
Platelets: 487 10*3/uL — ABNORMAL HIGH (ref 150–450)
RBC: 4.72 x10E6/uL (ref 3.77–5.28)
RDW: 13 % (ref 11.7–15.4)
WBC: 12.1 10*3/uL — ABNORMAL HIGH (ref 3.4–10.8)

## 2021-11-21 LAB — CMP14+EGFR
ALT: 9 IU/L (ref 0–32)
AST: 10 IU/L (ref 0–40)
Albumin/Globulin Ratio: 2.4 — ABNORMAL HIGH (ref 1.2–2.2)
Albumin: 4.7 g/dL (ref 3.8–4.8)
Alkaline Phosphatase: 55 IU/L (ref 44–121)
BUN/Creatinine Ratio: 16 (ref 12–28)
BUN: 11 mg/dL (ref 8–27)
Bilirubin Total: 0.2 mg/dL (ref 0.0–1.2)
CO2: 22 mmol/L (ref 20–29)
Calcium: 10.2 mg/dL (ref 8.7–10.3)
Chloride: 104 mmol/L (ref 96–106)
Creatinine, Ser: 0.7 mg/dL (ref 0.57–1.00)
Globulin, Total: 2 g/dL (ref 1.5–4.5)
Glucose: 106 mg/dL — ABNORMAL HIGH (ref 70–99)
Potassium: 4.4 mmol/L (ref 3.5–5.2)
Sodium: 138 mmol/L (ref 134–144)
Total Protein: 6.7 g/dL (ref 6.0–8.5)
eGFR: 92 mL/min/{1.73_m2} (ref >59)

## 2021-11-21 LAB — LIPID PANEL
Chol/HDL Ratio: 2.4 ratio (ref 0.0–4.4)
Cholesterol, Total: 132 mg/dL (ref 100–199)
HDL: 54 mg/dL (ref >39)
LDL Chol Calc (NIH): 63 mg/dL (ref 0–99)
Triglycerides: 76 mg/dL (ref 0–149)
VLDL Cholesterol Cal: 15 mg/dL (ref 5–40)

## 2021-11-21 LAB — MICROALBUMIN / CREATININE URINE RATIO
Creatinine, Urine: 14.2 mg/dL
Microalb/Creat Ratio: 23 mg/g creat (ref 0–29)
Microalbumin, Urine: 3.3 ug/mL

## 2021-11-26 ENCOUNTER — Other Ambulatory Visit: Payer: Self-pay | Admitting: Nurse Practitioner

## 2021-11-26 DIAGNOSIS — G8929 Other chronic pain: Secondary | ICD-10-CM

## 2021-12-17 DIAGNOSIS — M9902 Segmental and somatic dysfunction of thoracic region: Secondary | ICD-10-CM | POA: Diagnosis not present

## 2021-12-17 DIAGNOSIS — M5137 Other intervertebral disc degeneration, lumbosacral region: Secondary | ICD-10-CM | POA: Diagnosis not present

## 2021-12-17 DIAGNOSIS — M9901 Segmental and somatic dysfunction of cervical region: Secondary | ICD-10-CM | POA: Diagnosis not present

## 2021-12-17 DIAGNOSIS — M9903 Segmental and somatic dysfunction of lumbar region: Secondary | ICD-10-CM | POA: Diagnosis not present

## 2022-01-21 DIAGNOSIS — M5137 Other intervertebral disc degeneration, lumbosacral region: Secondary | ICD-10-CM | POA: Diagnosis not present

## 2022-01-21 DIAGNOSIS — M9902 Segmental and somatic dysfunction of thoracic region: Secondary | ICD-10-CM | POA: Diagnosis not present

## 2022-01-21 DIAGNOSIS — M9901 Segmental and somatic dysfunction of cervical region: Secondary | ICD-10-CM | POA: Diagnosis not present

## 2022-01-21 DIAGNOSIS — M9903 Segmental and somatic dysfunction of lumbar region: Secondary | ICD-10-CM | POA: Diagnosis not present

## 2022-01-27 ENCOUNTER — Other Ambulatory Visit: Payer: Self-pay | Admitting: Nurse Practitioner

## 2022-02-18 DIAGNOSIS — M9902 Segmental and somatic dysfunction of thoracic region: Secondary | ICD-10-CM | POA: Diagnosis not present

## 2022-02-18 DIAGNOSIS — M9903 Segmental and somatic dysfunction of lumbar region: Secondary | ICD-10-CM | POA: Diagnosis not present

## 2022-02-18 DIAGNOSIS — M9901 Segmental and somatic dysfunction of cervical region: Secondary | ICD-10-CM | POA: Diagnosis not present

## 2022-02-18 DIAGNOSIS — M5137 Other intervertebral disc degeneration, lumbosacral region: Secondary | ICD-10-CM | POA: Diagnosis not present

## 2022-02-23 ENCOUNTER — Inpatient Hospital Stay: Payer: Medicare Other | Attending: Physician Assistant

## 2022-02-23 DIAGNOSIS — Z7982 Long term (current) use of aspirin: Secondary | ICD-10-CM | POA: Diagnosis not present

## 2022-02-23 DIAGNOSIS — D72829 Elevated white blood cell count, unspecified: Secondary | ICD-10-CM | POA: Diagnosis not present

## 2022-02-23 DIAGNOSIS — F17210 Nicotine dependence, cigarettes, uncomplicated: Secondary | ICD-10-CM | POA: Insufficient documentation

## 2022-02-23 DIAGNOSIS — D75839 Thrombocytosis, unspecified: Secondary | ICD-10-CM | POA: Diagnosis present

## 2022-02-23 DIAGNOSIS — Z800 Family history of malignant neoplasm of digestive organs: Secondary | ICD-10-CM | POA: Diagnosis not present

## 2022-02-23 DIAGNOSIS — D75838 Other thrombocytosis: Secondary | ICD-10-CM | POA: Diagnosis not present

## 2022-02-23 DIAGNOSIS — E611 Iron deficiency: Secondary | ICD-10-CM | POA: Diagnosis not present

## 2022-02-23 DIAGNOSIS — D72820 Lymphocytosis (symptomatic): Secondary | ICD-10-CM | POA: Diagnosis not present

## 2022-02-23 DIAGNOSIS — Z803 Family history of malignant neoplasm of breast: Secondary | ICD-10-CM | POA: Insufficient documentation

## 2022-02-23 LAB — CBC WITH DIFFERENTIAL/PLATELET
Abs Immature Granulocytes: 0.04 10*3/uL (ref 0.00–0.07)
Basophils Absolute: 0.1 10*3/uL (ref 0.0–0.1)
Basophils Relative: 0 %
Eosinophils Absolute: 0.1 10*3/uL (ref 0.0–0.5)
Eosinophils Relative: 1 %
HCT: 40.6 % (ref 36.0–46.0)
Hemoglobin: 13.6 g/dL (ref 12.0–15.0)
Immature Granulocytes: 0 %
Lymphocytes Relative: 38 %
Lymphs Abs: 4.5 10*3/uL — ABNORMAL HIGH (ref 0.7–4.0)
MCH: 30 pg (ref 26.0–34.0)
MCHC: 33.5 g/dL (ref 30.0–36.0)
MCV: 89.4 fL (ref 80.0–100.0)
Monocytes Absolute: 0.9 10*3/uL (ref 0.1–1.0)
Monocytes Relative: 8 %
Neutro Abs: 6.3 10*3/uL (ref 1.7–7.7)
Neutrophils Relative %: 53 %
Platelets: 447 10*3/uL — ABNORMAL HIGH (ref 150–400)
RBC: 4.54 MIL/uL (ref 3.87–5.11)
RDW: 14.8 % (ref 11.5–15.5)
WBC: 12 10*3/uL — ABNORMAL HIGH (ref 4.0–10.5)
nRBC: 0 % (ref 0.0–0.2)

## 2022-02-23 LAB — IRON AND TIBC
Iron: 71 ug/dL (ref 28–170)
Saturation Ratios: 18 % (ref 10.4–31.8)
TIBC: 405 ug/dL (ref 250–450)
UIBC: 334 ug/dL

## 2022-02-23 LAB — FERRITIN: Ferritin: 71 ng/mL (ref 11–307)

## 2022-02-24 LAB — SURGICAL PATHOLOGY
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
## 2022-03-02 ENCOUNTER — Inpatient Hospital Stay: Payer: Medicare Other | Admitting: Physician Assistant

## 2022-03-02 VITALS — BP 127/54 | HR 76 | Temp 97.0°F | Resp 18 | Ht 61.5 in | Wt 132.6 lb

## 2022-03-02 DIAGNOSIS — D75839 Thrombocytosis, unspecified: Secondary | ICD-10-CM | POA: Diagnosis not present

## 2022-03-02 DIAGNOSIS — Z800 Family history of malignant neoplasm of digestive organs: Secondary | ICD-10-CM | POA: Diagnosis not present

## 2022-03-02 DIAGNOSIS — Z7982 Long term (current) use of aspirin: Secondary | ICD-10-CM | POA: Diagnosis not present

## 2022-03-02 DIAGNOSIS — D72829 Elevated white blood cell count, unspecified: Secondary | ICD-10-CM

## 2022-03-02 DIAGNOSIS — F17210 Nicotine dependence, cigarettes, uncomplicated: Secondary | ICD-10-CM | POA: Diagnosis not present

## 2022-03-02 DIAGNOSIS — E611 Iron deficiency: Secondary | ICD-10-CM

## 2022-03-02 DIAGNOSIS — R918 Other nonspecific abnormal finding of lung field: Secondary | ICD-10-CM | POA: Diagnosis not present

## 2022-03-02 DIAGNOSIS — D75838 Other thrombocytosis: Secondary | ICD-10-CM | POA: Diagnosis not present

## 2022-03-02 DIAGNOSIS — Z803 Family history of malignant neoplasm of breast: Secondary | ICD-10-CM | POA: Diagnosis not present

## 2022-03-02 NOTE — Patient Instructions (Signed)
Karla Maxwell at [facility] Discharge Instructions  You were seen today by Tarri Abernethy PA-C for your elevated white blood cells, elevated platelets, and low iron.  As we discussed, your elevated white blood cells and elevated platelets are most likely reactive due to inflammation and smoking.  Your iron level is improved from taking the iron pill.  Continue to take iron tablet daily.  Take the iron pill with a glass of orange juice, because this may help your body to absorb it better.  We will schedule you for a CT scan of your lungs in December 2023 to follow-up on the lung nodules that were seen on your CT scan in June.  FOLLOW-UP APPOINTMENT: Labs and office visit in 1 year  ** Thank you for trusting me with your healthcare!  I strive to provide all of my patients with quality care at each visit.  If you receive a survey for this visit, I would be so grateful to you for taking the time to provide feedback.  Thank you in advance!  ~ Twyla Dais                   Dr. Derek Jack   &   Tarri Abernethy, PA-C   - - - - - - - - - - - - - - - - - -     Thank you for choosing [place] at [facility] to provide your oncology and hematology care.  To afford each patient quality time with our provider, please arrive at least 15 minutes before your scheduled appointment time.   If you have a lab appointment with the [facility] please come in thru the Main Entrance and check in at the main information desk.  You need to re-schedule your appointment should you arrive 10 or more minutes late.  We strive to give you quality time with our providers, and arriving late affects you and other patients whose appointments are after yours.  Also, if you no show three or more times for appointments you may be dismissed from the clinic at the providers discretion.     Again, thank you for choosing [facility].  Our hope is that these  requests will decrease the amount of time that you wait before being seen by our physicians.       _____________________________________________________________  Should you have questions after your visit to [facility], please contact our office at [phone] and follow the prompts.  Our office hours are 8:00 a.m. and 4:30 p.m. Monday - Friday.  Please note that voicemails left after 4:00 p.m. may not be returned until the following business day.  We are closed weekends and major holidays.  You do have access to a nurse 24-7, just call the main number to the clinic [phone] and do not press any options, hold on the line and a nurse will answer the phone.    For prescription refill requests, have your pharmacy contact our office and allow 72 hours.    Due to Covid, you will need to wear a mask upon entering the hospital. If you do not have a mask, a mask will be given to you at the Main Entrance upon arrival. For doctor visits, patients may have 1 support person age 41 or older with them. For treatment visits, patients can not have anyone with them due to social distancing guidelines and our immunocompromised population.

## 2022-03-17 LAB — FLOW CYTOMETRY

## 2022-03-18 DIAGNOSIS — M9903 Segmental and somatic dysfunction of lumbar region: Secondary | ICD-10-CM | POA: Diagnosis not present

## 2022-03-18 DIAGNOSIS — M9901 Segmental and somatic dysfunction of cervical region: Secondary | ICD-10-CM | POA: Diagnosis not present

## 2022-03-18 DIAGNOSIS — M9902 Segmental and somatic dysfunction of thoracic region: Secondary | ICD-10-CM | POA: Diagnosis not present

## 2022-03-18 DIAGNOSIS — M5137 Other intervertebral disc degeneration, lumbosacral region: Secondary | ICD-10-CM | POA: Diagnosis not present

## 2022-04-15 DIAGNOSIS — M9901 Segmental and somatic dysfunction of cervical region: Secondary | ICD-10-CM | POA: Diagnosis not present

## 2022-04-15 DIAGNOSIS — M5137 Other intervertebral disc degeneration, lumbosacral region: Secondary | ICD-10-CM | POA: Diagnosis not present

## 2022-04-15 DIAGNOSIS — M9903 Segmental and somatic dysfunction of lumbar region: Secondary | ICD-10-CM | POA: Diagnosis not present

## 2022-04-15 DIAGNOSIS — M9902 Segmental and somatic dysfunction of thoracic region: Secondary | ICD-10-CM | POA: Diagnosis not present

## 2022-04-17 ENCOUNTER — Ambulatory Visit (HOSPITAL_COMMUNITY)
Admission: RE | Admit: 2022-04-17 | Discharge: 2022-04-17 | Disposition: A | Discharge status: Home / Self Care | Payer: Medicare Other | Source: Ambulatory Visit | Attending: Physician Assistant | Admitting: Physician Assistant

## 2022-04-17 DIAGNOSIS — R918 Other nonspecific abnormal finding of lung field: Secondary | ICD-10-CM | POA: Insufficient documentation

## 2022-04-17 DIAGNOSIS — J432 Centrilobular emphysema: Secondary | ICD-10-CM | POA: Diagnosis not present

## 2022-04-20 ENCOUNTER — Telehealth: Payer: Self-pay | Admitting: *Deleted

## 2022-04-20 NOTE — Telephone Encounter (Signed)
Received call report on CT chest from [facility].  Dr. Delton Coombes made aware of results.

## 2022-05-13 DIAGNOSIS — M9901 Segmental and somatic dysfunction of cervical region: Secondary | ICD-10-CM | POA: Diagnosis not present

## 2022-05-13 DIAGNOSIS — M9903 Segmental and somatic dysfunction of lumbar region: Secondary | ICD-10-CM | POA: Diagnosis not present

## 2022-05-13 DIAGNOSIS — M9902 Segmental and somatic dysfunction of thoracic region: Secondary | ICD-10-CM | POA: Diagnosis not present

## 2022-05-13 DIAGNOSIS — M5137 Other intervertebral disc degeneration, lumbosacral region: Secondary | ICD-10-CM | POA: Diagnosis not present

## 2022-05-15 NOTE — Progress Notes (Signed)
Attempted to call patient to schedule follow-up LDCT. Unable to reach the patient at this time, VM not available at this time.

## 2022-05-25 ENCOUNTER — Encounter: Payer: Self-pay | Admitting: Nurse Practitioner

## 2022-05-25 ENCOUNTER — Ambulatory Visit (INDEPENDENT_AMBULATORY_CARE_PROVIDER_SITE_OTHER): Payer: Medicare Other | Admitting: Nurse Practitioner

## 2022-05-25 VITALS — BP 154/58 | HR 67 | Temp 98.0°F | Resp 20 | Ht 61.0 in | Wt 132.0 lb

## 2022-05-25 DIAGNOSIS — J4489 Other specified chronic obstructive pulmonary disease: Secondary | ICD-10-CM

## 2022-05-25 DIAGNOSIS — M5442 Lumbago with sciatica, left side: Secondary | ICD-10-CM | POA: Diagnosis not present

## 2022-05-25 DIAGNOSIS — Z720 Tobacco use: Secondary | ICD-10-CM

## 2022-05-25 DIAGNOSIS — G8929 Other chronic pain: Secondary | ICD-10-CM

## 2022-05-25 DIAGNOSIS — R739 Hyperglycemia, unspecified: Secondary | ICD-10-CM | POA: Diagnosis not present

## 2022-05-25 DIAGNOSIS — K529 Noninfective gastroenteritis and colitis, unspecified: Secondary | ICD-10-CM | POA: Diagnosis not present

## 2022-05-25 DIAGNOSIS — D509 Iron deficiency anemia, unspecified: Secondary | ICD-10-CM | POA: Diagnosis not present

## 2022-05-25 DIAGNOSIS — I10 Essential (primary) hypertension: Secondary | ICD-10-CM | POA: Diagnosis not present

## 2022-05-25 DIAGNOSIS — M8580 Other specified disorders of bone density and structure, unspecified site: Secondary | ICD-10-CM

## 2022-05-25 DIAGNOSIS — E782 Mixed hyperlipidemia: Secondary | ICD-10-CM

## 2022-05-25 DIAGNOSIS — M5441 Lumbago with sciatica, right side: Secondary | ICD-10-CM

## 2022-05-25 DIAGNOSIS — Z6832 Body mass index (BMI) 32.0-32.9, adult: Secondary | ICD-10-CM

## 2022-05-25 DIAGNOSIS — K219 Gastro-esophageal reflux disease without esophagitis: Secondary | ICD-10-CM | POA: Diagnosis not present

## 2022-05-25 DIAGNOSIS — E8881 Metabolic syndrome: Secondary | ICD-10-CM | POA: Diagnosis not present

## 2022-05-25 DIAGNOSIS — E039 Hypothyroidism, unspecified: Secondary | ICD-10-CM

## 2022-05-25 DIAGNOSIS — F330 Major depressive disorder, recurrent, mild: Secondary | ICD-10-CM

## 2022-05-25 LAB — BAYER DCA HB A1C WAIVED: HB A1C (BAYER DCA - WAIVED): 5.6 % (ref 4.8–5.6)

## 2022-05-25 LAB — LIPID PANEL

## 2022-05-25 LAB — CBC WITH DIFFERENTIAL/PLATELET

## 2022-05-25 MED ORDER — AMLODIPINE BESYLATE 10 MG PO TABS: 10.0000 mg | ORAL_TABLET | Freq: Every day | ORAL | 1 refills | Status: DC

## 2022-05-25 MED ORDER — LEVOTHYROXINE SODIUM 75 MCG PO TABS: 75.0000 ug | ORAL_TABLET | Freq: Every day | ORAL | 1 refills | Status: DC

## 2022-05-25 MED ORDER — ATORVASTATIN CALCIUM 80 MG PO TABS
80.0000 mg | ORAL_TABLET | Freq: Every day | ORAL | 1 refills | Status: AC
Start: 2022-05-25 — End: ?

## 2022-05-25 MED ORDER — FENOFIBRATE 134 MG PO CAPS: ORAL_CAPSULE | ORAL | 1 refills | Status: DC

## 2022-05-25 MED ORDER — FERROUS SULFATE 325 (65 FE) MG PO TABS: 325.0000 mg | ORAL_TABLET | Freq: Every day | ORAL | 1 refills | Status: DC

## 2022-05-25 MED ORDER — MONTELUKAST SODIUM 10 MG PO TABS
10.0000 mg | ORAL_TABLET | Freq: Every day | ORAL | 1 refills | Status: AC
Start: 2022-05-25 — End: ?

## 2022-05-25 MED ORDER — TIZANIDINE HCL 4 MG PO TABS: 4.0000 mg | ORAL_TABLET | Freq: Every day | ORAL | 1 refills | Status: DC

## 2022-05-25 MED ORDER — OMEPRAZOLE 40 MG PO CPDR: 40.0000 mg | DELAYED_RELEASE_CAPSULE | Freq: Every day | ORAL | 1 refills | Status: DC

## 2022-05-25 MED ORDER — BUDESONIDE-FORMOTEROL FUMARATE 160-4.5 MCG/ACT IN AERO: INHALATION_SPRAY | RESPIRATORY_TRACT | 1 refills | Status: DC
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
## 2022-05-26 ENCOUNTER — Telehealth: Payer: Self-pay | Admitting: Nurse Practitioner

## 2022-05-26 LAB — CBC WITH DIFFERENTIAL/PLATELET
Basophils Absolute: 0.1 10*3/uL (ref 0.0–0.2)
Basos: 1 %
EOS (ABSOLUTE): 0.2 10*3/uL (ref 0.0–0.4)
Eos: 1 %
Hematocrit: 42.4 % (ref 34.0–46.6)
Hemoglobin: 13.9 g/dL (ref 11.1–15.9)
Immature Grans (Abs): 0 10*3/uL (ref 0.0–0.1)
Immature Granulocytes: 0 %
Lymphocytes Absolute: 4.8 10*3/uL — ABNORMAL HIGH (ref 0.7–3.1)
Lymphs: 40 %
MCH: 29.3 pg (ref 26.6–33.0)
MCHC: 32.8 g/dL (ref 31.5–35.7)
MCV: 89 fL (ref 79–97)
Monocytes Absolute: 0.7 10*3/uL (ref 0.1–0.9)
Monocytes: 6 %
Neutrophils Absolute: 6.2 10*3/uL (ref 1.4–7.0)
Neutrophils: 52 %
Platelets: 443 10*3/uL (ref 150–450)
RBC: 4.75 x10E6/uL (ref 3.77–5.28)
RDW: 12.9 % (ref 11.7–15.4)
WBC: 12 10*3/uL — ABNORMAL HIGH (ref 3.4–10.8)

## 2022-05-26 LAB — CMP14+EGFR
ALT: 11 IU/L (ref 0–32)
AST: 12 IU/L (ref 0–40)
Albumin/Globulin Ratio: 2.3 — ABNORMAL HIGH (ref 1.2–2.2)
Albumin: 4.6 g/dL (ref 3.8–4.8)
Alkaline Phosphatase: 57 IU/L (ref 44–121)
BUN/Creatinine Ratio: 21 (ref 12–28)
BUN: 13 mg/dL (ref 8–27)
Bilirubin Total: 0.4 mg/dL (ref 0.0–1.2)
CO2: 24 mmol/L (ref 20–29)
Calcium: 10.1 mg/dL (ref 8.7–10.3)
Chloride: 103 mmol/L (ref 96–106)
Creatinine, Ser: 0.62 mg/dL (ref 0.57–1.00)
Globulin, Total: 2 g/dL (ref 1.5–4.5)
Glucose: 98 mg/dL (ref 70–99)
Potassium: 4.6 mmol/L (ref 3.5–5.2)
Sodium: 141 mmol/L (ref 134–144)
Total Protein: 6.6 g/dL (ref 6.0–8.5)
eGFR: 95 mL/min/{1.73_m2} (ref >59)

## 2022-05-26 LAB — THYROID PANEL WITH TSH
Free Thyroxine Index: 2.6 (ref 1.2–4.9)
T3 Uptake Ratio: 27 % (ref 24–39)
T4, Total: 9.6 ug/dL (ref 4.5–12.0)
TSH: 2.42 u[IU]/mL (ref 0.450–4.500)

## 2022-05-26 LAB — LIPID PANEL
Chol/HDL Ratio: 2.7 ratio (ref 0.0–4.4)
Cholesterol, Total: 138 mg/dL (ref 100–199)
HDL: 51 mg/dL (ref >39)
LDL Chol Calc (NIH): 68 mg/dL (ref 0–99)
Triglycerides: 103 mg/dL (ref 0–149)
VLDL Cholesterol Cal: 19 mg/dL (ref 5–40)

## 2022-05-26 NOTE — Telephone Encounter (Signed)
Spoke with patient and she wants to continue iron. Looks like MMM sent to pharmacy when patient was here for appt. Patient notified and verbalized understanding

## 2022-05-26 NOTE — Telephone Encounter (Signed)
Please advise 

## 2022-05-26 NOTE — Telephone Encounter (Signed)
Hgb is good. It is up ti her wether she wants to continue it or not

## 2022-05-26 NOTE — Telephone Encounter (Signed)
Patient calling to see if she needs to continue taking iron pills. Please call back

## 2022-06-10 ENCOUNTER — Ambulatory Visit (INDEPENDENT_AMBULATORY_CARE_PROVIDER_SITE_OTHER): Payer: Medicare Other

## 2022-06-10 DIAGNOSIS — M9901 Segmental and somatic dysfunction of cervical region: Secondary | ICD-10-CM | POA: Diagnosis not present

## 2022-06-10 DIAGNOSIS — Z780 Asymptomatic menopausal state: Secondary | ICD-10-CM | POA: Diagnosis not present

## 2022-06-10 DIAGNOSIS — M5137 Other intervertebral disc degeneration, lumbosacral region: Secondary | ICD-10-CM | POA: Diagnosis not present

## 2022-06-10 DIAGNOSIS — M9902 Segmental and somatic dysfunction of thoracic region: Secondary | ICD-10-CM | POA: Diagnosis not present

## 2022-06-10 DIAGNOSIS — M9903 Segmental and somatic dysfunction of lumbar region: Secondary | ICD-10-CM | POA: Diagnosis not present

## 2022-06-18 NOTE — Progress Notes (Signed)
Attempted to call patient to schedule follow-up LDCT. Unable to reach the patient at this time, detailed VM left asking that the patient return my call.

## 2022-07-08 DIAGNOSIS — M9901 Segmental and somatic dysfunction of cervical region: Secondary | ICD-10-CM | POA: Diagnosis not present

## 2022-07-08 DIAGNOSIS — M9903 Segmental and somatic dysfunction of lumbar region: Secondary | ICD-10-CM | POA: Diagnosis not present

## 2022-07-08 DIAGNOSIS — M9902 Segmental and somatic dysfunction of thoracic region: Secondary | ICD-10-CM | POA: Diagnosis not present

## 2022-07-08 DIAGNOSIS — M5137 Other intervertebral disc degeneration, lumbosacral region: Secondary | ICD-10-CM | POA: Diagnosis not present

## 2022-07-23 ENCOUNTER — Telehealth: Payer: Self-pay

## 2022-07-23 NOTE — Telephone Encounter (Signed)
Spoke with the patient regarding LDCT.  Patient stated [facility] is scheduling the scan.  Patient asked to please let the CC know of date so we can look for the scan.  Patient verbalized understanding.

## 2022-07-23 NOTE — Telephone Encounter (Signed)
-----   Message from Harriett Rush, [place] sent at 07/22/2022  7:18 PM EDT ----- She is due for follow-up LDCT chest, since she had some suspicious findings on CT scan from December 2023.  I see the [facility] called in February, but unable to connect with patient.

## 2022-08-05 DIAGNOSIS — M9901 Segmental and somatic dysfunction of cervical region: Secondary | ICD-10-CM | POA: Diagnosis not present

## 2022-08-05 DIAGNOSIS — M5137 Other intervertebral disc degeneration, lumbosacral region: Secondary | ICD-10-CM | POA: Diagnosis not present

## 2022-08-05 DIAGNOSIS — M9902 Segmental and somatic dysfunction of thoracic region: Secondary | ICD-10-CM | POA: Diagnosis not present

## 2022-08-05 DIAGNOSIS — M9903 Segmental and somatic dysfunction of lumbar region: Secondary | ICD-10-CM | POA: Diagnosis not present

## 2022-08-09 ENCOUNTER — Other Ambulatory Visit: Payer: Self-pay | Admitting: Nurse Practitioner

## 2022-08-19 NOTE — Progress Notes (Signed)
Patient is past due for annual LDCT. Multiple telephone attempts made to reach the patient to schedule LDCT. Reminder letter sent today. Referral to be closed at this time due to multiple unsuccessful attempts to reach patient.  

## 2022-09-02 DIAGNOSIS — M9903 Segmental and somatic dysfunction of lumbar region: Secondary | ICD-10-CM | POA: Diagnosis not present

## 2022-09-02 DIAGNOSIS — M9901 Segmental and somatic dysfunction of cervical region: Secondary | ICD-10-CM | POA: Diagnosis not present

## 2022-09-02 DIAGNOSIS — M9902 Segmental and somatic dysfunction of thoracic region: Secondary | ICD-10-CM | POA: Diagnosis not present

## 2022-09-02 DIAGNOSIS — M5137 Other intervertebral disc degeneration, lumbosacral region: Secondary | ICD-10-CM | POA: Diagnosis not present

## 2022-09-14 ENCOUNTER — Ambulatory Visit (INDEPENDENT_AMBULATORY_CARE_PROVIDER_SITE_OTHER): Payer: Medicare Other

## 2022-09-14 VITALS — Ht 62.0 in | Wt 130.0 lb

## 2022-09-14 DIAGNOSIS — Z0000 Encounter for general adult medical examination without abnormal findings: Secondary | ICD-10-CM

## 2022-09-14 DIAGNOSIS — Z1231 Encounter for screening mammogram for malignant neoplasm of breast: Secondary | ICD-10-CM
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
## 2022-09-14 NOTE — Patient Instructions (Signed)
[name] , Thank you for taking time to come for your Medicare Wellness Visit. I appreciate your ongoing commitment to your health goals. Please review the following plan we discussed and let me know if I can assist you in the future.   These are the goals we discussed:  Goals      DIET - INCREASE WATER INTAKE     Exercise 150 min/wk Moderate Activity     Walking is a great option.     Patient Stated     09/07/2019 AWV Goal: Keep All Scheduled Appointments  Over the next year, patient will attend all scheduled appointments with their PCP and any specialists that they see.         This is a list of the screening recommended for you and due dates:  Health Maintenance  Topic Date Due   Zoster (Shingles) Vaccine (1 of 2) Never done   COVID-19 Vaccine (2 - 2023-24 season) 01/02/2022   Mammogram  06/02/2022   Flu Shot  12/03/2022   Screening for Lung Cancer  04/18/2023   Medicare Annual Wellness Visit  09/14/2023   DEXA scan (bone density measurement)  06/10/2024   Colon Cancer Screening  11/06/2030   DTaP/Tdap/Td vaccine (3 - Td or Tdap) 05/23/2031   Pneumonia Vaccine  Completed   Hepatitis C Screening: USPSTF Recommendation to screen - Ages 51-79 yo.  Completed   HPV Vaccine  Aged Out   Cologuard (Stool DNA test)  Discontinued    Advanced directives: Advance directive discussed with you today. I have provided a copy for you to complete at home and have notarized. Once this is complete please bring a copy in to our office so we can scan it into your chart.   Conditions/risks identified: Aim for 30 minutes of exercise or brisk walking, 6-8 glasses of water, and 5 servings of fruits and vegetables each day.   Next appointment: Follow up in one year for your annual wellness visit    Preventive Care 65 Years and Older, Female Preventive care refers to lifestyle choices and visits with your health care provider that can promote health and wellness. What does preventive care  include? A yearly physical exam. This is also called an annual well check. Dental exams once or twice a year. Routine eye exams. Ask your health care provider how often you should have your eyes checked. Personal lifestyle choices, including: Daily care of your teeth and gums. Regular physical activity. Eating a healthy diet. Avoiding tobacco and drug use. Limiting alcohol use. Practicing safe sex. Taking low-dose aspirin every day. Taking vitamin and mineral supplements as recommended by your health care provider. What happens during an annual well check? The services and screenings done by your health care provider during your annual well check will depend on your age, overall health, lifestyle risk factors, and family history of disease. Counseling  Your health care provider may ask you questions about your: Alcohol use. Tobacco use. Drug use. Emotional well-being. Home and relationship well-being. Sexual activity. Eating habits. History of falls. Memory and ability to understand (cognition). Work and work Astronomer. Reproductive health. Screening  You may have the following tests or measurements: Height, weight, and BMI. Blood pressure. Lipid and cholesterol levels. These may be checked every 5 years, or more frequently if you are over 72 years old. Skin check. Lung cancer screening. You may have this screening every year starting at age 6 if you have a 30-pack-year history of smoking and currently smoke or  have quit within the past 15 years. Fecal occult blood test (FOBT) of the stool. You may have this test every year starting at age 23. Flexible sigmoidoscopy or colonoscopy. You may have a sigmoidoscopy every 5 years or a colonoscopy every 10 years starting at age 22. Hepatitis C blood test. Hepatitis B blood test. Sexually transmitted disease (STD) testing. Diabetes screening. This is done by checking your blood sugar (glucose) after you have not eaten for a while  (fasting). You may have this done every 1-3 years. Bone density scan. This is done to screen for osteoporosis. You may have this done starting at age 35. Mammogram. This may be done every 1-2 years. Talk to your health care provider about how often you should have regular mammograms. Talk with your health care provider about your test results, treatment options, and if necessary, the need for more tests. Vaccines  Your health care provider may recommend certain vaccines, such as: Influenza vaccine. This is recommended every year. Tetanus, diphtheria, and acellular pertussis (Tdap, Td) vaccine. You may need a Td booster every 10 years. Zoster vaccine. You may need this after age 31. Pneumococcal 13-valent conjugate (PCV13) vaccine. One dose is recommended after age 39. Pneumococcal polysaccharide (PPSV23) vaccine. One dose is recommended after age 38. Talk to your health care provider about which screenings and vaccines you need and how often you need them. This information is not intended to replace advice given to you by your health care provider. Make sure you discuss any questions you have with your health care provider. Document Released: 05/17/2015 Document Revised: 01/08/2016 Document Reviewed: 02/19/2015 Elsevier Interactive Patient Education  2017 [employer].  Fall Prevention in the Home Falls can cause injuries. They can happen to people of all ages. There are many things you can do to make your home safe and to help prevent falls. What can I do on the outside of my home? Regularly fix the edges of walkways and driveways and fix any cracks. Remove anything that might make you trip as you walk through a door, such as a raised step or threshold. Trim any bushes or trees on the path to your home. Use bright outdoor lighting. Clear any walking paths of anything that might make someone trip, such as rocks or tools. Regularly check to see if handrails are loose or broken. Make sure that  both sides of any steps have handrails. Any raised decks and porches should have guardrails on the edges. Have any leaves, snow, or ice cleared regularly. Use sand or salt on walking paths during winter. Clean up any spills in your garage right away. This includes oil or grease spills. What can I do in the bathroom? Use night lights. Install grab bars by the toilet and in the tub and shower. Do not use towel bars as grab bars. Use non-skid mats or decals in the tub or shower. If you need to sit down in the shower, use a plastic, non-slip stool. Keep the floor dry. Clean up any water that spills on the floor as soon as it happens. Remove soap buildup in the tub or shower regularly. Attach bath mats securely with double-sided non-slip rug tape. Do not have throw rugs and other things on the floor that can make you trip. What can I do in the bedroom? Use night lights. Make sure that you have a light by your bed that is easy to reach. Do not use any sheets or blankets that are too big for your  bed. They should not hang down onto the floor. Have a firm chair that has side arms. You can use this for support while you get dressed. Do not have throw rugs and other things on the floor that can make you trip. What can I do in the kitchen? Clean up any spills right away. Avoid walking on wet floors. Keep items that you use a lot in easy-to-reach places. If you need to reach something above you, use a strong step stool that has a grab bar. Keep electrical cords out of the way. Do not use floor polish or wax that makes floors slippery. If you must use wax, use non-skid floor wax. Do not have throw rugs and other things on the floor that can make you trip. What can I do with my stairs? Do not leave any items on the stairs. Make sure that there are handrails on both sides of the stairs and use them. Fix handrails that are broken or loose. Make sure that handrails are as long as the stairways. Check  any carpeting to make sure that it is firmly attached to the stairs. Fix any carpet that is loose or worn. Avoid having throw rugs at the top or bottom of the stairs. If you do have throw rugs, attach them to the floor with carpet tape. Make sure that you have a light switch at the top of the stairs and the bottom of the stairs. If you do not have them, ask someone to add them for you. What else can I do to help prevent falls? Wear shoes that: Do not have high heels. Have rubber bottoms. Are comfortable and fit you well. Are closed at the toe. Do not wear sandals. If you use a stepladder: Make sure that it is fully opened. Do not climb a closed stepladder. Make sure that both sides of the stepladder are locked into place. Ask someone to hold it for you, if possible. Clearly mark and make sure that you can see: Any grab bars or handrails. First and last steps. Where the edge of each step is. Use tools that help you move around (mobility aids) if they are needed. These include: Canes. Walkers. Scooters. Crutches. Turn on the lights when you go into a dark area. Replace any light bulbs as soon as they burn out. Set up your furniture so you have a clear path. Avoid moving your furniture around. If any of your floors are uneven, fix them. If there are any pets around you, be aware of where they are. Review your medicines with your doctor. Some medicines can make you feel dizzy. This can increase your chance of falling. Ask your doctor what other things that you can do to help prevent falls. This information is not intended to replace advice given to you by your health care provider. Make sure you discuss any questions you have with your health care provider. Document Released: 02/14/2009 Document Revised: 09/26/2015 Document Reviewed: 05/25/2014 Elsevier Interactive Patient Education  2017 [employer].

## 2022-09-30 ENCOUNTER — Ambulatory Visit
Admission: RE | Admit: 2022-09-30 | Discharge: 2022-09-30 | Disposition: A | Discharge status: Home / Self Care | Payer: Medicare Other | Source: Ambulatory Visit | Attending: Nurse Practitioner | Admitting: Nurse Practitioner

## 2022-09-30 DIAGNOSIS — M9901 Segmental and somatic dysfunction of cervical region: Secondary | ICD-10-CM | POA: Diagnosis not present

## 2022-09-30 DIAGNOSIS — M5137 Other intervertebral disc degeneration, lumbosacral region: Secondary | ICD-10-CM | POA: Diagnosis not present

## 2022-09-30 DIAGNOSIS — M9902 Segmental and somatic dysfunction of thoracic region: Secondary | ICD-10-CM | POA: Diagnosis not present

## 2022-09-30 DIAGNOSIS — Z1231 Encounter for screening mammogram for malignant neoplasm of breast: Secondary | ICD-10-CM

## 2022-09-30 DIAGNOSIS — M9903 Segmental and somatic dysfunction of lumbar region: Secondary | ICD-10-CM | POA: Diagnosis not present

## 2022-10-15 ENCOUNTER — Other Ambulatory Visit: Payer: Self-pay

## 2022-10-15 DIAGNOSIS — R911 Solitary pulmonary nodule: Secondary | ICD-10-CM

## 2022-10-28 DIAGNOSIS — M9902 Segmental and somatic dysfunction of thoracic region: Secondary | ICD-10-CM | POA: Diagnosis not present

## 2022-10-28 DIAGNOSIS — M9903 Segmental and somatic dysfunction of lumbar region: Secondary | ICD-10-CM | POA: Diagnosis not present

## 2022-10-28 DIAGNOSIS — M9901 Segmental and somatic dysfunction of cervical region: Secondary | ICD-10-CM | POA: Diagnosis not present

## 2022-10-28 DIAGNOSIS — M5137 Other intervertebral disc degeneration, lumbosacral region: Secondary | ICD-10-CM | POA: Diagnosis not present

## 2022-10-29 ENCOUNTER — Telehealth: Payer: Self-pay | Admitting: Nurse Practitioner

## 2022-10-29 NOTE — Telephone Encounter (Signed)
Called and spoke with patient. She is having chronic diarrhea and it has been going on for weeks. Wants to be seen asap. Appt scheduled for this Monday with MMM per patients request

## 2022-11-02 ENCOUNTER — Ambulatory Visit (INDEPENDENT_AMBULATORY_CARE_PROVIDER_SITE_OTHER): Payer: Medicare Other

## 2022-11-02 ENCOUNTER — Encounter: Payer: Self-pay | Admitting: Nurse Practitioner

## 2022-11-02 ENCOUNTER — Ambulatory Visit (INDEPENDENT_AMBULATORY_CARE_PROVIDER_SITE_OTHER): Payer: Medicare Other | Admitting: Nurse Practitioner

## 2022-11-02 VITALS — BP 112/67 | HR 74 | Temp 97.8°F | Resp 20 | Ht 62.0 in | Wt 129.0 lb

## 2022-11-02 DIAGNOSIS — K591 Functional diarrhea: Secondary | ICD-10-CM

## 2022-11-02 DIAGNOSIS — R197 Diarrhea, unspecified: Secondary | ICD-10-CM | POA: Diagnosis not present

## 2022-11-02 MED ORDER — AZITHROMYCIN 250 MG PO TABS
ORAL_TABLET | ORAL | 0 refills | Status: DC
Start: 2022-11-02 — End: 2022-11-19

## 2022-11-02 NOTE — Progress Notes (Signed)
Subjective:    Patient ID: Karla Maxwell, female    DOB: Jun 08, 1950, 72 y.o.   MRN: [id number]   Chief Complaint: diarrhea for weeks   HPI  Patient comes in c/o diarrhea for several weeks. Daily. Goes 5-6 x a day. Takes imodium some days. Helps some. But comes right back after imodium wears off. Had abdominal pain when she needs to go.   Patient Active Problem List   Diagnosis Date Noted   Chronic diarrhea 09/23/2020   Iron deficiency anemia 09/23/2020   COPD (chronic obstructive pulmonary disease) with chronic bronchitis 06/08/2018   Tobacco abuse 06/08/2018   Essential hypertension 06/08/2018   Metabolic syndrome 02/03/2018   Mild episode of recurrent major depressive disorder (HCC) 02/24/2016   BMI 34.0-34.9,adult 09/26/2014   Osteopenia 07/11/2014   Hyperlipidemia 08/27/2010   Hypothyroidism 08/27/2010   GERD (gastroesophageal reflux disease) 08/27/2010       Review of Systems  Constitutional:  Negative for diaphoresis.  Eyes:  Negative for pain.  Respiratory:  Negative for shortness of breath.   Cardiovascular:  Negative for chest pain, palpitations and leg swelling.  Gastrointestinal:  Negative for abdominal pain.  Endocrine: Negative for polydipsia.  Skin:  Negative for rash.  Neurological:  Negative for dizziness, weakness and headaches.  Hematological:  Does not bruise/bleed easily.  All other systems reviewed and are negative.      Objective:   Physical Exam Vitals and nursing note reviewed.  Constitutional:      General: She is not in acute distress.    Appearance: Normal appearance. She is well-developed.  Neck:     Vascular: No carotid bruit or JVD.  Cardiovascular:     Rate and Rhythm: Normal rate and regular rhythm.     Heart sounds: Normal heart sounds.  Pulmonary:     Effort: Pulmonary effort is normal. No respiratory distress.     Breath sounds: Normal breath sounds. No wheezing or rales.  Chest:     Chest wall: No tenderness.   Abdominal:     General: Bowel sounds are normal. There is no distension or abdominal bruit.     Palpations: Abdomen is soft. There is no hepatomegaly, splenomegaly, mass or pulsatile mass.     Tenderness: There is no abdominal tenderness.  Musculoskeletal:        General: Normal range of motion.     Cervical back: Normal range of motion and neck supple.  Lymphadenopathy:     Cervical: No cervical adenopathy.  Skin:    General: Skin is warm and dry.  Neurological:     Mental Status: She is alert and oriented to person, place, and time.     Deep Tendon Reflexes: Reflexes are normal and symmetric.  Psychiatric:        Behavior: Behavior normal.        Thought Content: Thought content normal.        Judgment: Judgment normal.    BP 112/67   Pulse 74   Temp 97.8 F (36.6 C) (Temporal)   Resp 20   Ht 5\' 2"  (1.575 m)   Wt 129 lb (58.5 kg)   SpO2 97%   BMI 23.59 kg/m   KUB- normal      Assessment & Plan:  AVAIAH GULICK in today with chief complaint of diarrhea for weeks   1. Functional diarrhea Do stool specimens before starting antibiotic. - DG Abd 1 View - Cdiff NAA+O+P+Stool Culture - azithromycin (ZITHROMAX Z-PAK) 250 MG tablet; 2  po at same time daily for 3 days  Dispense: 6 tablet; Refill: 0    The above assessment and management plan was discussed with the patient. The patient verbalized understanding of and has agreed to the management plan. Patient is aware to call the clinic if symptoms persist or worsen. Patient is aware when to return to the clinic for a follow-up visit. Patient educated on when it is appropriate to go to the emergency department.   Mary-Margaret Daphine Deutscher, FNP

## 2022-11-02 NOTE — Patient Instructions (Signed)
Diarrhea, Adult Diarrhea is frequent loose and sometimes watery bowel movements. Diarrhea can make you feel weak and cause you to become dehydrated. Dehydration is a condition in which there is not enough water or other fluids in the body. Dehydration can make you tired and thirsty, cause you to have a dry mouth, and decrease how often you urinate. Diarrhea typically lasts 2-3 days. However, it can last longer if it is a sign of something more serious. It is important to treat your diarrhea as told by your health care provider. Follow these instructions at home: Eating and drinking     Follow these recommendations as told by your health care provider: Take an oral rehydration solution (ORS). This is an over-the-counter medicine that helps return your body to its normal balance of nutrients and water. It is found at pharmacies and retail stores. Drink enough fluid to keep your urine pale yellow. Drink fluids such as water, diluted fruit juice, and low-calorie sports drinks. You can drink milk also, if desired. Sucking on ice chips is another way to get fluids. Avoid drinking fluids that contain a lot of sugar or caffeine, such as soda, energy drinks, and regular sports drinks. Avoid alcohol. Eat bland, easy-to-digest foods in small amounts as you are able. These foods include bananas, applesauce, rice, lean meats, toast, and crackers. Avoid spicy or fatty foods.  Medicines Take over-the-counter and prescription medicines only as told by your health care provider. If you were prescribed antibiotics, take them as told by your health care provider. Do not stop using the antibiotic even if you start to feel better. General instructions  Wash your hands often using soap and water for at least 20 seconds. If soap and water are not available, use hand sanitizer. Others in the household should wash their hands as well. Hands should be washed: After using the toilet or changing a diaper. Before  preparing, cooking, or serving food. While caring for a sick person or while visiting someone in a hospital. Rest at home while you recover. Take a warm bath to relieve any burning or pain from frequent diarrhea episodes. Watch your condition for any changes. Contact a health care provider if: You have a fever. Your diarrhea gets worse. You have new symptoms. You vomit every time you eat or drink. You feel light-headed, dizzy, or have a headache. You have muscle cramps. You have signs of dehydration, such as: Dark urine, very little urine, or no urine. Cracked lips. Dry mouth. Sunken eyes. Sleepiness. Weakness. You have bloody or black stools or stools that look like tar. You have severe pain, cramping, or bloating in your abdomen. Your skin feels cold and clammy. You feel confused. Get help right away if: You have chest pain or your heart is beating very quickly. You have trouble breathing or you are breathing very quickly. You feel extremely weak or you faint. These symptoms may be an emergency. Get help right away. Call 911. Do not wait to see if the symptoms will go away. Do not drive yourself to the hospital. This information is not intended to replace advice given to you by your health care provider. Make sure you discuss any questions you have with your health care provider. Document Revised: 10/07/2021 Document Reviewed: 10/07/2021 Elsevier Patient Education  2024 Elsevier Inc.  

## 2022-11-03 ENCOUNTER — Other Ambulatory Visit: Payer: Self-pay | Admitting: Nurse Practitioner

## 2022-11-03 ENCOUNTER — Other Ambulatory Visit: Payer: Medicare Other

## 2022-11-03 DIAGNOSIS — K591 Functional diarrhea: Secondary | ICD-10-CM | POA: Diagnosis not present

## 2022-11-04 LAB — CDIFF NAA+O+P+STOOL CULTURE: Toxigenic C. Difficile by PCR: NEGATIVE

## 2022-11-05 LAB — CDIFF NAA+O+P+STOOL CULTURE

## 2022-11-06 LAB — CDIFF NAA+O+P+STOOL CULTURE

## 2022-11-07 LAB — CDIFF NAA+O+P+STOOL CULTURE: Toxigenic C. Difficile by PCR: NEGATIVE

## 2022-11-12 ENCOUNTER — Telehealth: Payer: Self-pay | Admitting: Nurse Practitioner

## 2022-11-12 NOTE — Telephone Encounter (Signed)
Patient calling to check on results from stool samples. Please call back and advise.

## 2022-11-12 NOTE — Telephone Encounter (Signed)
You reviewed results. Will you please comment on labs and I will contact patient

## 2022-11-13 ENCOUNTER — Other Ambulatory Visit: Payer: Self-pay

## 2022-11-13 DIAGNOSIS — K591 Functional diarrhea: Secondary | ICD-10-CM

## 2022-11-13 MED ORDER — DIPHENOXYLATE-ATROPINE 2.5-0.025 MG PO TABS: 1.0000 | ORAL_TABLET | Freq: Four times a day (QID) | ORAL | 0 refills | Status: DC | PRN
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
## 2022-11-13 NOTE — Telephone Encounter (Signed)
Attempted to contact patient and line was busy.

## 2022-11-13 NOTE — Telephone Encounter (Signed)
Patient notified and med sent to pharmacy and urgent referral for GI has been placed. Patient verbalized understanding

## 2022-11-13 NOTE — Telephone Encounter (Signed)
Going  to send in prescription for lomotil. It is controlled so you cannot stay on it forever. Also needs GI referral- where would she like to o. Meds ordered this encounter  Medications   diphenoxylate-atropine (LOMOTIL) 2.5-0.025 MG tablet    Sig: Take 1 tablet by mouth 4 (four) times daily as needed for diarrhea or loose stools.    Dispense:  30 tablet    Refill:  0    Order Specific Question:   Supervising Provider    Answer:   Nils Pyle [[id number]]   Mary-Margaret Daphine Deutscher, FNP

## 2022-11-19 ENCOUNTER — Encounter (INDEPENDENT_AMBULATORY_CARE_PROVIDER_SITE_OTHER): Payer: Self-pay | Admitting: Gastroenterology

## 2022-11-19 ENCOUNTER — Ambulatory Visit (INDEPENDENT_AMBULATORY_CARE_PROVIDER_SITE_OTHER): Payer: Medicare Other | Admitting: Gastroenterology

## 2022-11-19 VITALS — BP 132/61 | HR 71 | Temp 97.7°F | Ht 62.0 in | Wt 127.1 lb

## 2022-11-19 DIAGNOSIS — R197 Diarrhea, unspecified: Secondary | ICD-10-CM | POA: Diagnosis not present
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
## 2022-11-19 NOTE — Patient Instructions (Signed)
I am going to check some labs and stool testing to further evaluate your diarrhea You can continue the lomotil for now, let me know if you need a refill Diarrhea could potentially be secondary to IBS, especially given your recent loss It is important to focus on stress management, I am also providing the low FODMAP diet which can help to avoid certain foods known to worsen diarrhea I will be in touch once I have the results of your testing  Follow up 3 months  It was a pleasure to see you today. I want to create trusting relationships with patients and provide genuine, compassionate, and quality care. I truly value your feedback! please be on the lookout for a survey regarding your visit with me today. I appreciate your input about our visit and your time in completing this!    Contessa Preuss L. Jeanmarie Hubert, MSN, APRN, AGNP-C Adult-Gerontology Nurse Practitioner [facility]

## 2022-11-20 LAB — C-REACTIVE PROTEIN: CRP: 10.1 mg/L — ABNORMAL HIGH (ref <8.0)

## 2022-11-23 ENCOUNTER — Ambulatory Visit: Payer: Medicare Other | Admitting: Nurse Practitioner

## 2022-11-23 DIAGNOSIS — R197 Diarrhea, unspecified: Secondary | ICD-10-CM | POA: Diagnosis not present

## 2022-11-24 ENCOUNTER — Other Ambulatory Visit: Payer: Self-pay | Admitting: Nurse Practitioner

## 2022-11-24 DIAGNOSIS — G8929 Other chronic pain: Secondary | ICD-10-CM

## 2022-11-25 DIAGNOSIS — M9903 Segmental and somatic dysfunction of lumbar region: Secondary | ICD-10-CM | POA: Diagnosis not present

## 2022-11-25 DIAGNOSIS — M9902 Segmental and somatic dysfunction of thoracic region: Secondary | ICD-10-CM | POA: Diagnosis not present

## 2022-11-25 DIAGNOSIS — M5137 Other intervertebral disc degeneration, lumbosacral region: Secondary | ICD-10-CM | POA: Diagnosis not present

## 2022-11-25 DIAGNOSIS — M9901 Segmental and somatic dysfunction of cervical region: Secondary | ICD-10-CM | POA: Diagnosis not present

## 2022-11-25 LAB — FECAL FAT, QUALITATIVE: FECAL FAT, QUALITATIVE: NORMAL

## 2022-11-26 ENCOUNTER — Ambulatory Visit (HOSPITAL_COMMUNITY): Payer: Medicare Other

## 2022-12-09 ENCOUNTER — Other Ambulatory Visit (INDEPENDENT_AMBULATORY_CARE_PROVIDER_SITE_OTHER): Payer: Self-pay | Admitting: Gastroenterology

## 2022-12-09 ENCOUNTER — Encounter (INDEPENDENT_AMBULATORY_CARE_PROVIDER_SITE_OTHER): Payer: Self-pay

## 2022-12-09 DIAGNOSIS — R634 Abnormal weight loss: Secondary | ICD-10-CM

## 2022-12-09 DIAGNOSIS — R1012 Left upper quadrant pain: Secondary | ICD-10-CM

## 2022-12-09 DIAGNOSIS — R197 Diarrhea, unspecified: Secondary | ICD-10-CM

## 2022-12-10 ENCOUNTER — Ambulatory Visit (HOSPITAL_COMMUNITY)
Admission: RE | Admit: 2022-12-10 | Discharge: 2022-12-10 | Disposition: A | Discharge status: Home / Self Care | Payer: Medicare Other | Source: Ambulatory Visit | Attending: Nurse Practitioner | Admitting: Nurse Practitioner

## 2022-12-10 DIAGNOSIS — R911 Solitary pulmonary nodule: Secondary | ICD-10-CM | POA: Diagnosis not present

## 2022-12-10 DIAGNOSIS — J432 Centrilobular emphysema: Secondary | ICD-10-CM | POA: Diagnosis not present

## 2022-12-10 DIAGNOSIS — R918 Other nonspecific abnormal finding of lung field: Secondary | ICD-10-CM | POA: Diagnosis not present

## 2022-12-13 ENCOUNTER — Other Ambulatory Visit (HOSPITAL_BASED_OUTPATIENT_CLINIC_OR_DEPARTMENT_OTHER): Payer: Medicare Other

## 2022-12-14 DIAGNOSIS — R197 Diarrhea, unspecified: Secondary | ICD-10-CM | POA: Diagnosis not present

## 2022-12-14 DIAGNOSIS — R634 Abnormal weight loss: Secondary | ICD-10-CM | POA: Diagnosis not present

## 2022-12-15 ENCOUNTER — Other Ambulatory Visit (INDEPENDENT_AMBULATORY_CARE_PROVIDER_SITE_OTHER): Payer: Self-pay | Admitting: Gastroenterology

## 2022-12-15 ENCOUNTER — Telehealth (INDEPENDENT_AMBULATORY_CARE_PROVIDER_SITE_OTHER): Payer: Self-pay

## 2022-12-15 ENCOUNTER — Other Ambulatory Visit: Payer: Self-pay | Admitting: Nurse Practitioner

## 2022-12-15 DIAGNOSIS — R197 Diarrhea, unspecified: Secondary | ICD-10-CM

## 2022-12-16 ENCOUNTER — Telehealth: Payer: Self-pay | Admitting: Nurse Practitioner

## 2022-12-16 ENCOUNTER — Other Ambulatory Visit (INDEPENDENT_AMBULATORY_CARE_PROVIDER_SITE_OTHER): Payer: Self-pay | Admitting: Gastroenterology

## 2022-12-16 DIAGNOSIS — K580 Irritable bowel syndrome with diarrhea: Secondary | ICD-10-CM

## 2022-12-16 MED ORDER — DIPHENOXYLATE-ATROPINE 2.5-0.025 MG PO TABS
1.0000 | ORAL_TABLET | Freq: Four times a day (QID) | ORAL | 0 refills | Status: DC | PRN
Start: 2022-12-16 — End: 2023-03-02

## 2022-12-16 NOTE — Telephone Encounter (Signed)
Medication sent to pharmacy  

## 2022-12-16 NOTE — Telephone Encounter (Signed)
Will defer to PCP's return tomorrow.

## 2022-12-16 NOTE — Telephone Encounter (Signed)
Gabriel with [facility] calling in report on patients CT of Chest. Patient has a "Suspicious Left Upper Lobe solid nodule- increased in size.  Recommend f/u with pulmonary / thoracic surgery.

## 2022-12-17 ENCOUNTER — Other Ambulatory Visit: Payer: Self-pay | Admitting: Nurse Practitioner

## 2022-12-17 DIAGNOSIS — R911 Solitary pulmonary nodule: Secondary | ICD-10-CM

## 2022-12-17 NOTE — Telephone Encounter (Signed)
Pulmonary  nodule increasing in size. Needs referral with thoracic surgeon- will do referral.

## 2022-12-17 NOTE — Telephone Encounter (Signed)
Attempted to contact patient. No answer. 

## 2022-12-18 ENCOUNTER — Encounter: Payer: Self-pay | Admitting: Internal Medicine

## 2022-12-23 DIAGNOSIS — M9901 Segmental and somatic dysfunction of cervical region: Secondary | ICD-10-CM | POA: Diagnosis not present

## 2022-12-23 DIAGNOSIS — M9903 Segmental and somatic dysfunction of lumbar region: Secondary | ICD-10-CM | POA: Diagnosis not present

## 2022-12-23 DIAGNOSIS — M9902 Segmental and somatic dysfunction of thoracic region: Secondary | ICD-10-CM | POA: Diagnosis not present

## 2022-12-23 DIAGNOSIS — M5137 Other intervertebral disc degeneration, lumbosacral region: Secondary | ICD-10-CM | POA: Diagnosis not present

## 2022-12-25 ENCOUNTER — Telehealth: Payer: Self-pay | Admitting: Nurse Practitioner

## 2022-12-28 ENCOUNTER — Other Ambulatory Visit: Payer: Self-pay | Admitting: Nurse Practitioner

## 2022-12-28 DIAGNOSIS — R911 Solitary pulmonary nodule: Secondary | ICD-10-CM

## 2022-12-29 NOTE — Telephone Encounter (Signed)
Patient notified by provider. Referral redone

## 2023-01-11 ENCOUNTER — Encounter: Payer: Self-pay | Admitting: Pulmonary Disease

## 2023-01-18 ENCOUNTER — Telehealth: Payer: Self-pay | Admitting: Nurse Practitioner

## 2023-01-18 NOTE — Telephone Encounter (Signed)
Yes she needs to see them about her  lungs

## 2023-01-18 NOTE — Telephone Encounter (Signed)
Called and notified patient - she states that she will call and schedule appointment

## 2023-01-19 DIAGNOSIS — M9901 Segmental and somatic dysfunction of cervical region: Secondary | ICD-10-CM | POA: Diagnosis not present

## 2023-01-19 DIAGNOSIS — M9903 Segmental and somatic dysfunction of lumbar region: Secondary | ICD-10-CM | POA: Diagnosis not present

## 2023-01-19 DIAGNOSIS — M5137 Other intervertebral disc degeneration, lumbosacral region: Secondary | ICD-10-CM | POA: Diagnosis not present

## 2023-01-19 DIAGNOSIS — M9902 Segmental and somatic dysfunction of thoracic region: Secondary | ICD-10-CM | POA: Diagnosis not present

## 2023-01-20 ENCOUNTER — Ambulatory Visit (HOSPITAL_COMMUNITY)
Admission: RE | Admit: 2023-01-20 | Discharge: 2023-01-20 | Disposition: A | Discharge status: Home / Self Care | Payer: Medicare Other | Source: Ambulatory Visit | Attending: Gastroenterology | Admitting: Gastroenterology

## 2023-01-20 DIAGNOSIS — R197 Diarrhea, unspecified: Secondary | ICD-10-CM | POA: Insufficient documentation

## 2023-01-20 DIAGNOSIS — R1010 Upper abdominal pain, unspecified: Secondary | ICD-10-CM | POA: Diagnosis not present

## 2023-01-20 DIAGNOSIS — R634 Abnormal weight loss: Secondary | ICD-10-CM | POA: Diagnosis not present

## 2023-01-20 DIAGNOSIS — R1012 Left upper quadrant pain: Secondary | ICD-10-CM | POA: Diagnosis not present

## 2023-01-20 DIAGNOSIS — K5730 Diverticulosis of large intestine without perforation or abscess without bleeding: Secondary | ICD-10-CM | POA: Diagnosis not present

## 2023-01-20 MED ORDER — IOHEXOL 300 MG/ML  SOLN
80.0000 mL | Freq: Once | INTRAMUSCULAR | Status: AC | PRN
  Administered 2023-01-20: 80 mL via INTRAVENOUS

## 2023-01-27 ENCOUNTER — Institutional Professional Consult (permissible substitution): Payer: Medicare Other | Admitting: Pulmonary Disease

## 2023-01-31 IMAGING — CT CT CHEST LUNG CANCER SCREENING LOW DOSE W/O CM
1 of 2 series · 10 of 20 positions shown, 13 images · non-contrast
Comparison: None.

CLINICAL DATA: Current smoker, 53 pack-year history.

EXAM:
CT CHEST WITHOUT CONTRAST LOW-DOSE FOR LUNG CANCER SCREENING
TECHNIQUE: Multidetector CT imaging of the chest was performed following the
standard protocol without IV contrast.

[ct lung segmentation data · axial · 0.63mm/px · z∈[+1186,+1186]mm · 10 of 280 frames shown]
[frame 1/280  mediastinal]
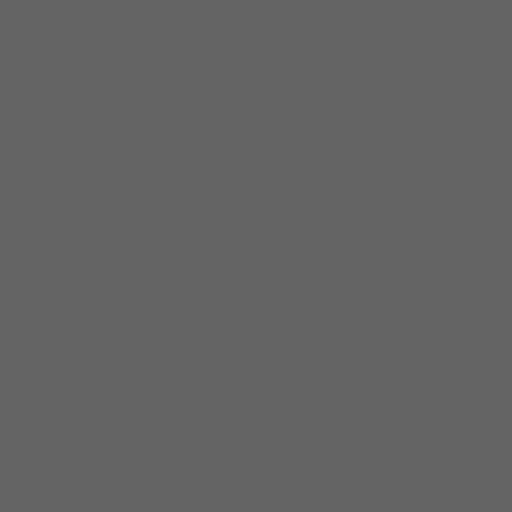
[frame 1/280  lung]
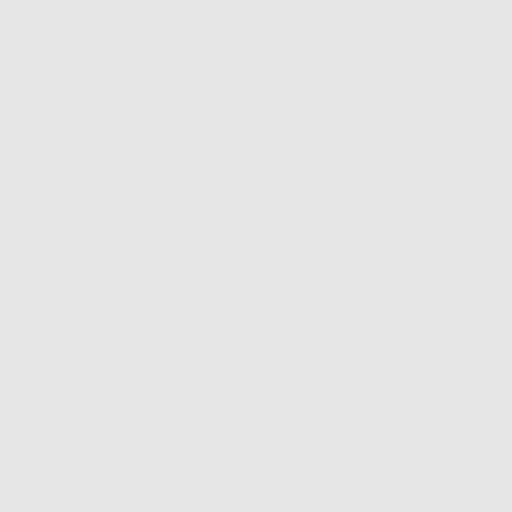
[frame 32/280  lung]
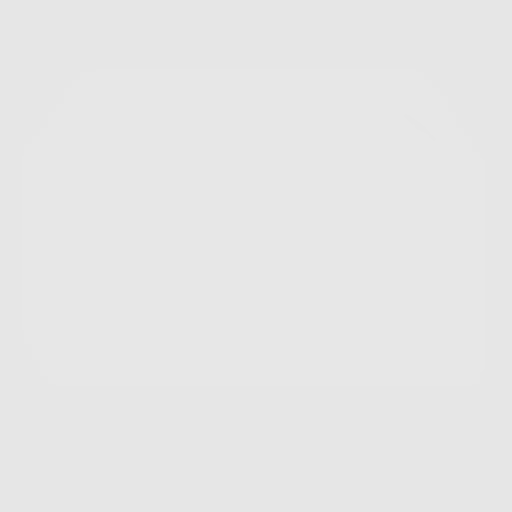
[frame 63/280  lung]
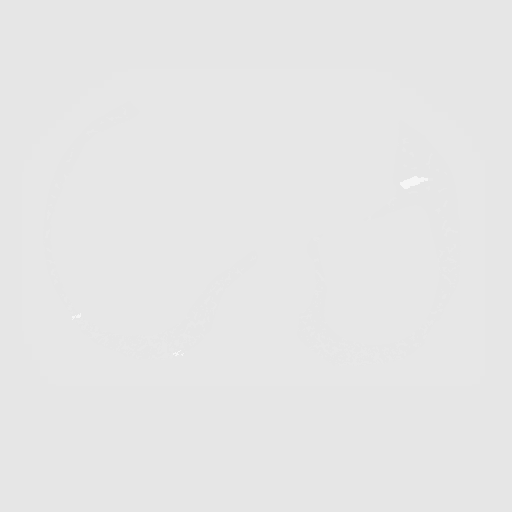
[frame 94/280  lung]
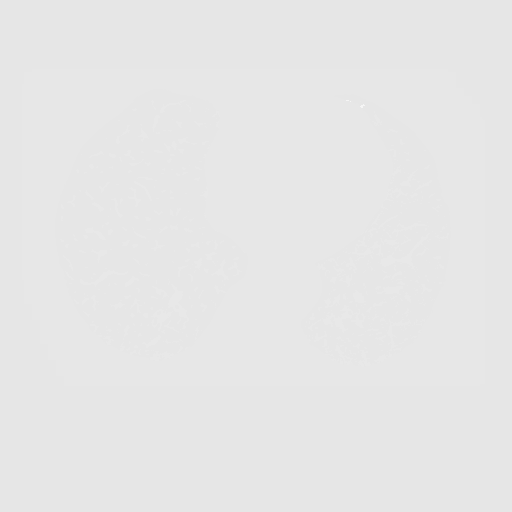
[frame 125/280  mediastinal]
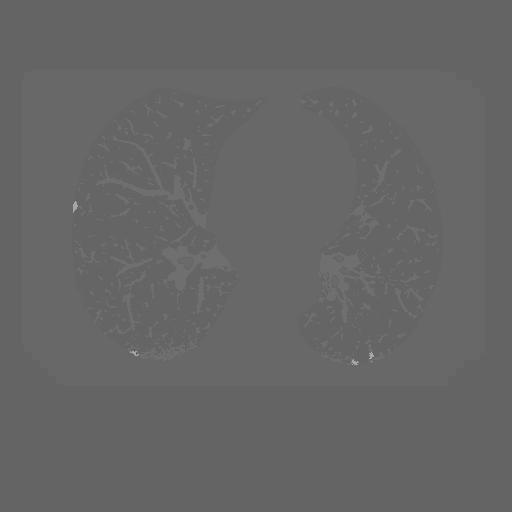
[frame 125/280  lung]
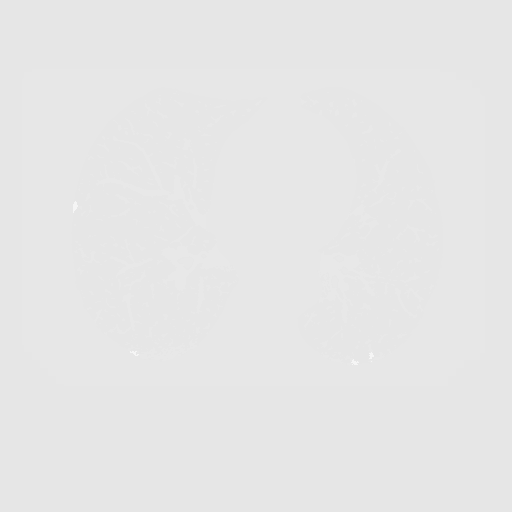
[frame 156/280  lung]
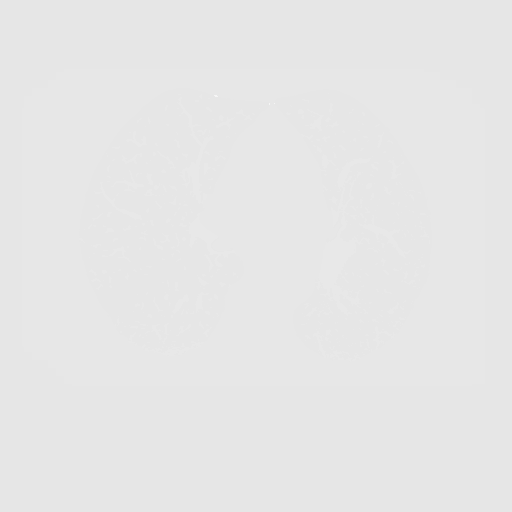
[frame 187/280  lung]
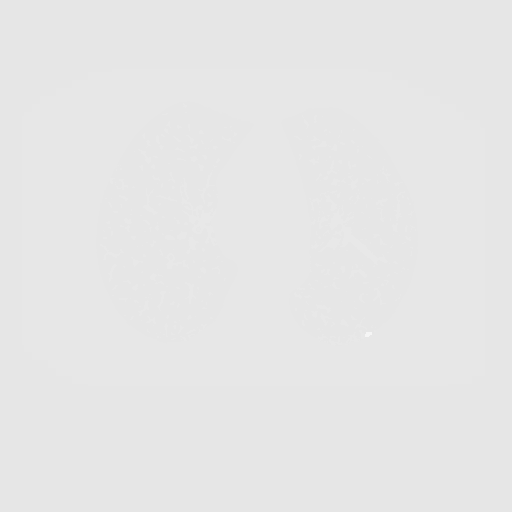
[frame 218/280  lung]
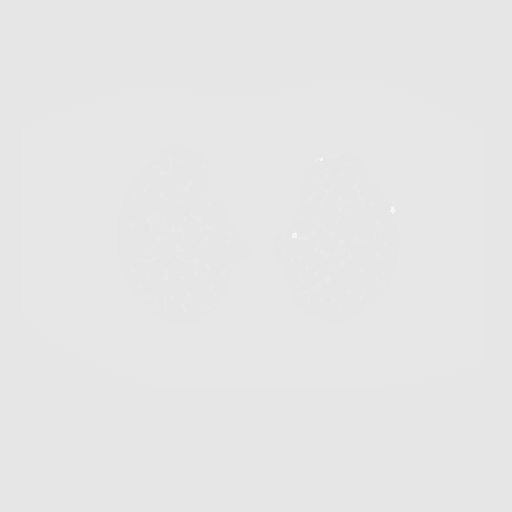
[frame 249/280  mediastinal]
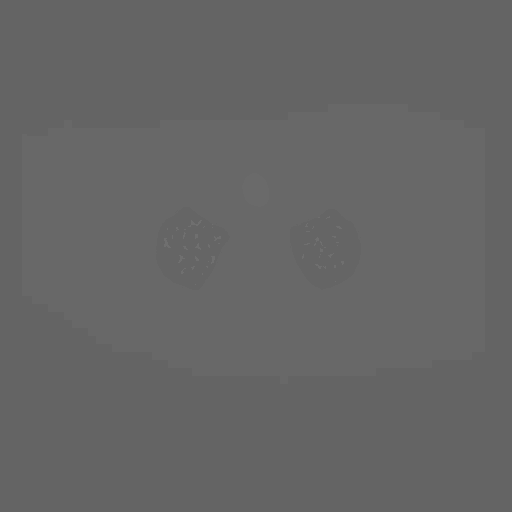
[frame 249/280  lung]
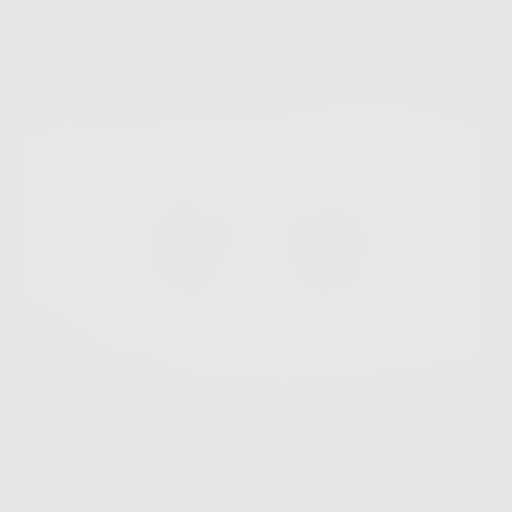
[frame 280/280  lung]
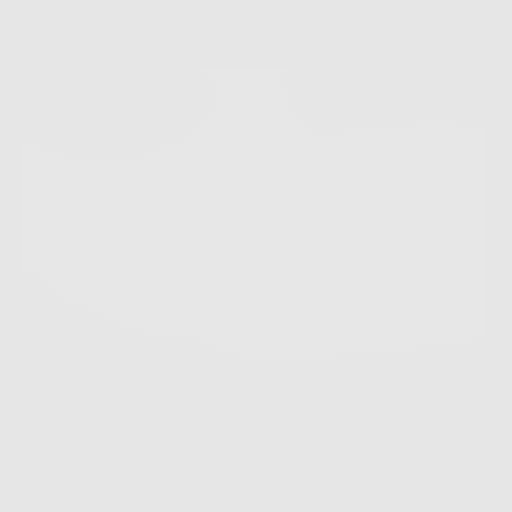

[10 of 20 positions shown; findings below may reference images not displayed]

FINDINGS: Cardiovascular: Atherosclerotic calcification of the aorta, aortic
valve and coronary arteries. Heart size normal. No pericardial
effusion.

Mediastinum/Nodes: No pathologically enlarged mediastinal or
axillary lymph nodes. Hilar regions are difficult to definitively
evaluate without IV contrast. Esophagus is grossly unremarkable.

Lungs/Pleura: Centrilobular emphysema. Smoking related respiratory
bronchiolitis. Pulmonary nodules measure 5.2 mm or less in size. No
pleural fluid. Airway is unremarkable.

Upper Abdomen: Visualized portions of the liver and adrenal glands
are unremarkable. Tiny right renal stone. Visualized portions of the
kidneys, spleen, pancreas and stomach are grossly unremarkable.

Musculoskeletal: Degenerative changes in the spine. No worrisome
lytic or sclerotic lesions.
IMPRESSION: 1. Lung-RADS 2, benign appearance or behavior. Continue annual
screening with low-dose chest CT without contrast in 12 months.
2. Tiny right renal stone.
3. Aortic atherosclerosis (9LLXK-F98.8). Coronary artery
calcification.
4.  Emphysema ([id number]-B9G.6).

## 2023-02-02 ENCOUNTER — Other Ambulatory Visit: Payer: Self-pay | Admitting: Nurse Practitioner

## 2023-02-02 DIAGNOSIS — K219 Gastro-esophageal reflux disease without esophagitis: Secondary | ICD-10-CM

## 2023-02-02 DIAGNOSIS — E039 Hypothyroidism, unspecified: Secondary | ICD-10-CM

## 2023-02-02 DIAGNOSIS — J4489 Other specified chronic obstructive pulmonary disease: Secondary | ICD-10-CM

## 2023-02-02 DIAGNOSIS — I10 Essential (primary) hypertension: Secondary | ICD-10-CM

## 2023-02-02 DIAGNOSIS — E782 Mixed hyperlipidemia: Secondary | ICD-10-CM

## 2023-02-09 ENCOUNTER — Encounter (INDEPENDENT_AMBULATORY_CARE_PROVIDER_SITE_OTHER): Payer: Self-pay

## 2023-02-09 ENCOUNTER — Other Ambulatory Visit (INDEPENDENT_AMBULATORY_CARE_PROVIDER_SITE_OTHER): Payer: Self-pay | Admitting: Gastroenterology

## 2023-02-09 DIAGNOSIS — R197 Diarrhea, unspecified: Secondary | ICD-10-CM

## 2023-02-09 DIAGNOSIS — R1030 Lower abdominal pain, unspecified: Secondary | ICD-10-CM

## 2023-02-09 DIAGNOSIS — R634 Abnormal weight loss: Secondary | ICD-10-CM

## 2023-02-19 ENCOUNTER — Encounter: Payer: Self-pay | Admitting: Pulmonary Disease

## 2023-02-19 ENCOUNTER — Ambulatory Visit: Payer: Medicare Other | Admitting: Pulmonary Disease

## 2023-02-19 VITALS — BP 138/60 | HR 69 | Temp 98.2°F | Ht 61.5 in | Wt 122.8 lb

## 2023-02-19 DIAGNOSIS — Z720 Tobacco use: Secondary | ICD-10-CM | POA: Diagnosis not present

## 2023-02-19 DIAGNOSIS — R911 Solitary pulmonary nodule: Secondary | ICD-10-CM
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
## 2023-02-19 NOTE — Patient Instructions (Signed)
Thank you for visiting Dr. Tonia Brooms at [facility]. Today we recommend the following:  Call us if you decide to do anything regarding your nodule.   Return if symptoms worsen or fail to improve.    Please do your part to reduce the spread of COVID-19.

## 2023-02-22 ENCOUNTER — Other Ambulatory Visit: Payer: Medicare Other

## 2023-02-23 ENCOUNTER — Encounter: Payer: Medicare Other | Admitting: Thoracic Surgery (Cardiothoracic Vascular Surgery)

## 2023-03-01 ENCOUNTER — Ambulatory Visit: Payer: Medicare Other | Admitting: Physician Assistant

## 2023-03-02 ENCOUNTER — Ambulatory Visit (INDEPENDENT_AMBULATORY_CARE_PROVIDER_SITE_OTHER): Payer: Medicare Other | Admitting: Gastroenterology

## 2023-03-02 ENCOUNTER — Encounter (INDEPENDENT_AMBULATORY_CARE_PROVIDER_SITE_OTHER): Payer: Self-pay | Admitting: Gastroenterology

## 2023-03-02 VITALS — BP 134/57 | HR 74 | Temp 97.8°F | Ht 61.5 in | Wt 121.7 lb

## 2023-03-02 DIAGNOSIS — E164 Increased secretion of gastrin: Secondary | ICD-10-CM | POA: Diagnosis not present

## 2023-03-02 DIAGNOSIS — R634 Abnormal weight loss: Secondary | ICD-10-CM | POA: Insufficient documentation

## 2023-03-02 DIAGNOSIS — R197 Diarrhea, unspecified: Secondary | ICD-10-CM

## 2023-03-02 MED ORDER — DIPHENOXYLATE-ATROPINE 2.5-0.025 MG PO TABS
1.0000 | ORAL_TABLET | Freq: Four times a day (QID) | ORAL | 0 refills | Status: DC | PRN
Start: 2023-03-02 — End: 2023-03-09
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
## 2023-03-02 NOTE — Patient Instructions (Signed)
I have sent a refill of lomotil for you to take for diarrhea Please keep appt for CT scheduled on 11/15 Make sure to stay well hydrated, you can try doing ensure or boost protein shakes to help with added nutrition  Follow up 3 months  It was a pleasure to see you today. I want to create trusting relationships with patients and provide genuine, compassionate, and quality care. I truly value your feedback! please be on the lookout for a survey regarding your visit with me today. I appreciate your input about our visit and your time in completing this!    Areona Homer L. Jeanmarie Hubert, MSN, APRN, AGNP-C Adult-Gerontology Nurse Practitioner [facility] Gastroenterology at [facility]

## 2023-03-09 ENCOUNTER — Encounter: Payer: Self-pay | Admitting: Nurse Practitioner

## 2023-03-09 ENCOUNTER — Ambulatory Visit (INDEPENDENT_AMBULATORY_CARE_PROVIDER_SITE_OTHER): Payer: Medicare Other | Admitting: Nurse Practitioner

## 2023-03-09 VITALS — BP 166/70 | HR 73 | Temp 97.1°F | Resp 20 | Ht 61.0 in | Wt 119.0 lb

## 2023-03-09 DIAGNOSIS — E8881 Metabolic syndrome: Secondary | ICD-10-CM | POA: Diagnosis not present

## 2023-03-09 DIAGNOSIS — I10 Essential (primary) hypertension: Secondary | ICD-10-CM

## 2023-03-09 DIAGNOSIS — D509 Iron deficiency anemia, unspecified: Secondary | ICD-10-CM

## 2023-03-09 DIAGNOSIS — K219 Gastro-esophageal reflux disease without esophagitis: Secondary | ICD-10-CM

## 2023-03-09 DIAGNOSIS — E782 Mixed hyperlipidemia: Secondary | ICD-10-CM

## 2023-03-09 DIAGNOSIS — M8580 Other specified disorders of bone density and structure, unspecified site: Secondary | ICD-10-CM

## 2023-03-09 DIAGNOSIS — R197 Diarrhea, unspecified: Secondary | ICD-10-CM

## 2023-03-09 DIAGNOSIS — Z6834 Body mass index (BMI) 34.0-34.9, adult: Secondary | ICD-10-CM

## 2023-03-09 DIAGNOSIS — Z720 Tobacco use: Secondary | ICD-10-CM | POA: Diagnosis not present

## 2023-03-09 DIAGNOSIS — E039 Hypothyroidism, unspecified: Secondary | ICD-10-CM

## 2023-03-09 DIAGNOSIS — R7309 Other abnormal glucose: Secondary | ICD-10-CM | POA: Diagnosis not present

## 2023-03-09 DIAGNOSIS — J4489 Other specified chronic obstructive pulmonary disease: Secondary | ICD-10-CM

## 2023-03-09 LAB — BAYER DCA HB A1C WAIVED: HB A1C (BAYER DCA - WAIVED): 5.7 % — ABNORMAL HIGH (ref 4.8–5.6)

## 2023-03-09 MED ORDER — FERROUS SULFATE 325 (65 FE) MG PO TABS
325.0000 mg | ORAL_TABLET | Freq: Every day | ORAL | 1 refills | Status: DC
Start: 2023-03-09 — End: 2023-09-01

## 2023-03-09 MED ORDER — ATORVASTATIN CALCIUM 80 MG PO TABS
80.0000 mg | ORAL_TABLET | Freq: Every day | ORAL | 1 refills | Status: DC
Start: 2023-03-09 — End: 2023-09-01

## 2023-03-09 MED ORDER — DIPHENOXYLATE-ATROPINE 2.5-0.025 MG PO TABS: 1.0000 | ORAL_TABLET | Freq: Four times a day (QID) | ORAL | 0 refills | Status: AC | PRN

## 2023-03-09 MED ORDER — AMLODIPINE BESYLATE 10 MG PO TABS
10.0000 mg | ORAL_TABLET | Freq: Every day | ORAL | 1 refills | Status: DC
Start: 2023-03-09 — End: 2023-09-01

## 2023-03-09 MED ORDER — LEVOTHYROXINE SODIUM 75 MCG PO TABS
75.0000 ug | ORAL_TABLET | Freq: Every day | ORAL | 1 refills | Status: DC
Start: 2023-03-09 — End: 2023-09-01

## 2023-03-09 MED ORDER — BUDESONIDE-FORMOTEROL FUMARATE 160-4.5 MCG/ACT IN AERO: INHALATION_SPRAY | RESPIRATORY_TRACT | 1 refills | Status: DC

## 2023-03-09 MED ORDER — OMEPRAZOLE 40 MG PO CPDR
40.0000 mg | DELAYED_RELEASE_CAPSULE | Freq: Every day | ORAL | 1 refills | Status: DC
Start: 2023-03-09 — End: 2023-09-01

## 2023-03-09 MED ORDER — FENOFIBRATE MICRONIZED 134 MG PO CAPS
ORAL_CAPSULE | ORAL | 1 refills | Status: DC
Start: 2023-03-09 — End: 2023-09-01

## 2023-03-09 NOTE — Patient Instructions (Signed)
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
## 2023-03-10 LAB — CMP14+EGFR
ALT: 12 IU/L (ref 0–32)
AST: 16 IU/L (ref 0–40)
Albumin: 4.2 g/dL (ref 3.8–4.8)
Alkaline Phosphatase: 50 [IU]/L (ref 44–121)
BUN/Creatinine Ratio: 13 (ref 12–28)
BUN: 8 mg/dL (ref 8–27)
Bilirubin Total: 0.3 mg/dL (ref 0.0–1.2)
CO2: 23 mmol/L (ref 20–29)
Calcium: 9.8 mg/dL (ref 8.7–10.3)
Chloride: 103 mmol/L (ref 96–106)
Creatinine, Ser: 0.63 mg/dL (ref 0.57–1.00)
Globulin, Total: 2.1 g/dL (ref 1.5–4.5)
Glucose: 111 mg/dL — ABNORMAL HIGH (ref 70–99)
Potassium: 4.3 mmol/L (ref 3.5–5.2)
Sodium: 143 mmol/L (ref 134–144)
Total Protein: 6.3 g/dL (ref 6.0–8.5)
eGFR: 94 mL/min/{1.73_m2} (ref >59)

## 2023-03-10 LAB — CBC WITH DIFFERENTIAL/PLATELET
Basophils Absolute: 0 10*3/uL (ref 0.0–0.2)
Basos: 0 %
EOS (ABSOLUTE): 0.1 10*3/uL (ref 0.0–0.4)
Eos: 1 %
Hematocrit: 42.8 % (ref 34.0–46.6)
Hemoglobin: 13.3 g/dL (ref 11.1–15.9)
Immature Grans (Abs): 0 10*3/uL (ref 0.0–0.1)
Immature Granulocytes: 0 %
Lymphocytes Absolute: 3.9 10*3/uL — ABNORMAL HIGH (ref 0.7–3.1)
Lymphs: 34 %
MCH: 28.2 pg (ref 26.6–33.0)
MCHC: 31.1 g/dL — ABNORMAL LOW (ref 31.5–35.7)
MCV: 91 fL (ref 79–97)
Monocytes Absolute: 0.6 10*3/uL (ref 0.1–0.9)
Monocytes: 5 %
Neutrophils Absolute: 6.7 10*3/uL (ref 1.4–7.0)
Neutrophils: 60 %
Platelets: 531 10*3/uL — ABNORMAL HIGH (ref 150–450)
RBC: 4.72 x10E6/uL (ref 3.77–5.28)
RDW: 13.8 % (ref 11.7–15.4)
WBC: 11.3 10*3/uL — ABNORMAL HIGH (ref 3.4–10.8)

## 2023-03-10 LAB — LIPID PANEL
Chol/HDL Ratio: 2.7 ratio (ref 0.0–4.4)
Cholesterol, Total: 145 mg/dL (ref 100–199)
HDL: 54 mg/dL (ref >39)
LDL Chol Calc (NIH): 71 mg/dL (ref 0–99)
Triglycerides: 110 mg/dL (ref 0–149)
VLDL Cholesterol Cal: 20 mg/dL (ref 5–40)

## 2023-03-11 ENCOUNTER — Other Ambulatory Visit: Payer: Self-pay | Admitting: Nurse Practitioner

## 2023-03-11 DIAGNOSIS — G8929 Other chronic pain: Secondary | ICD-10-CM

## 2023-03-17 ENCOUNTER — Telehealth (INDEPENDENT_AMBULATORY_CARE_PROVIDER_SITE_OTHER): Payer: Self-pay | Admitting: *Deleted

## 2023-03-17 NOTE — Telephone Encounter (Signed)
Copied from CRM [id number]. Topic: Clinical - Medication Question >> Mar 17, 2023 11:48 AM Herbert Seta B wrote: Reason for CRM:  levothyroxine (SYNTHROID) 75 MCG tablet Patient has questions on administration of medication.

## 2023-03-17 NOTE — Telephone Encounter (Signed)
Spoke to patients significant other.  He says he will have her call back when she gets home

## 2023-03-17 NOTE — Telephone Encounter (Signed)
Patient called and states she thinks the reason she was having diarrhea is because she was taking her levothyroxine, iron and anti acid med all together in the mornings. She said she has not had any diarrhea since she was told she was suppose to take levothyroxine 4 hours apart from iron and antiacids. She started taking levothryoxine at night and states she has not had diarrhea since then. She is still taking imodium bid but states she did not take today and not had diarrhea. I asked her if that was when the diarrhea stopped when she started imodium bid but she said no. I also told her to call pcp who prescribes levothyroxine to let her know she switched to taking it at night instead of the morning to make sure that was ok. She also wanted to see what you thought  [phone]

## 2023-03-17 NOTE — Telephone Encounter (Signed)
Discussed with patient per [facility] - The reason levothyroxine is advised to be taken apart from other medications is to try and ensure it is absorbed properly. Taking this with her iron pills could make her body not absorb the levothyroxine as well as it should, however taking them together should not cause diarrhea and her thyroid function was normal in January.   I would still recommend proceeding with CT on 11/15 as previously scheduled due to findings on her last CT concerning for possible decreased blood flow to her intestines. I would also recommend she follow up with the lung doctor regarding the concerning lesion in her lung as if this is cancerous it could also be contributing to her diarrhea symptoms.   Patient verbalized understanding.

## 2023-03-18 ENCOUNTER — Other Ambulatory Visit: Payer: Self-pay

## 2023-03-18 DIAGNOSIS — J4489 Other specified chronic obstructive pulmonary disease: Secondary | ICD-10-CM

## 2023-03-18 MED ORDER — MONTELUKAST SODIUM 10 MG PO TABS: 10.0000 mg | ORAL_TABLET | Freq: Every day | ORAL | 1 refills | Status: DC

## 2023-03-18 NOTE — Telephone Encounter (Signed)
Called and spoke with patient and she just wanted to let us know what the GI office had told her yesterday and also to get a refill on her singulair. Refill sent to pharmacy

## 2023-03-18 NOTE — Telephone Encounter (Unsigned)
Copied from CRM [id number]. Topic: General - Other >> Mar 17, 2023  2:03 PM Raven B wrote: Reason for CRM: PT returning call.

## 2023-03-19 ENCOUNTER — Ambulatory Visit (HOSPITAL_COMMUNITY)
Admission: RE | Admit: 2023-03-19 | Discharge: 2023-03-19 | Disposition: A | Discharge status: Home / Self Care | Payer: Medicare Other | Source: Ambulatory Visit | Attending: Gastroenterology | Admitting: Gastroenterology

## 2023-03-19 DIAGNOSIS — R634 Abnormal weight loss: Secondary | ICD-10-CM | POA: Diagnosis not present

## 2023-03-19 DIAGNOSIS — R197 Diarrhea, unspecified: Secondary | ICD-10-CM | POA: Insufficient documentation

## 2023-03-19 DIAGNOSIS — R1030 Lower abdominal pain, unspecified: Secondary | ICD-10-CM | POA: Diagnosis not present

## 2023-03-19 DIAGNOSIS — K551 Chronic vascular disorders of intestine: Secondary | ICD-10-CM | POA: Diagnosis not present

## 2023-03-19 DIAGNOSIS — I701 Atherosclerosis of renal artery: Secondary | ICD-10-CM | POA: Diagnosis not present

## 2023-03-19 DIAGNOSIS — R109 Unspecified abdominal pain: Secondary | ICD-10-CM | POA: Diagnosis not present

## 2023-03-19 DIAGNOSIS — K5730 Diverticulosis of large intestine without perforation or abscess without bleeding: Secondary | ICD-10-CM | POA: Diagnosis not present

## 2023-03-19 MED ORDER — IOHEXOL 350 MG/ML SOLN
100.0000 mL | Freq: Once | INTRAVENOUS | Status: AC | PRN
  Administered 2023-03-19: 100 mL via INTRAVENOUS

## 2023-03-30 ENCOUNTER — Other Ambulatory Visit: Payer: Self-pay | Admitting: Gastroenterology

## 2023-03-30 DIAGNOSIS — K551 Chronic vascular disorders of intestine: Secondary | ICD-10-CM
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
## 2023-04-12 ENCOUNTER — Ambulatory Visit
Admission: RE | Admit: 2023-04-12 | Discharge: 2023-04-12 | Disposition: A | Discharge status: Home / Self Care | Payer: Medicare Other | Source: Ambulatory Visit | Attending: Gastroenterology | Admitting: Gastroenterology

## 2023-04-12 DIAGNOSIS — I745 Embolism and thrombosis of iliac artery: Secondary | ICD-10-CM | POA: Diagnosis not present

## 2023-04-12 DIAGNOSIS — K551 Chronic vascular disorders of intestine: Secondary | ICD-10-CM

## 2023-04-12 DIAGNOSIS — I7409 Other arterial embolism and thrombosis of abdominal aorta: Secondary | ICD-10-CM | POA: Diagnosis not present

## 2023-04-12 HISTORY — PX: IR RADIOLOGIST EVAL & MGMT: IMG5224

## 2023-05-19 DIAGNOSIS — M9903 Segmental and somatic dysfunction of lumbar region: Secondary | ICD-10-CM | POA: Diagnosis not present

## 2023-05-19 DIAGNOSIS — M62830 Muscle spasm of back: Secondary | ICD-10-CM | POA: Diagnosis not present

## 2023-05-19 DIAGNOSIS — M9902 Segmental and somatic dysfunction of thoracic region: Secondary | ICD-10-CM | POA: Diagnosis not present

## 2023-05-19 DIAGNOSIS — M9901 Segmental and somatic dysfunction of cervical region: Secondary | ICD-10-CM | POA: Diagnosis not present

## 2023-06-07 ENCOUNTER — Other Ambulatory Visit: Payer: Self-pay | Admitting: Nurse Practitioner

## 2023-06-08 ENCOUNTER — Encounter (INDEPENDENT_AMBULATORY_CARE_PROVIDER_SITE_OTHER): Payer: Self-pay | Admitting: Gastroenterology

## 2023-06-08 ENCOUNTER — Ambulatory Visit (INDEPENDENT_AMBULATORY_CARE_PROVIDER_SITE_OTHER): Payer: Medicare Other | Admitting: Gastroenterology

## 2023-06-08 VITALS — BP 144/62 | HR 67 | Temp 98.0°F | Ht 62.0 in | Wt 125.5 lb

## 2023-06-08 DIAGNOSIS — R197 Diarrhea, unspecified: Secondary | ICD-10-CM | POA: Diagnosis not present
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
## 2023-06-08 NOTE — Patient Instructions (Signed)
-  continue to avoid foods that cause you diarrhea -keep appt with vascular surgery -let me know if you have any new or worsening GI issues  Follow up 3-4 months  It was a pleasure to see you today. I want to create trusting relationships with patients and provide genuine, compassionate, and quality care. I truly value your feedback! please be on the lookout for a survey regarding your visit with me today. I appreciate your input about our visit and your time in completing this!    Reygan Heagle L. Darryl Willner, MSN, APRN, AGNP-C Adult-Gerontology Nurse Practitioner [facility] at [facility]

## 2023-06-16 DIAGNOSIS — M62830 Muscle spasm of back: Secondary | ICD-10-CM | POA: Diagnosis not present

## 2023-06-16 DIAGNOSIS — M9903 Segmental and somatic dysfunction of lumbar region: Secondary | ICD-10-CM | POA: Diagnosis not present

## 2023-06-16 DIAGNOSIS — M9901 Segmental and somatic dysfunction of cervical region: Secondary | ICD-10-CM | POA: Diagnosis not present

## 2023-06-16 DIAGNOSIS — M9902 Segmental and somatic dysfunction of thoracic region: Secondary | ICD-10-CM | POA: Diagnosis not present

## 2023-07-06 ENCOUNTER — Ambulatory Visit: Payer: Medicare Other | Admitting: Vascular Surgery

## 2023-07-06 ENCOUNTER — Encounter: Payer: Self-pay | Admitting: Vascular Surgery

## 2023-07-06 VITALS — BP 138/63 | HR 69 | Resp 18 | Ht 62.0 in | Wt 124.4 lb

## 2023-07-06 DIAGNOSIS — I7409 Other arterial embolism and thrombosis of abdominal aorta: Secondary | ICD-10-CM | POA: Insufficient documentation

## 2023-07-06 DIAGNOSIS — K55069 Acute infarction of intestine, part and extent unspecified: Secondary | ICD-10-CM | POA: Insufficient documentation
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
## 2023-07-14 DIAGNOSIS — M62830 Muscle spasm of back: Secondary | ICD-10-CM | POA: Diagnosis not present

## 2023-07-14 DIAGNOSIS — M9903 Segmental and somatic dysfunction of lumbar region: Secondary | ICD-10-CM | POA: Diagnosis not present

## 2023-07-14 DIAGNOSIS — M9901 Segmental and somatic dysfunction of cervical region: Secondary | ICD-10-CM | POA: Diagnosis not present

## 2023-07-14 DIAGNOSIS — M9902 Segmental and somatic dysfunction of thoracic region: Secondary | ICD-10-CM | POA: Diagnosis not present

## 2023-07-15 ENCOUNTER — Other Ambulatory Visit: Payer: Self-pay | Admitting: *Deleted

## 2023-07-15 DIAGNOSIS — I7409 Other arterial embolism and thrombosis of abdominal aorta: Secondary | ICD-10-CM

## 2023-08-11 DIAGNOSIS — M9903 Segmental and somatic dysfunction of lumbar region: Secondary | ICD-10-CM | POA: Diagnosis not present

## 2023-08-11 DIAGNOSIS — M9901 Segmental and somatic dysfunction of cervical region: Secondary | ICD-10-CM | POA: Diagnosis not present

## 2023-08-11 DIAGNOSIS — M9902 Segmental and somatic dysfunction of thoracic region: Secondary | ICD-10-CM | POA: Diagnosis not present

## 2023-08-11 DIAGNOSIS — M62830 Muscle spasm of back: Secondary | ICD-10-CM | POA: Diagnosis not present

## 2023-08-12 ENCOUNTER — Other Ambulatory Visit: Payer: Self-pay | Admitting: Interventional Radiology

## 2023-08-12 DIAGNOSIS — K551 Chronic vascular disorders of intestine: Secondary | ICD-10-CM

## 2023-08-13 ENCOUNTER — Ambulatory Visit
Admission: RE | Admit: 2023-08-13 | Discharge: 2023-08-13 | Disposition: A | Discharge status: Home / Self Care | Source: Ambulatory Visit | Attending: Interventional Radiology | Admitting: Interventional Radiology

## 2023-08-13 DIAGNOSIS — K551 Chronic vascular disorders of intestine: Secondary | ICD-10-CM | POA: Diagnosis not present

## 2023-08-13 DIAGNOSIS — I7409 Other arterial embolism and thrombosis of abdominal aorta: Secondary | ICD-10-CM | POA: Diagnosis not present

## 2023-08-13 HISTORY — PX: IR RADIOLOGIST EVAL & MGMT: IMG5224
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
## 2023-08-31 ENCOUNTER — Other Ambulatory Visit: Payer: Self-pay | Admitting: Nurse Practitioner

## 2023-08-31 DIAGNOSIS — J4489 Other specified chronic obstructive pulmonary disease: Secondary | ICD-10-CM

## 2023-08-31 DIAGNOSIS — E782 Mixed hyperlipidemia: Secondary | ICD-10-CM

## 2023-08-31 DIAGNOSIS — D509 Iron deficiency anemia, unspecified: Secondary | ICD-10-CM

## 2023-08-31 DIAGNOSIS — G8929 Other chronic pain: Secondary | ICD-10-CM

## 2023-08-31 DIAGNOSIS — K219 Gastro-esophageal reflux disease without esophagitis: Secondary | ICD-10-CM

## 2023-08-31 DIAGNOSIS — I10 Essential (primary) hypertension: Secondary | ICD-10-CM

## 2023-08-31 DIAGNOSIS — E039 Hypothyroidism, unspecified: Secondary | ICD-10-CM

## 2023-09-07 ENCOUNTER — Encounter: Payer: Self-pay | Admitting: Nurse Practitioner

## 2023-09-07 ENCOUNTER — Ambulatory Visit (INDEPENDENT_AMBULATORY_CARE_PROVIDER_SITE_OTHER): Payer: Medicare Other | Admitting: Nurse Practitioner

## 2023-09-07 VITALS — BP 129/65 | HR 92 | Temp 97.6°F | Ht 62.0 in | Wt 121.0 lb

## 2023-09-07 DIAGNOSIS — J4489 Other specified chronic obstructive pulmonary disease: Secondary | ICD-10-CM | POA: Diagnosis not present

## 2023-09-07 DIAGNOSIS — Z0001 Encounter for general adult medical examination with abnormal findings: Secondary | ICD-10-CM

## 2023-09-07 DIAGNOSIS — D509 Iron deficiency anemia, unspecified: Secondary | ICD-10-CM

## 2023-09-07 DIAGNOSIS — F330 Major depressive disorder, recurrent, mild: Secondary | ICD-10-CM

## 2023-09-07 DIAGNOSIS — M5442 Lumbago with sciatica, left side: Secondary | ICD-10-CM | POA: Diagnosis not present

## 2023-09-07 DIAGNOSIS — Z720 Tobacco use: Secondary | ICD-10-CM

## 2023-09-07 DIAGNOSIS — E8881 Metabolic syndrome: Secondary | ICD-10-CM

## 2023-09-07 DIAGNOSIS — Z0000 Encounter for general adult medical examination without abnormal findings: Secondary | ICD-10-CM

## 2023-09-07 DIAGNOSIS — Z6834 Body mass index (BMI) 34.0-34.9, adult: Secondary | ICD-10-CM

## 2023-09-07 DIAGNOSIS — E039 Hypothyroidism, unspecified: Secondary | ICD-10-CM | POA: Diagnosis not present

## 2023-09-07 DIAGNOSIS — I10 Essential (primary) hypertension: Secondary | ICD-10-CM | POA: Diagnosis not present

## 2023-09-07 DIAGNOSIS — G8929 Other chronic pain: Secondary | ICD-10-CM

## 2023-09-07 DIAGNOSIS — K219 Gastro-esophageal reflux disease without esophagitis: Secondary | ICD-10-CM | POA: Diagnosis not present

## 2023-09-07 DIAGNOSIS — M8580 Other specified disorders of bone density and structure, unspecified site: Secondary | ICD-10-CM

## 2023-09-07 DIAGNOSIS — E782 Mixed hyperlipidemia: Secondary | ICD-10-CM

## 2023-09-07 DIAGNOSIS — M5441 Lumbago with sciatica, right side: Secondary | ICD-10-CM

## 2023-09-07 DIAGNOSIS — I7409 Other arterial embolism and thrombosis of abdominal aorta: Secondary | ICD-10-CM

## 2023-09-07 LAB — CBC WITH DIFFERENTIAL/PLATELET
Basophils Absolute: 0.1 10*3/uL (ref 0.0–0.2)
Basos: 0 %
EOS (ABSOLUTE): 0.1 10*3/uL (ref 0.0–0.4)
Eos: 1 %
Hematocrit: 40.8 % (ref 34.0–46.6)
Hemoglobin: 13.5 g/dL (ref 11.1–15.9)
Immature Grans (Abs): 0.1 10*3/uL (ref 0.0–0.1)
Immature Granulocytes: 1 %
Lymphocytes Absolute: 3.6 10*3/uL — ABNORMAL HIGH (ref 0.7–3.1)
Lymphs: 27 %
MCH: 30.4 pg (ref 26.6–33.0)
MCHC: 33.1 g/dL (ref 31.5–35.7)
MCV: 92 fL (ref 79–97)
Monocytes Absolute: 0.6 10*3/uL (ref 0.1–0.9)
Monocytes: 4 %
Neutrophils Absolute: 9.2 10*3/uL — ABNORMAL HIGH (ref 1.4–7.0)
Neutrophils: 67 %
Platelets: 461 10*3/uL — ABNORMAL HIGH (ref 150–450)
RBC: 4.44 x10E6/uL (ref 3.77–5.28)
RDW: 12.9 % (ref 11.7–15.4)
WBC: 13.6 10*3/uL — ABNORMAL HIGH (ref 3.4–10.8)

## 2023-09-07 LAB — BAYER DCA HB A1C WAIVED: HB A1C (BAYER DCA - WAIVED): 5.7 % — ABNORMAL HIGH (ref 4.8–5.6)

## 2023-09-07 LAB — CMP14+EGFR
ALT: 14 IU/L (ref 0–32)
AST: 19 IU/L (ref 0–40)
Albumin: 4.1 g/dL (ref 3.8–4.8)
Alkaline Phosphatase: 48 IU/L (ref 44–121)
BUN/Creatinine Ratio: 11 — ABNORMAL LOW (ref 12–28)
BUN: 7 mg/dL — ABNORMAL LOW (ref 8–27)
Bilirubin Total: 0.3 mg/dL (ref 0.0–1.2)
CO2: 21 mmol/L (ref 20–29)
Calcium: 9.9 mg/dL (ref 8.7–10.3)
Chloride: 102 mmol/L (ref 96–106)
Creatinine, Ser: 0.61 mg/dL (ref 0.57–1.00)
Globulin, Total: 2.2 g/dL (ref 1.5–4.5)
Glucose: 105 mg/dL — ABNORMAL HIGH (ref 70–99)
Potassium: 4.3 mmol/L (ref 3.5–5.2)
Sodium: 141 mmol/L (ref 134–144)
Total Protein: 6.3 g/dL (ref 6.0–8.5)
eGFR: 95 mL/min/{1.73_m2} (ref >59)

## 2023-09-07 LAB — LIPID PANEL
Chol/HDL Ratio: 2.4 ratio (ref 0.0–4.4)
Cholesterol, Total: 123 mg/dL (ref 100–199)
HDL: 51 mg/dL (ref >39)
LDL Chol Calc (NIH): 54 mg/dL (ref 0–99)
Triglycerides: 97 mg/dL (ref 0–149)
VLDL Cholesterol Cal: 18 mg/dL (ref 5–40)

## 2023-09-07 MED ORDER — LEVOTHYROXINE SODIUM 75 MCG PO TABS: 75.0000 ug | ORAL_TABLET | Freq: Every day | ORAL | 1 refills | Status: DC

## 2023-09-07 MED ORDER — BUDESONIDE-FORMOTEROL FUMARATE 160-4.5 MCG/ACT IN AERO: INHALATION_SPRAY | RESPIRATORY_TRACT | 1 refills | Status: DC

## 2023-09-07 MED ORDER — OMEPRAZOLE 40 MG PO CPDR: 40.0000 mg | DELAYED_RELEASE_CAPSULE | Freq: Every day | ORAL | 1 refills | Status: DC

## 2023-09-07 MED ORDER — FENOFIBRATE MICRONIZED 134 MG PO CAPS
ORAL_CAPSULE | ORAL | 1 refills | Status: DC
Start: 2023-09-07 — End: 2024-03-09

## 2023-09-07 MED ORDER — MONTELUKAST SODIUM 10 MG PO TABS: 10.0000 mg | ORAL_TABLET | Freq: Every day | ORAL | 1 refills | Status: DC

## 2023-09-07 MED ORDER — ATORVASTATIN CALCIUM 80 MG PO TABS: 80.0000 mg | ORAL_TABLET | Freq: Every day | ORAL | 1 refills | Status: DC

## 2023-09-07 MED ORDER — LORATADINE 10 MG PO TABS: 10.0000 mg | ORAL_TABLET | Freq: Every day | ORAL | 1 refills | Status: DC

## 2023-09-07 MED ORDER — TIZANIDINE HCL 4 MG PO TABS: 4.0000 mg | ORAL_TABLET | Freq: Every day | ORAL | 1 refills | Status: DC

## 2023-09-07 MED ORDER — AMLODIPINE BESYLATE 10 MG PO TABS: 10.0000 mg | ORAL_TABLET | Freq: Every day | ORAL | 1 refills | Status: DC
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
## 2023-09-07 NOTE — Patient Instructions (Signed)

## 2023-09-08 DIAGNOSIS — M9901 Segmental and somatic dysfunction of cervical region: Secondary | ICD-10-CM | POA: Diagnosis not present

## 2023-09-08 DIAGNOSIS — M9903 Segmental and somatic dysfunction of lumbar region: Secondary | ICD-10-CM | POA: Diagnosis not present

## 2023-09-08 DIAGNOSIS — M9902 Segmental and somatic dysfunction of thoracic region: Secondary | ICD-10-CM | POA: Diagnosis not present

## 2023-09-08 DIAGNOSIS — M62830 Muscle spasm of back: Secondary | ICD-10-CM | POA: Diagnosis not present

## 2023-09-15 ENCOUNTER — Ambulatory Visit: Payer: Medicare Other

## 2023-09-15 VITALS — BP 129/65 | HR 92 | Ht 62.0 in | Wt 121.0 lb

## 2023-09-15 DIAGNOSIS — Z0000 Encounter for general adult medical examination without abnormal findings: Secondary | ICD-10-CM

## 2023-09-15 NOTE — Patient Instructions (Signed)
 [name] , Thank you for taking time out of your busy schedule to complete your Annual Wellness Visit with me. I enjoyed our conversation and look forward to speaking with you again next year. I, as well as your care team,  appreciate your ongoing commitment to your health goals. Please review the following plan we discussed and let me know if I can assist you in the future. Your Game plan/ To Do List   Follow up Visits: Next Medicare AWV with our clinical staff: Friday, 09/15/24 at 10:40a.m.    Next Office Visit with your provider: n/a  Clinician Recommendations:  Aim for 30 minutes of exercise or brisk walking, 6-8 glasses of water , and 5 servings of fruits and vegetables each day. N/a      This is a list of the screening recommended for you and due dates:  Health Maintenance  Topic Date Due   COVID-19 Vaccine (2 - 2024-25 season) 09/23/2023*   Zoster (Shingles) Vaccine (1 of 2) 12/08/2023*   Mammogram  09/30/2023   Flu Shot  12/03/2023   Screening for Lung Cancer  12/10/2023   DEXA scan (bone density measurement)  06/10/2024   Medicare Annual Wellness Visit  09/14/2024   Colon Cancer Screening  11/06/2030   DTaP/Tdap/Td vaccine (3 - Td or Tdap) 05/23/2031   Pneumonia Vaccine  Completed   Hepatitis C Screening  Completed   HPV Vaccine  Aged Out   Meningitis B Vaccine  Aged Out   Cologuard (Stool DNA test)  Discontinued  *Topic was postponed. The date shown is not the original due date.    Advanced directives: (Declined) Advance directive discussed with you today. Even though you declined this today, please call our office should you change your mind, and we can give you the proper paperwork for you to fill out. Advance Care Planning is important because it:  [x]  Makes sure you receive the medical care that is consistent with your values, goals, and preferences  [x]  It provides guidance to your family and loved ones and reduces their decisional burden about whether or not they  are making the right decisions based on your wishes.  Follow the link provided in your after visit summary or read over the paperwork we have mailed to you to help you started getting your Advance Directives in place. If you need assistance in completing these, please reach out to us  so that we can help you!  See attachments for Preventive Care and Fall Prevention Tips.
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
## 2023-10-05 ENCOUNTER — Ambulatory Visit: Admitting: Vascular Surgery

## 2023-10-05 ENCOUNTER — Ambulatory Visit (INDEPENDENT_AMBULATORY_CARE_PROVIDER_SITE_OTHER): Payer: Medicare Other | Admitting: Gastroenterology

## 2023-10-05 ENCOUNTER — Encounter

## 2023-10-05 ENCOUNTER — Encounter (INDEPENDENT_AMBULATORY_CARE_PROVIDER_SITE_OTHER): Payer: Self-pay | Admitting: Gastroenterology

## 2023-10-05 VITALS — BP 116/51 | HR 67 | Temp 99.2°F | Ht 61.5 in | Wt 119.7 lb

## 2023-10-05 DIAGNOSIS — R197 Diarrhea, unspecified: Secondary | ICD-10-CM

## 2023-10-05 DIAGNOSIS — R634 Abnormal weight loss: Secondary | ICD-10-CM

## 2023-10-05 NOTE — Patient Instructions (Signed)
 Please continue with imodium and avoiding trigger foods We will keep a close eye on your weight Continue to follow with Dr. Fulton Job  Follow up 4 months  It was a pleasure to see you today. I want to create trusting relationships with patients and provide genuine, compassionate, and quality care. I truly value your feedback! please be on the lookout for a survey regarding your visit with me today. I appreciate your input about our visit and your time in completing this!    Adely Facer L. Zohar Maroney, MSN, APRN, AGNP-C Adult-Gerontology Nurse Practitioner [facility] at [facility]
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
## 2023-10-13 DIAGNOSIS — M9902 Segmental and somatic dysfunction of thoracic region: Secondary | ICD-10-CM | POA: Diagnosis not present

## 2023-10-13 DIAGNOSIS — M9901 Segmental and somatic dysfunction of cervical region: Secondary | ICD-10-CM | POA: Diagnosis not present

## 2023-10-13 DIAGNOSIS — M9903 Segmental and somatic dysfunction of lumbar region: Secondary | ICD-10-CM | POA: Diagnosis not present

## 2023-10-13 DIAGNOSIS — M62830 Muscle spasm of back: Secondary | ICD-10-CM | POA: Diagnosis not present

## 2023-11-10 DIAGNOSIS — M9902 Segmental and somatic dysfunction of thoracic region: Secondary | ICD-10-CM | POA: Diagnosis not present

## 2023-11-10 DIAGNOSIS — M9901 Segmental and somatic dysfunction of cervical region: Secondary | ICD-10-CM | POA: Diagnosis not present

## 2023-11-10 DIAGNOSIS — M9903 Segmental and somatic dysfunction of lumbar region: Secondary | ICD-10-CM | POA: Diagnosis not present

## 2023-11-10 DIAGNOSIS — M62830 Muscle spasm of back: Secondary | ICD-10-CM | POA: Diagnosis not present

## 2023-11-16 ENCOUNTER — Encounter

## 2023-11-16 ENCOUNTER — Ambulatory Visit: Admitting: Vascular Surgery

## 2023-11-30 ENCOUNTER — Other Ambulatory Visit: Payer: Self-pay | Admitting: Nurse Practitioner

## 2023-11-30 DIAGNOSIS — D509 Iron deficiency anemia, unspecified: Secondary | ICD-10-CM

## 2023-12-06 DIAGNOSIS — Z008 Encounter for other general examination: Secondary | ICD-10-CM | POA: Diagnosis not present

## 2023-12-06 DIAGNOSIS — Z8249 Family history of ischemic heart disease and other diseases of the circulatory system: Secondary | ICD-10-CM | POA: Diagnosis not present

## 2023-12-06 DIAGNOSIS — E785 Hyperlipidemia, unspecified: Secondary | ICD-10-CM | POA: Diagnosis not present

## 2023-12-06 DIAGNOSIS — I10 Essential (primary) hypertension: Secondary | ICD-10-CM | POA: Diagnosis not present

## 2023-12-06 DIAGNOSIS — Z882 Allergy status to sulfonamides status: Secondary | ICD-10-CM | POA: Diagnosis not present

## 2023-12-06 DIAGNOSIS — Z720 Tobacco use: Secondary | ICD-10-CM | POA: Diagnosis not present

## 2023-12-06 DIAGNOSIS — J449 Chronic obstructive pulmonary disease, unspecified: Secondary | ICD-10-CM | POA: Diagnosis not present

## 2023-12-06 DIAGNOSIS — Z7951 Long term (current) use of inhaled steroids: Secondary | ICD-10-CM | POA: Diagnosis not present

## 2023-12-06 DIAGNOSIS — J302 Other seasonal allergic rhinitis: Secondary | ICD-10-CM | POA: Diagnosis not present

## 2023-12-06 DIAGNOSIS — Z833 Family history of diabetes mellitus: Secondary | ICD-10-CM | POA: Diagnosis not present

## 2023-12-06 DIAGNOSIS — E039 Hypothyroidism, unspecified: Secondary | ICD-10-CM | POA: Diagnosis not present

## 2023-12-06 DIAGNOSIS — F324 Major depressive disorder, single episode, in partial remission: Secondary | ICD-10-CM | POA: Diagnosis not present

## 2023-12-06 DIAGNOSIS — Z809 Family history of malignant neoplasm, unspecified: Secondary | ICD-10-CM | POA: Diagnosis not present

## 2023-12-07 ENCOUNTER — Ambulatory Visit: Admitting: Vascular Surgery

## 2023-12-07 ENCOUNTER — Ambulatory Visit

## 2023-12-07 DIAGNOSIS — I7409 Other arterial embolism and thrombosis of abdominal aorta: Secondary | ICD-10-CM | POA: Diagnosis not present

## 2023-12-08 DIAGNOSIS — M9901 Segmental and somatic dysfunction of cervical region: Secondary | ICD-10-CM | POA: Diagnosis not present

## 2023-12-08 DIAGNOSIS — M62830 Muscle spasm of back: Secondary | ICD-10-CM | POA: Diagnosis not present

## 2023-12-08 DIAGNOSIS — M9902 Segmental and somatic dysfunction of thoracic region: Secondary | ICD-10-CM | POA: Diagnosis not present

## 2023-12-08 DIAGNOSIS — M9903 Segmental and somatic dysfunction of lumbar region: Secondary | ICD-10-CM | POA: Diagnosis not present

## 2023-12-08 LAB — VAS US ABI WITH/WO TBI
Left ABI: 0.42
Right ABI: 0.5

## 2024-01-04 ENCOUNTER — Encounter: Payer: Self-pay | Admitting: Vascular Surgery

## 2024-01-04 ENCOUNTER — Ambulatory Visit: Admitting: Vascular Surgery

## 2024-01-04 VITALS — BP 142/66 | HR 64 | Temp 97.2°F | Resp 18 | Ht 61.5 in | Wt 119.6 lb

## 2024-01-04 DIAGNOSIS — I7409 Other arterial embolism and thrombosis of abdominal aorta: Secondary | ICD-10-CM | POA: Diagnosis not present

## 2024-01-04 DIAGNOSIS — K55069 Acute infarction of intestine, part and extent unspecified: Secondary | ICD-10-CM
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
## 2024-01-05 ENCOUNTER — Telehealth: Payer: Self-pay

## 2024-01-05 DIAGNOSIS — M9903 Segmental and somatic dysfunction of lumbar region: Secondary | ICD-10-CM | POA: Diagnosis not present

## 2024-01-05 DIAGNOSIS — M62830 Muscle spasm of back: Secondary | ICD-10-CM | POA: Diagnosis not present

## 2024-01-05 DIAGNOSIS — M9902 Segmental and somatic dysfunction of thoracic region: Secondary | ICD-10-CM | POA: Diagnosis not present

## 2024-01-05 DIAGNOSIS — M9901 Segmental and somatic dysfunction of cervical region: Secondary | ICD-10-CM | POA: Diagnosis not present

## 2024-01-05 NOTE — Telephone Encounter (Signed)
 Tried to call the pt to schedule new pt appt , see notes. Line was busy.

## 2024-01-05 NOTE — Telephone Encounter (Signed)
   Name: Karla Maxwell  DOB: 03-Sep-1950  MRN: [id number]  Primary Cardiologist: None  Patient recently seen by vascular surgery yesterday with plans for aortobifemoral bypass and with urgent request for preoperative evaluation.  Please call patient and arrange follow-up as soon as possible with [facility] or [facility].  They are new patient with no obvious cardiac history.  Thom LITTIE Sluder, PA-C  01/05/2024, 3:31 PM

## 2024-01-05 NOTE — Telephone Encounter (Signed)
   Pre-operative Risk Assessment    Patient Name: Karla Maxwell  DOB: 1950/06/06 MRN: [id number]   Date of last office visit: NONE Date of next office visit: NONE   Request for Surgical Clearance    Procedure:  AORTOBIFEMORAL BYPASS  Date of Surgery:  Clearance TBD       PER REQUEST ASAP                         Surgeon:  DR GRETTA Socks Group or Practice Name:  [facility] Phone number:  [phone] Fax number:  [phone]   Type of Clearance Requested:   - Medical    Type of Anesthesia:  General    Additional requests/questions:    Signed, Lucie DELENA Ku   01/05/2024, 3:17 PM

## 2024-01-07 ENCOUNTER — Ambulatory Visit: Attending: Cardiology | Admitting: Cardiology

## 2024-01-07 VITALS — BP 150/54 | HR 65 | Ht 62.0 in | Wt 114.4 lb

## 2024-01-07 DIAGNOSIS — Z01810 Encounter for preprocedural cardiovascular examination: Secondary | ICD-10-CM

## 2024-01-07 DIAGNOSIS — R0609 Other forms of dyspnea: Secondary | ICD-10-CM

## 2024-01-07 DIAGNOSIS — Z720 Tobacco use: Secondary | ICD-10-CM | POA: Diagnosis not present

## 2024-01-07 DIAGNOSIS — I739 Peripheral vascular disease, unspecified: Secondary | ICD-10-CM | POA: Diagnosis not present

## 2024-01-07 DIAGNOSIS — I25118 Atherosclerotic heart disease of native coronary artery with other forms of angina pectoris: Secondary | ICD-10-CM | POA: Diagnosis not present

## 2024-01-07 DIAGNOSIS — R911 Solitary pulmonary nodule: Secondary | ICD-10-CM | POA: Diagnosis not present

## 2024-01-07 DIAGNOSIS — R0989 Other specified symptoms and signs involving the circulatory and respiratory systems: Secondary | ICD-10-CM | POA: Diagnosis not present

## 2024-01-07 NOTE — Telephone Encounter (Signed)
 2nd attempt to reach pt ; line busy.   I will update the requesting office pt needs to call our office to schedule a new pt appt for preop clearance.

## 2024-01-07 NOTE — Patient Instructions (Addendum)
 Medication Instructions:  Continue same medications  Lab Work: None ordered  Testing/Procedures: Chest CT    Echo First Available   No Prep   Lexiscan Myoview Do Not eat or Drink 3 hours before You may have water  Do Not consume caffeine 12 hours before Wear comfortable clothes No perfume No lotion   Carotid Doppler  No Prep      Follow-Up: At [facility], you and your health needs are our priority.  As part of our continuing mission to provide you with exceptional heart care, our providers are all part of one team.  This team includes your primary Cardiologist (physician) and Advanced Practice Providers or APPs (Physician Assistants and Nurse Practitioners) who all work together to provide you with the care you need, when you need it.  Your next appointment:  After Test    Provider:  Dr.Jordan          Monitor blood pressure daily and bring readings to next appointment  ( Omron monitor )    We recommend signing up for the patient portal called MyChart.  Sign up information is provided on this After Visit Summary.  MyChart is used to connect with patients for Virtual Visits (Telemedicine).  Patients are able to view lab/test results, encounter notes, upcoming appointments, etc.  Non-urgent messages can be sent to your provider as well.   To learn more about what you can do with MyChart, go to [email or web address].
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
## 2024-01-10 NOTE — Telephone Encounter (Signed)
 Pt had new pt appt with DR. [place] 01/07/24. I will forward to preop APP to confirm if pt has been cleared by MD. I do see in the MD notes looks the pt is going to need cardiac testing as well.

## 2024-01-18 ENCOUNTER — Ambulatory Visit: Admitting: Vascular Surgery

## 2024-01-20 ENCOUNTER — Ambulatory Visit (HOSPITAL_COMMUNITY)
Admission: RE | Admit: 2024-01-20 | Discharge: 2024-01-20 | Disposition: A | Discharge status: Home / Self Care | Source: Ambulatory Visit | Attending: Cardiology | Admitting: Cardiology

## 2024-01-20 DIAGNOSIS — I25118 Atherosclerotic heart disease of native coronary artery with other forms of angina pectoris: Secondary | ICD-10-CM | POA: Diagnosis not present

## 2024-01-20 DIAGNOSIS — Z01810 Encounter for preprocedural cardiovascular examination: Secondary | ICD-10-CM | POA: Diagnosis not present

## 2024-01-20 DIAGNOSIS — Z720 Tobacco use: Secondary | ICD-10-CM | POA: Diagnosis not present

## 2024-01-20 DIAGNOSIS — R911 Solitary pulmonary nodule: Secondary | ICD-10-CM | POA: Diagnosis not present

## 2024-01-20 DIAGNOSIS — R0600 Dyspnea, unspecified: Secondary | ICD-10-CM | POA: Diagnosis not present

## 2024-01-20 DIAGNOSIS — I739 Peripheral vascular disease, unspecified: Secondary | ICD-10-CM | POA: Insufficient documentation

## 2024-01-20 DIAGNOSIS — R0989 Other specified symptoms and signs involving the circulatory and respiratory systems: Secondary | ICD-10-CM | POA: Diagnosis not present

## 2024-01-20 DIAGNOSIS — R0609 Other forms of dyspnea: Secondary | ICD-10-CM | POA: Insufficient documentation

## 2024-01-20 DIAGNOSIS — J439 Emphysema, unspecified: Secondary | ICD-10-CM | POA: Diagnosis not present

## 2024-01-20 DIAGNOSIS — R918 Other nonspecific abnormal finding of lung field: Secondary | ICD-10-CM | POA: Diagnosis not present

## 2024-01-24 NOTE — Telephone Encounter (Signed)
 Patient has Lexiscan Myoview and Echo scheduled for 02/18/2024 for further risk stratification. Will need to wait on these.   Aprile Dickenson E Cahterine Heinzel, PA-C 01/24/2024 8:24 AM

## 2024-01-27 ENCOUNTER — Ambulatory Visit: Payer: Self-pay | Admitting: Cardiology

## 2024-01-28 ENCOUNTER — Other Ambulatory Visit: Payer: Self-pay

## 2024-01-28 DIAGNOSIS — R911 Solitary pulmonary nodule: Secondary | ICD-10-CM

## 2024-02-01 ENCOUNTER — Encounter (HOSPITAL_COMMUNITY): Payer: Self-pay

## 2024-02-02 ENCOUNTER — Encounter (INDEPENDENT_AMBULATORY_CARE_PROVIDER_SITE_OTHER): Payer: Self-pay | Admitting: Gastroenterology

## 2024-02-02 DIAGNOSIS — M62830 Muscle spasm of back: Secondary | ICD-10-CM | POA: Diagnosis not present

## 2024-02-02 DIAGNOSIS — M9903 Segmental and somatic dysfunction of lumbar region: Secondary | ICD-10-CM | POA: Diagnosis not present

## 2024-02-02 DIAGNOSIS — M9901 Segmental and somatic dysfunction of cervical region: Secondary | ICD-10-CM | POA: Diagnosis not present

## 2024-02-02 DIAGNOSIS — M9902 Segmental and somatic dysfunction of thoracic region: Secondary | ICD-10-CM | POA: Diagnosis not present

## 2024-02-16 ENCOUNTER — Other Ambulatory Visit: Payer: Self-pay | Admitting: Cardiology

## 2024-02-16 ENCOUNTER — Encounter (INDEPENDENT_AMBULATORY_CARE_PROVIDER_SITE_OTHER): Payer: Self-pay | Admitting: Gastroenterology

## 2024-02-16 DIAGNOSIS — Z01810 Encounter for preprocedural cardiovascular examination: Secondary | ICD-10-CM

## 2024-02-16 DIAGNOSIS — R0609 Other forms of dyspnea: Secondary | ICD-10-CM

## 2024-02-16 DIAGNOSIS — R0989 Other specified symptoms and signs involving the circulatory and respiratory systems: Secondary | ICD-10-CM

## 2024-02-16 DIAGNOSIS — R911 Solitary pulmonary nodule: Secondary | ICD-10-CM

## 2024-02-16 DIAGNOSIS — Z720 Tobacco use: Secondary | ICD-10-CM

## 2024-02-16 DIAGNOSIS — I739 Peripheral vascular disease, unspecified: Secondary | ICD-10-CM

## 2024-02-16 DIAGNOSIS — I25118 Atherosclerotic heart disease of native coronary artery with other forms of angina pectoris: Secondary | ICD-10-CM

## 2024-02-18 ENCOUNTER — Ambulatory Visit (HOSPITAL_COMMUNITY)
Admission: RE | Admit: 2024-02-18 | Discharge: 2024-02-18 | Disposition: A | Source: Ambulatory Visit | Attending: Cardiology | Admitting: Cardiology

## 2024-02-18 ENCOUNTER — Ambulatory Visit (HOSPITAL_COMMUNITY)
Admission: RE | Admit: 2024-02-18 | Discharge: 2024-02-18 | Disposition: A | Discharge status: Home / Self Care | Source: Ambulatory Visit | Attending: Cardiology | Admitting: Cardiology

## 2024-02-18 ENCOUNTER — Ambulatory Visit (HOSPITAL_BASED_OUTPATIENT_CLINIC_OR_DEPARTMENT_OTHER)
Admission: RE | Admit: 2024-02-18 | Discharge: 2024-02-18 | Disposition: A | Discharge status: Home / Self Care | Source: Ambulatory Visit | Attending: Cardiology | Admitting: Cardiology

## 2024-02-18 DIAGNOSIS — R911 Solitary pulmonary nodule: Secondary | ICD-10-CM

## 2024-02-18 DIAGNOSIS — I25118 Atherosclerotic heart disease of native coronary artery with other forms of angina pectoris: Secondary | ICD-10-CM

## 2024-02-18 DIAGNOSIS — R0989 Other specified symptoms and signs involving the circulatory and respiratory systems: Secondary | ICD-10-CM

## 2024-02-18 DIAGNOSIS — Z720 Tobacco use: Secondary | ICD-10-CM

## 2024-02-18 DIAGNOSIS — I739 Peripheral vascular disease, unspecified: Secondary | ICD-10-CM

## 2024-02-18 DIAGNOSIS — R0609 Other forms of dyspnea: Secondary | ICD-10-CM | POA: Diagnosis not present

## 2024-02-18 DIAGNOSIS — Z01810 Encounter for preprocedural cardiovascular examination: Secondary | ICD-10-CM

## 2024-02-18 DIAGNOSIS — I081 Rheumatic disorders of both mitral and tricuspid valves: Secondary | ICD-10-CM | POA: Diagnosis not present

## 2024-02-18 LAB — ECHOCARDIOGRAM COMPLETE
Area-P 1/2: 4.45 cm2
Est EF: >75
S' Lateral: 2.2 cm

## 2024-02-18 MED ORDER — TECHNETIUM TC 99M TETROFOSMIN IV KIT
31.5000 | PACK | Freq: Once | INTRAVENOUS | Status: AC | PRN
  Administered 2024-02-18: 31.5 via INTRAVENOUS

## 2024-02-18 MED ORDER — TECHNETIUM TC 99M TETROFOSMIN IV KIT
10.4000 | PACK | Freq: Once | INTRAVENOUS | Status: AC | PRN
  Administered 2024-02-18: 10.4 via INTRAVENOUS

## 2024-02-18 MED ORDER — REGADENOSON 0.4 MG/5ML IV SOLN
INTRAVENOUS | Status: AC
  Filled 2024-02-18: qty 5

## 2024-02-18 MED ORDER — REGADENOSON 0.4 MG/5ML IV SOLN
0.4000 mg | Freq: Once | INTRAVENOUS | Status: AC
  Administered 2024-02-18: 0.4 mg via INTRAVENOUS

## 2024-02-19 LAB — MYOCARDIAL PERFUSION IMAGING
LV dias vol: 73 mL (ref 46–106)
LV sys vol: 23 mL (ref 3.8–5.2)
Nuc Stress EF: 68 %
Peak HR: 100 {beats}/min
Rest HR: 50 {beats}/min
Rest Nuclear Isotope Dose: 10.4 mCi
SDS: 0
SRS: 0
SSS: 0
ST Depression (mm): 0 mm
Stress Nuclear Isotope Dose: 31.5 mCi
TID: 1.1

## 2024-02-23 NOTE — Telephone Encounter (Signed)
 Dr. [place],   Will you please comment on patient undergoing aortobifemoral bypass and with urgent request for preoperative evaluation.  She had a stress test and echocardiogram on 02/18/2024.

## 2024-02-23 NOTE — Telephone Encounter (Signed)
   Name:  Karla Maxwell  DOB:  [birth year]  MRN:  [id number]   Primary Cardiologist: Dr. [place]  Chart reviewed as part of pre-operative protocol coverage. Patient was contacted 02/23/2024 in reference to pre-operative risk assessment for pending surgery as outlined below.    Per Dr. [place] 02/23/2024 I think her CV risk is low but her CT shows an enlarging lung cancer that she doesn't want treated so they should hold off on doing any surgery as her prognosis is poor from that standpoint. I had communicated this before to vascular surgery   PJ   Lamarr Satterfield, NP 02/23/2024, 3:58 PM

## 2024-02-28 ENCOUNTER — Telehealth: Payer: Self-pay

## 2024-02-28 NOTE — Telephone Encounter (Signed)
 [facility] - please see message from Dr. Jordan (Cardiology) on 10/22. Recommending conservative treatment of atherosclerosis.

## 2024-03-01 DIAGNOSIS — M62830 Muscle spasm of back: Secondary | ICD-10-CM | POA: Diagnosis not present

## 2024-03-01 DIAGNOSIS — M9903 Segmental and somatic dysfunction of lumbar region: Secondary | ICD-10-CM | POA: Diagnosis not present

## 2024-03-01 DIAGNOSIS — M9902 Segmental and somatic dysfunction of thoracic region: Secondary | ICD-10-CM | POA: Diagnosis not present

## 2024-03-01 DIAGNOSIS — M9901 Segmental and somatic dysfunction of cervical region: Secondary | ICD-10-CM | POA: Diagnosis not present
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
## 2024-03-09 ENCOUNTER — Encounter: Payer: Self-pay | Admitting: Nurse Practitioner

## 2024-03-09 ENCOUNTER — Ambulatory Visit: Payer: Self-pay | Admitting: Nurse Practitioner

## 2024-03-09 VITALS — BP 148/70 | HR 67 | Temp 97.6°F | Ht 62.0 in | Wt 121.0 lb

## 2024-03-09 DIAGNOSIS — K219 Gastro-esophageal reflux disease without esophagitis: Secondary | ICD-10-CM

## 2024-03-09 DIAGNOSIS — E782 Mixed hyperlipidemia: Secondary | ICD-10-CM | POA: Diagnosis not present

## 2024-03-09 DIAGNOSIS — M5442 Lumbago with sciatica, left side: Secondary | ICD-10-CM | POA: Diagnosis not present

## 2024-03-09 DIAGNOSIS — I10 Essential (primary) hypertension: Secondary | ICD-10-CM

## 2024-03-09 DIAGNOSIS — M5441 Lumbago with sciatica, right side: Secondary | ICD-10-CM | POA: Diagnosis not present

## 2024-03-09 DIAGNOSIS — D509 Iron deficiency anemia, unspecified: Secondary | ICD-10-CM

## 2024-03-09 DIAGNOSIS — E039 Hypothyroidism, unspecified: Secondary | ICD-10-CM

## 2024-03-09 DIAGNOSIS — J4489 Other specified chronic obstructive pulmonary disease: Secondary | ICD-10-CM | POA: Diagnosis not present

## 2024-03-09 DIAGNOSIS — G8929 Other chronic pain: Secondary | ICD-10-CM

## 2024-03-09 DIAGNOSIS — E8881 Metabolic syndrome: Secondary | ICD-10-CM | POA: Diagnosis not present

## 2024-03-09 LAB — LIPID PANEL

## 2024-03-09 LAB — BAYER DCA HB A1C WAIVED: HB A1C (BAYER DCA - WAIVED): 5.5 % (ref 4.8–5.6)

## 2024-03-09 MED ORDER — LEVOTHYROXINE SODIUM 75 MCG PO TABS: 75.0000 ug | ORAL_TABLET | Freq: Every day | ORAL | 1 refills | Status: AC

## 2024-03-09 MED ORDER — OMEPRAZOLE 40 MG PO CPDR: 40.0000 mg | DELAYED_RELEASE_CAPSULE | Freq: Every day | ORAL | 1 refills | Status: AC

## 2024-03-09 MED ORDER — IRON 325 (65 FE) MG PO TABS: 1.0000 | ORAL_TABLET | Freq: Every day | ORAL | 1 refills | Status: AC

## 2024-03-09 MED ORDER — ATORVASTATIN CALCIUM 80 MG PO TABS: 80.0000 mg | ORAL_TABLET | Freq: Every day | ORAL | 1 refills | Status: AC

## 2024-03-09 MED ORDER — BUDESONIDE-FORMOTEROL FUMARATE 160-4.5 MCG/ACT IN AERO: INHALATION_SPRAY | RESPIRATORY_TRACT | 1 refills | Status: DC

## 2024-03-09 MED ORDER — FENOFIBRATE MICRONIZED 134 MG PO CAPS: ORAL_CAPSULE | ORAL | 1 refills | Status: AC

## 2024-03-09 MED ORDER — TIZANIDINE HCL 4 MG PO TABS: 4.0000 mg | ORAL_TABLET | Freq: Every day | ORAL | 1 refills | Status: AC

## 2024-03-09 MED ORDER — AMLODIPINE BESYLATE 10 MG PO TABS: 10.0000 mg | ORAL_TABLET | Freq: Every day | ORAL | 1 refills | Status: AC
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
## 2024-03-09 NOTE — Patient Instructions (Signed)

## 2024-03-10 ENCOUNTER — Ambulatory Visit: Attending: Cardiology | Admitting: Cardiology

## 2024-03-10 ENCOUNTER — Ambulatory Visit: Payer: Self-pay | Admitting: Nurse Practitioner

## 2024-03-10 ENCOUNTER — Encounter: Payer: Self-pay | Admitting: Cardiology

## 2024-03-10 ENCOUNTER — Telehealth: Payer: Self-pay

## 2024-03-10 VITALS — BP 140/58 | HR 80 | Resp 14 | Ht 61.0 in | Wt 121.8 lb

## 2024-03-10 DIAGNOSIS — Z720 Tobacco use: Secondary | ICD-10-CM

## 2024-03-10 DIAGNOSIS — R911 Solitary pulmonary nodule: Secondary | ICD-10-CM | POA: Diagnosis not present

## 2024-03-10 DIAGNOSIS — I25118 Atherosclerotic heart disease of native coronary artery with other forms of angina pectoris: Secondary | ICD-10-CM | POA: Diagnosis not present

## 2024-03-10 DIAGNOSIS — I739 Peripheral vascular disease, unspecified: Secondary | ICD-10-CM | POA: Diagnosis not present

## 2024-03-10 LAB — CBC WITH DIFFERENTIAL/PLATELET
Basophils Absolute: 0 x10E3/uL (ref 0.0–0.2)
Basos: 0 %
EOS (ABSOLUTE): 0.1 x10E3/uL (ref 0.0–0.4)
Eos: 1 %
Hematocrit: 44 % (ref 34.0–46.6)
Hemoglobin: 14.2 g/dL (ref 11.1–15.9)
Immature Grans (Abs): 0 x10E3/uL (ref 0.0–0.1)
Immature Granulocytes: 0 %
Lymphocytes Absolute: 3.8 x10E3/uL — ABNORMAL HIGH (ref 0.7–3.1)
Lymphs: 39 %
MCH: 29.9 pg (ref 26.6–33.0)
MCHC: 32.3 g/dL (ref 31.5–35.7)
MCV: 93 fL (ref 79–97)
Monocytes Absolute: 0.5 x10E3/uL (ref 0.1–0.9)
Monocytes: 5 %
Neutrophils Absolute: 5.3 x10E3/uL (ref 1.4–7.0)
Neutrophils: 55 %
Platelets: 399 x10E3/uL (ref 150–450)
RBC: 4.75 x10E6/uL (ref 3.77–5.28)
RDW: 12.6 % (ref 11.7–15.4)
WBC: 9.8 x10E3/uL (ref 3.4–10.8)

## 2024-03-10 LAB — CMP14+EGFR
ALT: 17 IU/L (ref 0–32)
AST: 21 IU/L (ref 0–40)
Albumin: 4.4 g/dL (ref 3.8–4.8)
Alkaline Phosphatase: 55 IU/L (ref 49–135)
BUN/Creatinine Ratio: 15 (ref 12–28)
BUN: 12 mg/dL (ref 8–27)
Bilirubin Total: 0.2 mg/dL (ref 0.0–1.2)
CO2: 23 mmol/L (ref 20–29)
Calcium: 9.8 mg/dL (ref 8.7–10.3)
Chloride: 101 mmol/L (ref 96–106)
Creatinine, Ser: 0.8 mg/dL (ref 0.57–1.00)
Globulin, Total: 1.9 g/dL (ref 1.5–4.5)
Glucose: 110 mg/dL — AB (ref 70–99)
Potassium: 4.1 mmol/L (ref 3.5–5.2)
Sodium: 137 mmol/L (ref 134–144)
Total Protein: 6.3 g/dL (ref 6.0–8.5)
eGFR: 78 mL/min/1.73 (ref >59)

## 2024-03-10 LAB — LIPID PANEL
Cholesterol, Total: 141 mg/dL (ref 100–199)
HDL: 66 mg/dL (ref >39)
LDL CALC COMMENT:: 2.1 ratio (ref 0.0–4.4)
LDL Chol Calc (NIH): 61 mg/dL (ref 0–99)
Triglycerides: 70 mg/dL (ref 0–149)
VLDL Cholesterol Cal: 14 mg/dL (ref 5–40)

## 2024-03-10 NOTE — Telephone Encounter (Signed)
 Pt came to 4th floor after appt with Dr. Jordan. Per patient, she is wanting to follow Dr. Gib recommendation and f/u w/ oncology before meeting with [facility]. Pt requesting to r/s for [facility] in [place]. New appt scheduled for 05/09/24 in [place]

## 2024-03-10 NOTE — Patient Instructions (Addendum)

## 2024-03-13 MED ORDER — FLUTICASONE FUROATE-VILANTEROL 200-25 MCG/ACT IN AEPB: 1.0000 | INHALATION_SPRAY | Freq: Every day | RESPIRATORY_TRACT | 11 refills | Status: AC

## 2024-03-13 NOTE — Telephone Encounter (Signed)
 Copied from CRM #[id number]. Topic: Clinical - Medication Question >> Mar 13, 2024 10:57 AM Delon HERO wrote: Reason for CRM: Patient is calling to report that budesonide -formoterol  (SYMBICORT ) 160-4.5 MCG/ACT inhaler [[id number]] is not approved under insurance. Brio is approved under insurance. Please send to  [facility] - [place], [place] - [address] [place]  [id number] Phone: [phone] Fax: [phone] Hours: Not open 24 hours

## 2024-03-13 NOTE — Telephone Encounter (Signed)
 Changed symbocort to BREO  Meds ordered this encounter  Medications   fluticasone  furoate-vilanterol (BREO ELLIPTA) 200-25 MCG/ACT AEPB    Sig: Inhale 1 puff into the lungs daily.    Dispense:  1 each    Refill:  11    Supervising Provider:   MARYANNE CHEW A [id number]   Mary-Margaret Gladis, FNP

## 2024-03-14 ENCOUNTER — Ambulatory Visit: Admitting: Vascular Surgery

## 2024-03-29 DIAGNOSIS — M9901 Segmental and somatic dysfunction of cervical region: Secondary | ICD-10-CM | POA: Diagnosis not present

## 2024-03-29 DIAGNOSIS — M62830 Muscle spasm of back: Secondary | ICD-10-CM | POA: Diagnosis not present

## 2024-03-29 DIAGNOSIS — M9902 Segmental and somatic dysfunction of thoracic region: Secondary | ICD-10-CM | POA: Diagnosis not present

## 2024-03-29 DIAGNOSIS — M9903 Segmental and somatic dysfunction of lumbar region: Secondary | ICD-10-CM | POA: Diagnosis not present

## 2024-05-01 DIAGNOSIS — M9901 Segmental and somatic dysfunction of cervical region: Secondary | ICD-10-CM | POA: Diagnosis not present

## 2024-05-01 DIAGNOSIS — M9902 Segmental and somatic dysfunction of thoracic region: Secondary | ICD-10-CM | POA: Diagnosis not present

## 2024-05-01 DIAGNOSIS — M9903 Segmental and somatic dysfunction of lumbar region: Secondary | ICD-10-CM | POA: Diagnosis not present

## 2024-05-01 DIAGNOSIS — M62830 Muscle spasm of back: Secondary | ICD-10-CM | POA: Diagnosis not present

## 2024-05-09 ENCOUNTER — Ambulatory Visit (INDEPENDENT_AMBULATORY_CARE_PROVIDER_SITE_OTHER): Admitting: Vascular Surgery

## 2024-05-09 ENCOUNTER — Encounter: Payer: Self-pay | Admitting: Vascular Surgery

## 2024-05-09 VITALS — BP 175/77 | HR 65 | Temp 97.6°F | Resp 20 | Ht 61.0 in | Wt 120.8 lb

## 2024-05-09 DIAGNOSIS — I7409 Other arterial embolism and thrombosis of abdominal aorta: Secondary | ICD-10-CM | POA: Diagnosis not present

## 2024-05-09 NOTE — Progress Notes (Unsigned)
 "  Established Patient Pulmonology Office Visit   Subjective:  Patient ID: Karla Maxwell, female    DOB: 1951-04-14  MRN: [id number]  CC: No chief complaint on file.   HPI  [name] is a 74 y/o F with a PMH significant for nicotine  dependence with current use, CAD, PAD, emphysema, and LUL solitary pulmonary nodule. The patient is here for LUL enlarging nodule.  Symptoms Associated with Lung cancer:   {Central Tumor Sx:33645}  {Peripheral Tumor Sx:33646}  {Sx of Metastasis:33647}  {Conditions associated with lung cancer & imp to identify prior to bronch:33648}  {STOPBANG:33649}  {Hx of Anesthesia reactions:33650}  PMH:   Important Medications:   Allergies:   Social History:  {Smoking and Biomass Fuel Exposure:33651}  {Occupational Exposures:33652}  {Military Specific Exposures:33653}  Family History: {Cancer-related QY:66345}  ASA grade:  {ASA GRADE:110003}  Karnofsky Performance Status: {Karnofsky Performance Status:33655}  ECOG Performance Status: {findings; ecog performance status:31780}   {PULM QUESTIONNAIRES (Optional):33196}  ROS  {History (Optional):23778} Current Medications[1]      Objective:  There were no vitals taken for this visit. {Pulm Vitals (Optional):32837}  Physical Exam   Diagnostic Review:  {Labs (Optional):32838}  LDCT 12/16/2022: IMPRESSION: 1. Suspicious left upper lobe solid nodule is increased in size. Lung-RADS 4B, suspicious. Additional imaging evaluation or consultation with Pulmonology or Thoracic Surgery recommended. 2. Coronary artery calcifications, aortic Atherosclerosis (ICD10-I70.0) and Emphysema (ICD10-J43.9).  CT Chest 01/2024: IMPRESSION: 1. Enlarging spiculated nodule within the left apex, now measuring 11 x 12 mm, compatible with an enlarging bronchogenic neoplasm. 2. Mild emphysema. 3. Extensive atherosclerotic calcification within the thoracic aorta. No aortic aneurysm.    Assessment &  Plan:   Assessment & Plan Nodule of upper lobe of left lung   No orders of the defined types were placed in this encounter.     No follow-ups on file.   Marvelene Stoneberg, MD     [1]  Current Outpatient Medications:    acetaminophen  (TYLENOL ) 650 MG CR tablet, Take 1,300 mg by mouth every 8 (eight) hours as needed for pain., Disp: , Rfl:    amLODipine  (NORVASC ) 10 MG tablet, Take 1 tablet (10 mg total) by mouth daily., Disp: 90 tablet, Rfl: 1   atorvastatin  (LIPITOR) 80 MG tablet, Take 1 tablet (80 mg total) by mouth daily., Disp: 90 tablet, Rfl: 1   carboxymethylcellulose (REFRESH PLUS) 0.5 % SOLN, Place 1 drop into both eyes 3 (three) times daily as needed (dry eyes)., Disp: , Rfl:    diphenoxylate -atropine  (LOMOTIL ) 2.5-0.025 MG tablet, Take 1 tablet by mouth 4 (four) times daily as needed for diarrhea or loose stools. Take if >3 bowel movements per day, Disp: 120 tablet, Rfl: 0   fenofibrate  micronized (LOFIBRA) 134 MG capsule, TAKE 1 CAPSULE BY MOUTH ONCE DAILY BEFORE BREAKFAST ., Disp: 90 capsule, Rfl: 1   Ferrous Sulfate  (IRON ) 325 (65 Fe) MG TABS, Take 1 tablet (325 mg total) by mouth daily with breakfast., Disp: 90 tablet, Rfl: 1   fluticasone  furoate-vilanterol (BREO ELLIPTA ) 200-25 MCG/ACT AEPB, Inhale 1 puff into the lungs daily., Disp: 1 each, Rfl: 11   levothyroxine  (SYNTHROID ) 75 MCG tablet, Take 1 tablet (75 mcg total) by mouth daily before breakfast., Disp: 90 tablet, Rfl: 1   loratadine  (EQ ALL DAY ALLERGY RELIEF) 10 MG tablet, Take 1 tablet (10 mg total) by mouth daily., Disp: 90 tablet, Rfl: 1   montelukast  (SINGULAIR ) 10 MG tablet, Take 1 tablet (10 mg total) by mouth daily with breakfast., Disp: 90 tablet,  Rfl: 1   omeprazole  (PRILOSEC) 40 MG capsule, Take 1 capsule (40 mg total) by mouth daily., Disp: 90 capsule, Rfl: 1   tiZANidine  (ZANAFLEX ) 4 MG tablet, Take 1 tablet (4 mg total) by mouth at bedtime., Disp: 90 tablet, Rfl: 1  "
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
## 2024-05-10 ENCOUNTER — Ambulatory Visit: Admitting: Pulmonary Disease

## 2024-05-10 ENCOUNTER — Encounter: Payer: Self-pay | Admitting: Pulmonary Disease

## 2024-05-10 VITALS — BP 155/67 | HR 64 | Temp 98.4°F | Ht 61.0 in | Wt 118.0 lb

## 2024-05-10 DIAGNOSIS — J432 Centrilobular emphysema: Secondary | ICD-10-CM | POA: Diagnosis not present

## 2024-05-10 DIAGNOSIS — L989 Disorder of the skin and subcutaneous tissue, unspecified: Secondary | ICD-10-CM

## 2024-05-10 DIAGNOSIS — R911 Solitary pulmonary nodule: Secondary | ICD-10-CM

## 2024-05-10 NOTE — Patient Instructions (Addendum)
" °  VISIT SUMMARY: You visited us  today to evaluate the growth of a nodule in your left upper lung. We discussed your concerns about invasive procedures and reviewed your current medications and health history. We also addressed a skin lesion that may require further evaluation.  YOUR PLAN: LEFT UPPER LOBE PULMONARY NODULE: The nodule in your left upper lung has doubled in size over the past year and is suspected to be stage I lung cancer. -A PET scan has been ordered to assess for malignancy. -If the PET scan is positive, you will be referred to radiation oncology for potential radiation therapy without a biopsy.  EMPHYSEMA: You have a history of emphysema likely related to past smoking. Your Symbicort  inhaler helps manage your symptoms. -Continue using Symbicort  as prescribed, two puffs in the morning and two in the evening. -A pulmonary function test was performed to assess your lung function.  SKIN LESION: -You have been referred to dermatology for evaluation and management of the skin lesion.  Contains text generated by [employer].  "

## 2024-05-11 ENCOUNTER — Telehealth: Payer: Self-pay | Admitting: Pulmonary Disease

## 2024-05-11 ENCOUNTER — Other Ambulatory Visit: Payer: Self-pay | Admitting: *Deleted

## 2024-05-11 DIAGNOSIS — I7409 Other arterial embolism and thrombosis of abdominal aorta: Secondary | ICD-10-CM

## 2024-05-11 NOTE — Telephone Encounter (Signed)
 Spoke with patient regarding the Tuesday 06/13/24 2:00 pm PFT appointment at [facility] time is 1:45 pm--1st floor registration desk for check in--will mail information to patient and she voiced her understanding---I asked if she wanted to reschedule the PET scan that she cancelled on 05/25/24 and she stated she does not want to have this done.

## 2024-05-25 ENCOUNTER — Other Ambulatory Visit (HOSPITAL_COMMUNITY)

## 2024-05-31 ENCOUNTER — Other Ambulatory Visit: Payer: Self-pay | Admitting: Nurse Practitioner

## 2024-05-31 DIAGNOSIS — J4489 Other specified chronic obstructive pulmonary disease: Secondary | ICD-10-CM

## 2024-06-01 ENCOUNTER — Ambulatory Visit (HOSPITAL_COMMUNITY)
Admission: RE | Admit: 2024-06-01 | Discharge: 2024-06-01 | Disposition: A | Discharge status: Home / Self Care | Source: Ambulatory Visit | Attending: Pulmonary Disease | Admitting: Pulmonary Disease

## 2024-06-01 DIAGNOSIS — R911 Solitary pulmonary nodule: Secondary | ICD-10-CM | POA: Diagnosis present

## 2024-06-01 MED ORDER — FLUDEOXYGLUCOSE F - 18 (FDG) INJECTION
6.1900 | Freq: Once | INTRAVENOUS | Status: AC | PRN
  Administered 2024-06-01: 6.19 via INTRAVENOUS

## 2024-06-07 ENCOUNTER — Ambulatory Visit: Payer: Self-pay | Admitting: Pulmonary Disease

## 2024-06-07 DIAGNOSIS — R911 Solitary pulmonary nodule: Secondary | ICD-10-CM

## 2024-06-08 ENCOUNTER — Telehealth: Payer: Self-pay | Admitting: Radiation Oncology

## 2024-06-08 NOTE — Progress Notes (Signed)
 I was able to reach patient today. Her LUL solitary pulmonary nodule had a max SUV of 6.9 suggestive of malignancy. She is still adamant on NOT getting biopsy or surgery for it. I think the ONLY option that could potentially be considered is SBRT. I will submit a referral to radiation oncology and I routed this note to Dr. Shannon to see what he thinks regarding candidacy for SBRT. Thank you so much in advance.

## 2024-06-08 NOTE — Telephone Encounter (Signed)
LVM to schedule consult with Dr. Kinard °

## 2024-06-13 ENCOUNTER — Ambulatory Visit (HOSPITAL_COMMUNITY)

## 2024-06-15 ENCOUNTER — Ambulatory Visit: Admitting: Pulmonary Disease

## 2024-09-04 ENCOUNTER — Ambulatory Visit: Admitting: Nurse Practitioner

## 2024-11-07 ENCOUNTER — Encounter

## 2024-11-07 ENCOUNTER — Ambulatory Visit: Admitting: Vascular Surgery
